# Patient Record
Sex: Female | Born: 1955 | Race: White | Hispanic: No | Smoking: Former smoker
Health system: Southern US, Community
[De-identification: ages and names within clinical notes are randomized; demographics above are authoritative.]

## PROBLEM LIST (undated history)

## (undated) DIAGNOSIS — I639 Cerebral infarction, unspecified: Secondary | ICD-10-CM

## (undated) HISTORY — PX: CHOLECYSTECTOMY: SHX55

---

## 2019-08-09 ENCOUNTER — Inpatient Hospital Stay (HOSPITAL_COMMUNITY): Payer: Self-pay

## 2019-08-09 ENCOUNTER — Inpatient Hospital Stay (HOSPITAL_COMMUNITY)
Admission: AD | Admit: 2019-08-09 | Discharge: 2019-08-12 | DRG: 065 | Disposition: A | Discharge status: Rehabilitation Facility | Payer: Self-pay | Source: Other Acute Inpatient Hospital | Attending: Neurology | Admitting: Neurology

## 2019-08-09 DIAGNOSIS — I959 Hypotension, unspecified: Secondary | ICD-10-CM | POA: Diagnosis present

## 2019-08-09 DIAGNOSIS — I6389 Other cerebral infarction: Principal | ICD-10-CM | POA: Diagnosis present

## 2019-08-09 DIAGNOSIS — W19XXXA Unspecified fall, initial encounter: Secondary | ICD-10-CM | POA: Diagnosis present

## 2019-08-09 DIAGNOSIS — K920 Hematemesis: Secondary | ICD-10-CM | POA: Diagnosis present

## 2019-08-09 DIAGNOSIS — IMO0002 Reserved for concepts with insufficient information to code with codable children: Secondary | ICD-10-CM | POA: Diagnosis present

## 2019-08-09 DIAGNOSIS — S7001XA Contusion of right hip, initial encounter: Secondary | ICD-10-CM | POA: Diagnosis present

## 2019-08-09 DIAGNOSIS — S7011XA Contusion of right thigh, initial encounter: Secondary | ICD-10-CM | POA: Diagnosis present

## 2019-08-09 DIAGNOSIS — G8191 Hemiplegia, unspecified affecting right dominant side: Secondary | ICD-10-CM | POA: Diagnosis present

## 2019-08-09 DIAGNOSIS — I10 Essential (primary) hypertension: Secondary | ICD-10-CM | POA: Diagnosis present

## 2019-08-09 DIAGNOSIS — Z9282 Status post administration of tPA (rtPA) in a different facility within the last 24 hours prior to admission to current facility: Secondary | ICD-10-CM

## 2019-08-09 DIAGNOSIS — Z8673 Personal history of transient ischemic attack (TIA), and cerebral infarction without residual deficits: Secondary | ICD-10-CM

## 2019-08-09 DIAGNOSIS — Z20828 Contact with and (suspected) exposure to other viral communicable diseases: Secondary | ICD-10-CM | POA: Diagnosis present

## 2019-08-09 DIAGNOSIS — E669 Obesity, unspecified: Secondary | ICD-10-CM | POA: Diagnosis present

## 2019-08-09 DIAGNOSIS — Z880 Allergy status to penicillin: Secondary | ICD-10-CM

## 2019-08-09 DIAGNOSIS — Y92009 Unspecified place in unspecified non-institutional (private) residence as the place of occurrence of the external cause: Secondary | ICD-10-CM

## 2019-08-09 DIAGNOSIS — Z23 Encounter for immunization: Secondary | ICD-10-CM

## 2019-08-09 DIAGNOSIS — M1990 Unspecified osteoarthritis, unspecified site: Secondary | ICD-10-CM | POA: Diagnosis present

## 2019-08-09 DIAGNOSIS — Z79899 Other long term (current) drug therapy: Secondary | ICD-10-CM

## 2019-08-09 DIAGNOSIS — Z7982 Long term (current) use of aspirin: Secondary | ICD-10-CM

## 2019-08-09 DIAGNOSIS — R29704 NIHSS score 4: Secondary | ICD-10-CM | POA: Diagnosis present

## 2019-08-09 DIAGNOSIS — I6623 Occlusion and stenosis of bilateral posterior cerebral arteries: Secondary | ICD-10-CM | POA: Diagnosis present

## 2019-08-09 DIAGNOSIS — Z9119 Patient's noncompliance with other medical treatment and regimen: Secondary | ICD-10-CM

## 2019-08-09 DIAGNOSIS — R509 Fever, unspecified: Secondary | ICD-10-CM

## 2019-08-09 DIAGNOSIS — Z6829 Body mass index (BMI) 29.0-29.9, adult: Secondary | ICD-10-CM

## 2019-08-09 DIAGNOSIS — E785 Hyperlipidemia, unspecified: Secondary | ICD-10-CM | POA: Diagnosis present

## 2019-08-09 DIAGNOSIS — E1165 Type 2 diabetes mellitus with hyperglycemia: Secondary | ICD-10-CM | POA: Diagnosis present

## 2019-08-09 DIAGNOSIS — I639 Cerebral infarction, unspecified: Secondary | ICD-10-CM | POA: Diagnosis present

## 2019-08-09 DIAGNOSIS — Z9114 Patient's other noncompliance with medication regimen: Secondary | ICD-10-CM

## 2019-08-09 DIAGNOSIS — R471 Dysarthria and anarthria: Secondary | ICD-10-CM | POA: Diagnosis present

## 2019-08-09 DIAGNOSIS — E0865 Diabetes mellitus due to underlying condition with hyperglycemia: Secondary | ICD-10-CM

## 2019-08-09 DIAGNOSIS — R29706 NIHSS score 6: Secondary | ICD-10-CM | POA: Diagnosis present

## 2019-08-09 LAB — CBC
HCT: 34 % — ABNORMAL LOW (ref 36.0–46.0)
Hemoglobin: 11.9 g/dL — ABNORMAL LOW (ref 12.0–15.0)
MCH: 29.5 pg (ref 26.0–34.0)
MCHC: 35 g/dL (ref 30.0–36.0)
MCV: 84.4 fL (ref 80.0–100.0)
Platelets: 185 10*3/uL (ref 150–400)
RBC: 4.03 MIL/uL (ref 3.87–5.11)
RDW: 12.2 % (ref 11.5–15.5)
WBC: 10.9 10*3/uL — ABNORMAL HIGH (ref 4.0–10.5)
nRBC: 0 % (ref 0.0–0.2)

## 2019-08-09 LAB — COMPREHENSIVE METABOLIC PANEL
ALT: 18 U/L (ref 0–44)
AST: 16 U/L (ref 15–41)
Albumin: 3 g/dL — ABNORMAL LOW (ref 3.5–5.0)
Alkaline Phosphatase: 65 U/L (ref 38–126)
Anion gap: 10 (ref 5–15)
BUN: 19 mg/dL (ref 8–23)
CO2: 24 mmol/L (ref 22–32)
Calcium: 8.3 mg/dL — ABNORMAL LOW (ref 8.9–10.3)
Chloride: 101 mmol/L (ref 98–111)
Creatinine, Ser: 0.66 mg/dL (ref 0.44–1.00)
GFR calc Af Amer: >60 mL/min (ref >60)
GFR calc non Af Amer: >60 mL/min (ref >60)
Glucose, Bld: 250 mg/dL — ABNORMAL HIGH (ref 70–99)
Potassium: 3.7 mmol/L (ref 3.5–5.1)
Sodium: 135 mmol/L (ref 135–145)
Total Bilirubin: 0.9 mg/dL (ref 0.3–1.2)
Total Protein: 5.7 g/dL — ABNORMAL LOW (ref 6.5–8.1)

## 2019-08-09 LAB — URINALYSIS, ROUTINE W REFLEX MICROSCOPIC
Bilirubin Urine: NEGATIVE
Glucose, UA: >=500 mg/dL — AB
Hgb urine dipstick: NEGATIVE
Ketones, ur: NEGATIVE mg/dL
Leukocytes,Ua: NEGATIVE
Nitrite: NEGATIVE
Protein, ur: NEGATIVE mg/dL
Specific Gravity, Urine: 1.023 (ref 1.005–1.030)
pH: 5 (ref 5.0–8.0)

## 2019-08-09 LAB — ECHOCARDIOGRAM COMPLETE
Height: 63 in
Weight: 2701.96 oz

## 2019-08-09 LAB — MRSA PCR SCREENING: MRSA by PCR: NEGATIVE

## 2019-08-09 LAB — GLUCOSE, CAPILLARY
Glucose-Capillary: 268 mg/dL — ABNORMAL HIGH (ref 70–99)
Glucose-Capillary: 257 mg/dL — ABNORMAL HIGH (ref 70–99)
Glucose-Capillary: 287 mg/dL — ABNORMAL HIGH (ref 70–99)
Glucose-Capillary: 231 mg/dL — ABNORMAL HIGH (ref 70–99)
Glucose-Capillary: 320 mg/dL — ABNORMAL HIGH (ref 70–99)

## 2019-08-09 LAB — HEMOGLOBIN AND HEMATOCRIT, BLOOD
HCT: 38.5 % (ref 36.0–46.0)
HCT: 36.5 % (ref 36.0–46.0)
Hemoglobin: 13.5 g/dL (ref 12.0–15.0)
Hemoglobin: 12.4 g/dL (ref 12.0–15.0)

## 2019-08-09 LAB — HEMOGLOBIN A1C
Hgb A1c MFr Bld: 11.8 % — ABNORMAL HIGH (ref 4.8–5.6)
Mean Plasma Glucose: 291.96 mg/dL

## 2019-08-09 LAB — LIPID PANEL
Cholesterol: 147 mg/dL (ref 0–200)
HDL: 28 mg/dL — ABNORMAL LOW (ref >40)
LDL Cholesterol: 103 mg/dL — ABNORMAL HIGH (ref 0–99)
Total CHOL/HDL Ratio: 5.3 RATIO
Triglycerides: 82 mg/dL (ref <150)
VLDL: 16 mg/dL (ref 0–40)

## 2019-08-09 LAB — HIV ANTIBODY (ROUTINE TESTING W REFLEX): HIV Screen 4th Generation wRfx: NONREACTIVE

## 2019-08-09 LAB — SARS CORONAVIRUS 2 (TAT 6-24 HRS): SARS Coronavirus 2: NEGATIVE

## 2019-08-09 IMAGING — MR MR HEAD W/O CM
12 of 14 series · 37 of 48 positions shown · non-contrast
Comparison: None.

CLINICAL DATA: Focal neuro deficit with stroke suspected

EXAM:
MRI HEAD WITHOUT CONTRAST
TECHNIQUE: Multiplanar, multiecho pulse sequences of the brain and surrounding
structures were obtained without intravenous contrast.

[Series 5: DWI · axial · 3.0mm · 0.88mm/px · z∈[-96,+60]mm · 7 of 108 slices shown (1 of 4)]
[im 1/108]
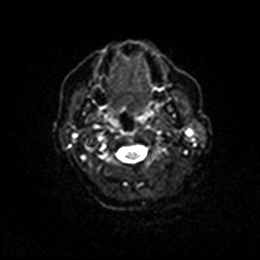
[im 18/108]
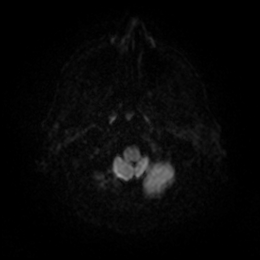
[im 36/108]
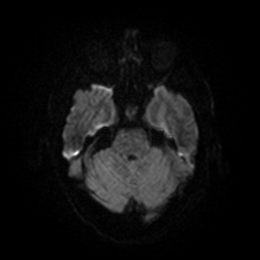
[im 54/108]
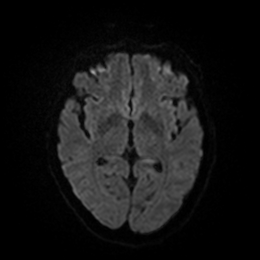
[im 72/108]
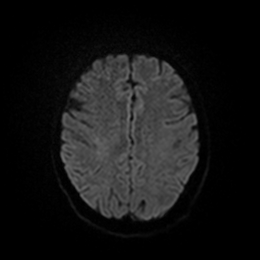
[im 90/108]
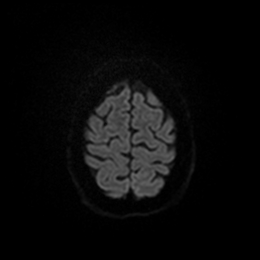
[im 108/108]
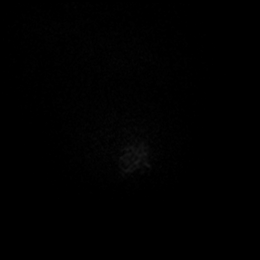

[Series 6: DWI · axial · 3.0mm · 0.88mm/px · z∈[-96,+60]mm · 3 of 54 slices shown (2 of 4)]
[im 1/54]
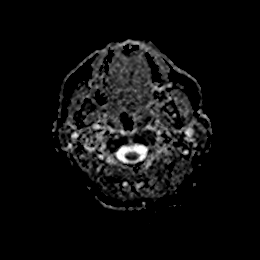
[im 27/54]
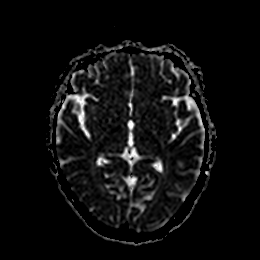
[im 54/54]
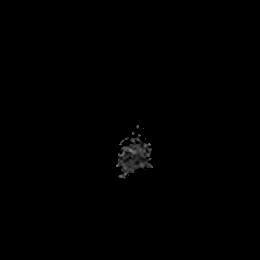

[Series 7: DWI · coronal · 4.0mm · 0.88mm/px · 4 of 72 slices shown (3 of 4)]
[im 1/72]
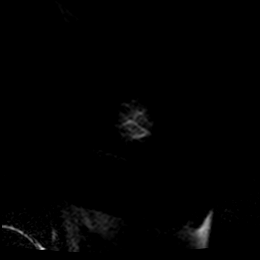
[im 24/72]
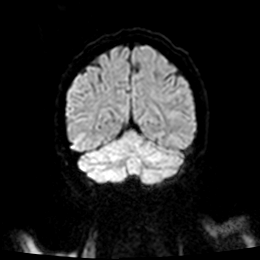
[im 48/72]
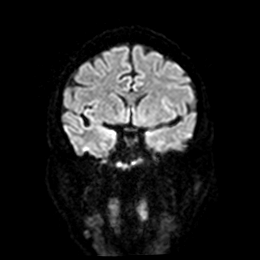
[im 72/72]
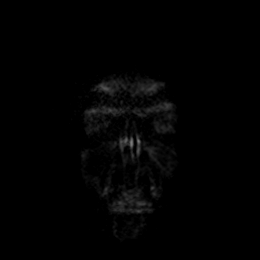

[Series 8: DWI · coronal · 4.0mm · 0.88mm/px · 2 of 36 slices shown (4 of 4)]
[im 1/36]
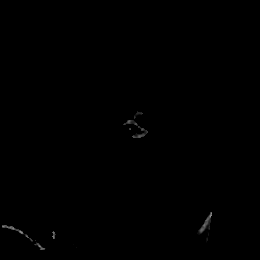
[im 36/36]
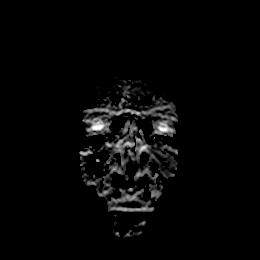

[Series 9: T1 · sagittal · 5.0mm · 0.75mm/px · 1 of 23 slices shown]
[im 1/23]
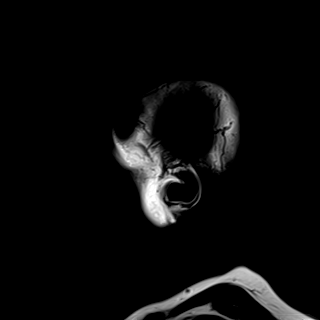

[Series 10: T2 · axial · 5.0mm · 0.72mm/px · z∈[-94,+47]mm · 2 of 25 slices shown (1 of 2)]
[im 1/25]
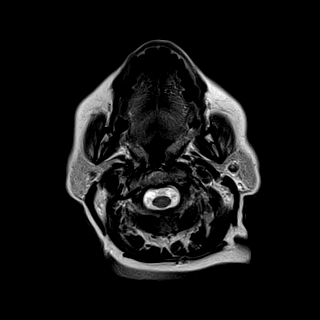
[im 25/25]
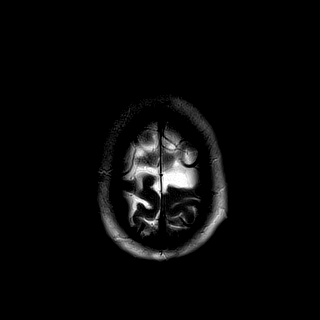

[Series 11: FLAIR · axial · 5.0mm · 0.45mm/px · z∈[-96,+46]mm · 2 of 25 slices shown]
[im 1/25]
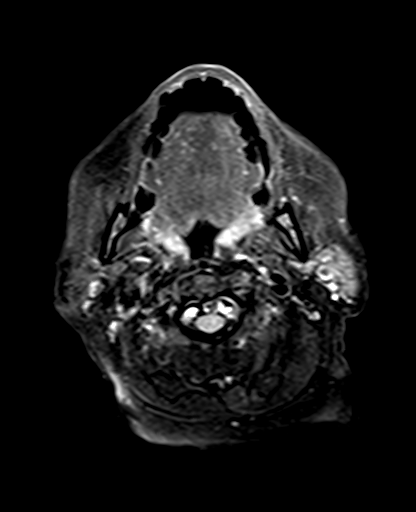
[im 25/25]
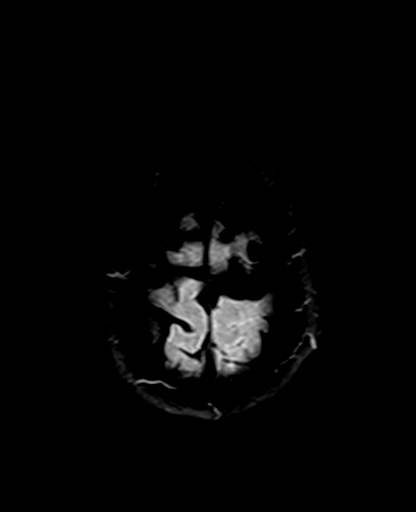

[Series 12: mag_images · axial · 3.0mm · 0.90mm/px · z∈[-100,+74]mm · 4 of 60 slices shown]
[im 1/60]
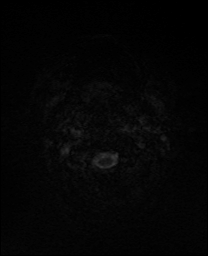
[im 20/60]
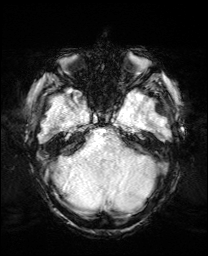
[im 40/60]
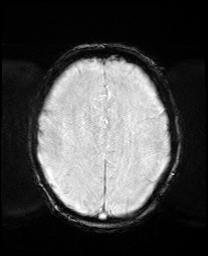
[im 60/60]
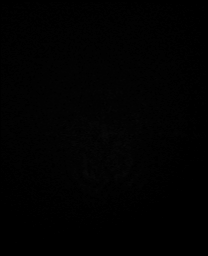

[Series 13: pha_images · axial · 3.0mm · 0.90mm/px · z∈[-100,+65]mm · 3 of 56 slices shown]
[im 1/56]
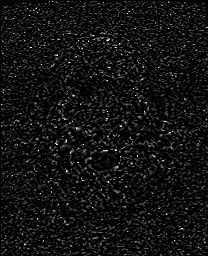
[im 28/56]
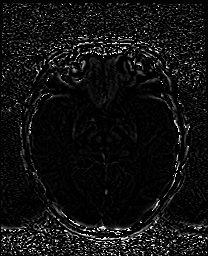
[im 56/56]
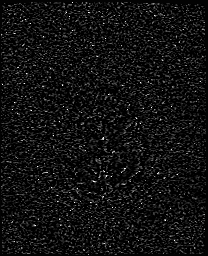

[Series 14: swi_images · axial · 3.0mm · 0.90mm/px · z∈[-100,+74]mm · 4 of 60 slices shown]
[im 1/60]
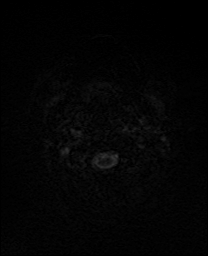
[im 20/60]
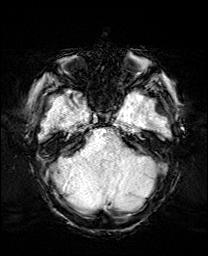
[im 40/60]
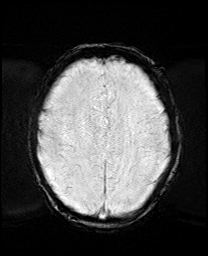
[im 60/60]
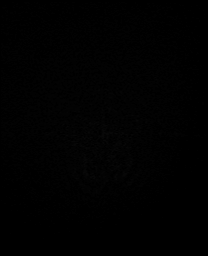

[Series 15: mip_images(sw) · axial · 24.0mm · 0.90mm/px · z∈[-89,+64]mm · 3 of 53 slices shown]
[im 1/53]
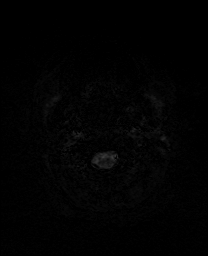
[im 27/53]
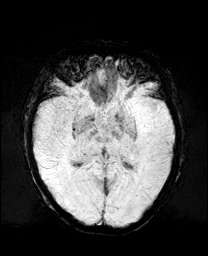
[im 53/53]
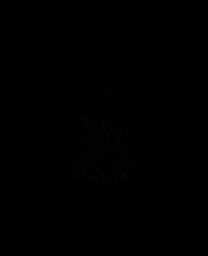

[Series 17: T2 · coronal · 5.0mm · 0.34mm/px · 2 of 29 slices shown (2 of 2)]
[im 1/29]
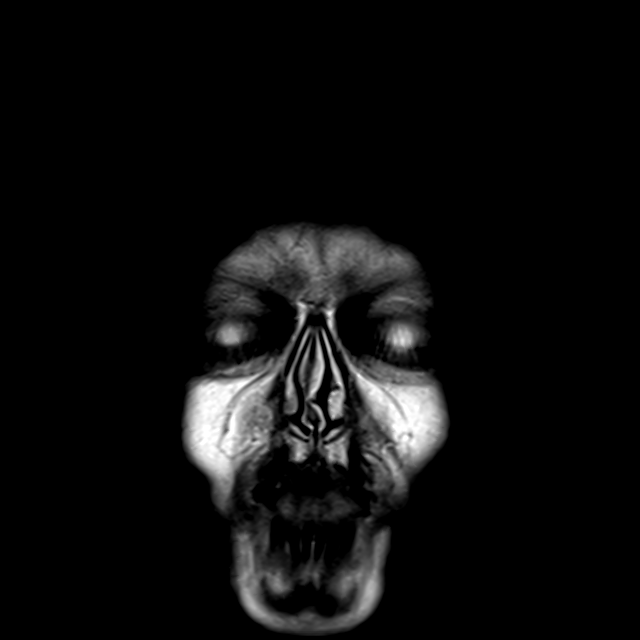
[im 29/29]
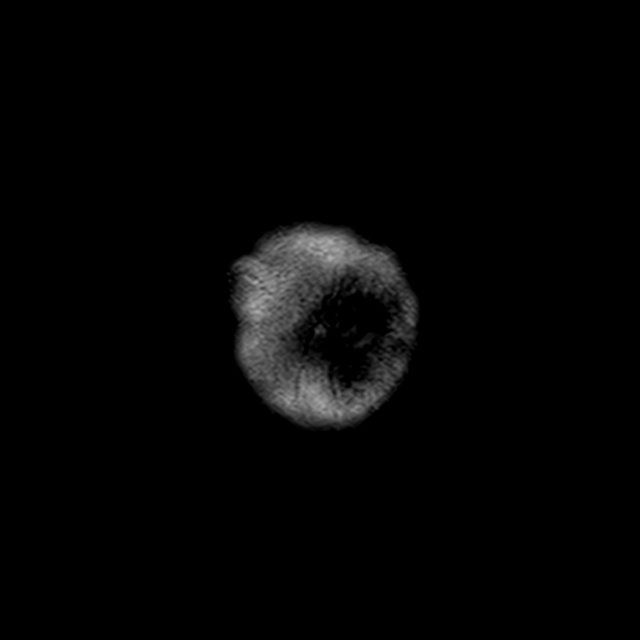

[37 of 48 positions shown; findings below may reference images not displayed]

FINDINGS: Brain: Wedge of restricted diffusion in the left pons consistent
with acute infarct. Remote lacunar infarct in the right frontal
parietal white matter with peripheral facilitated diffusion. Brain
volume is normal. No hemorrhage, hydrocephalus, or masslike finding.

Vascular: Normal flow voids

Skull and upper cervical spine: Normal marrow signal. Cervical facet
spurring with C4-5 facet ankylosis.

Sinuses/Orbits: Normal
IMPRESSION: 1. Left pontine acute perforator infarction.
2. Remote lacunar infarct in the right cerebral white matter.

## 2019-08-09 IMAGING — DX DG CHEST 1V PORT
1 series · 1 of 1 positions shown · non-contrast
Comparison: None.

CLINICAL DATA: History of stroke.  Low-grade fever

EXAM:
PORTABLE CHEST 1 VIEW

[chest]
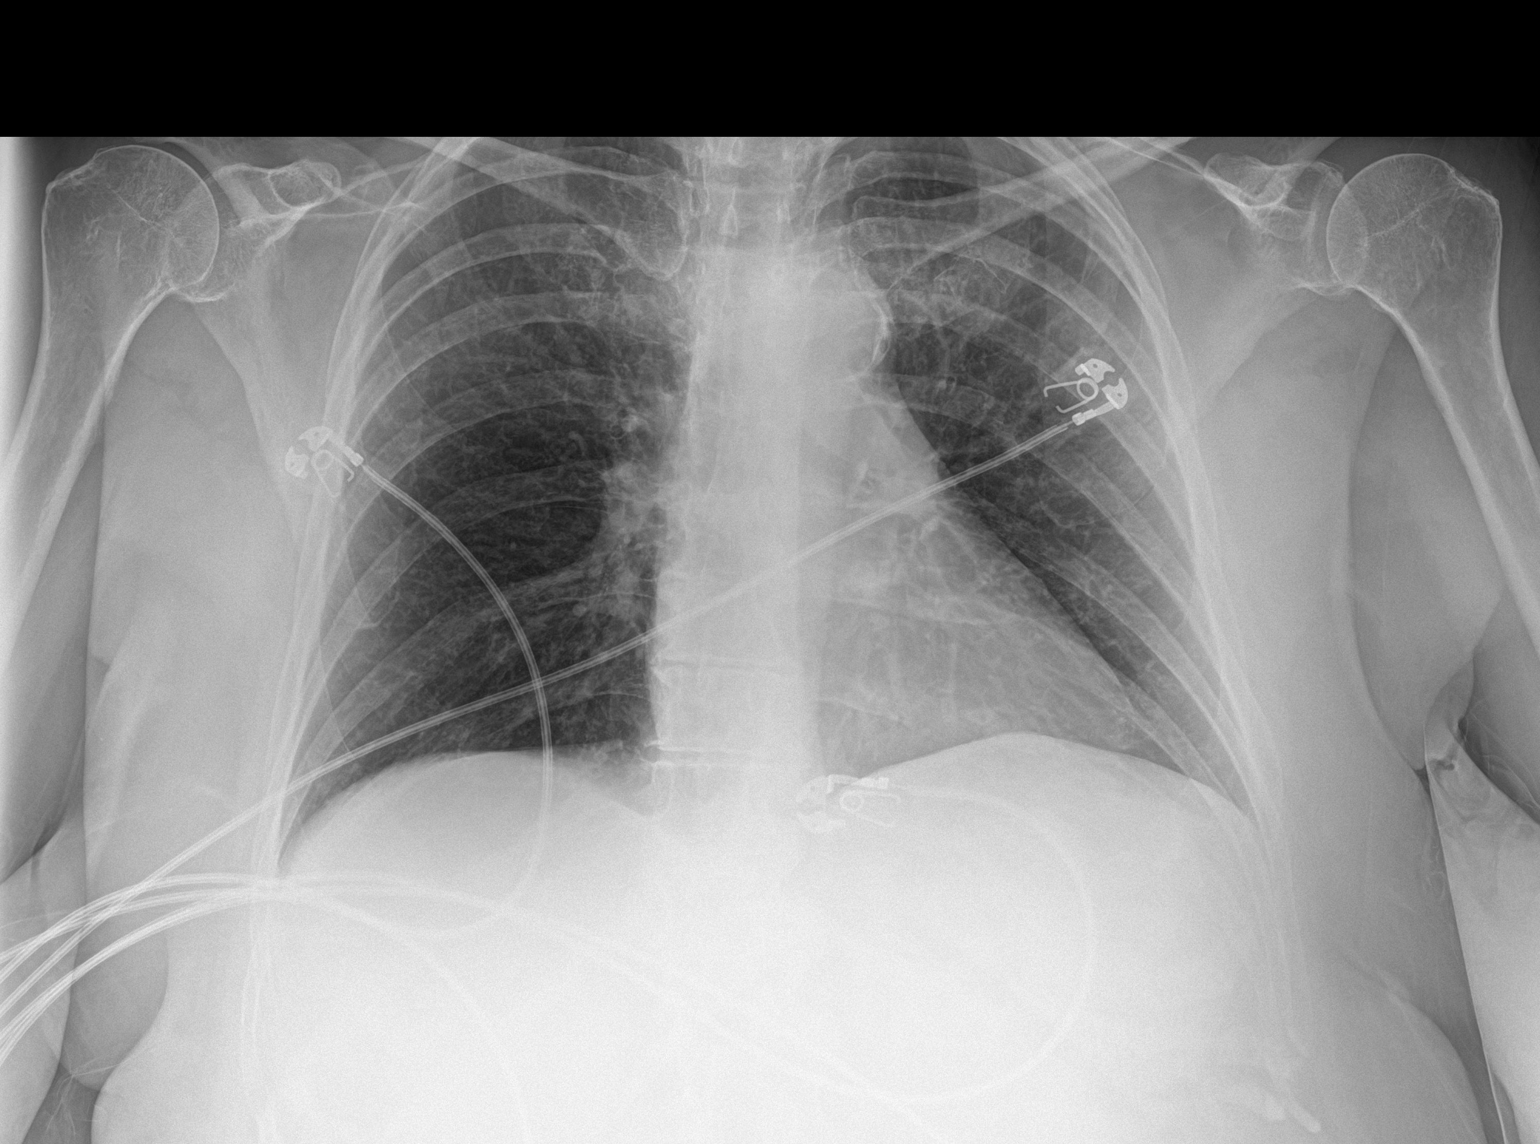

[1 of 1 positions shown; findings below may reference images not displayed]

FINDINGS: Normal heart size. Negative mediastinal contours other than
atherosclerotic calcification. There is no edema, consolidation,
effusion, or pneumothorax. Artifact from EKG leads.
IMPRESSION: No evidence of active disease.

## 2019-08-09 MED ORDER — CLOPIDOGREL BISULFATE 75 MG PO TABS
75.0000 mg | ORAL_TABLET | Freq: Every day | ORAL | Status: DC
  Administered 2019-08-09 – 2019-08-12 (×4): 75 mg via ORAL
  Filled 2019-08-09 (×4): qty 1

## 2019-08-09 MED ORDER — CHLORHEXIDINE GLUCONATE CLOTH 2 % EX PADS
6.0000 | MEDICATED_PAD | Freq: Every day | CUTANEOUS | Status: DC
  Administered 2019-08-09 – 2019-08-12 (×5): 6 via TOPICAL

## 2019-08-09 MED ORDER — PNEUMOCOCCAL VAC POLYVALENT 25 MCG/0.5ML IJ INJ
0.5000 mL | INJECTION | INTRAMUSCULAR | Status: AC
  Administered 2019-08-10: 0.5 mL via INTRAMUSCULAR
  Filled 2019-08-09: qty 0.5

## 2019-08-09 MED ORDER — ACETAMINOPHEN 650 MG RE SUPP: 650.0000 mg | RECTAL | Status: DC | PRN

## 2019-08-09 MED ORDER — ORAL CARE MOUTH RINSE: 15.0000 mL | Freq: Two times a day (BID) | OROMUCOSAL | Status: DC

## 2019-08-09 MED ORDER — INSULIN ASPART 100 UNIT/ML ~~LOC~~ SOLN
5.0000 [IU] | SUBCUTANEOUS | Status: AC
  Administered 2019-08-09: 22:00:00 5 [IU] via SUBCUTANEOUS

## 2019-08-09 MED ORDER — ACETAMINOPHEN 325 MG PO TABS
650.0000 mg | ORAL_TABLET | ORAL | Status: DC | PRN
  Administered 2019-08-09 – 2019-08-11 (×2): 650 mg via ORAL
  Filled 2019-08-09 (×2): qty 2

## 2019-08-09 MED ORDER — INSULIN ASPART 100 UNIT/ML ~~LOC~~ SOLN
0.0000 [IU] | Freq: Three times a day (TID) | SUBCUTANEOUS | Status: DC
  Administered 2019-08-09: 18:00:00 3 [IU] via SUBCUTANEOUS
  Administered 2019-08-09 (×2): 5 [IU] via SUBCUTANEOUS
  Administered 2019-08-10: 3 [IU] via SUBCUTANEOUS

## 2019-08-09 MED ORDER — SODIUM CHLORIDE 0.9 % IV SOLN
INTRAVENOUS | Status: DC
  Administered 2019-08-09 – 2019-08-10 (×2): via INTRAVENOUS

## 2019-08-09 MED ORDER — ASPIRIN 81 MG PO CHEW
81.0000 mg | CHEWABLE_TABLET | Freq: Every day | ORAL | Status: DC
  Administered 2019-08-09 – 2019-08-12 (×4): 81 mg via ORAL
  Filled 2019-08-09 (×4): qty 1

## 2019-08-09 MED ORDER — STROKE: EARLY STAGES OF RECOVERY BOOK
Freq: Once | Status: AC
  Administered 2019-08-09: 02:00:00
  Filled 2019-08-09: qty 1

## 2019-08-09 MED ORDER — ACETAMINOPHEN 160 MG/5ML PO SOLN: 650.0000 mg | ORAL | Status: DC | PRN

## 2019-08-09 MED ORDER — CHLORHEXIDINE GLUCONATE 0.12 % MT SOLN
15.0000 mL | Freq: Two times a day (BID) | OROMUCOSAL | Status: DC
  Administered 2019-08-09: 02:00:00 15 mL via OROMUCOSAL

## 2019-08-09 MED ORDER — SODIUM CHLORIDE 0.9 % IV BOLUS
250.0000 mL | Freq: Once | INTRAVENOUS | Status: AC
  Administered 2019-08-09: 08:00:00 250 mL via INTRAVENOUS

## 2019-08-09 MED ORDER — PANTOPRAZOLE SODIUM 40 MG IV SOLR
40.0000 mg | Freq: Every day | INTRAVENOUS | Status: DC
  Administered 2019-08-09: 22:00:00 40 mg via INTRAVENOUS
  Filled 2019-08-09: qty 40

## 2019-08-09 NOTE — Progress Notes (Signed)
Patient arrived via [facility] to room [phone]. Patient's nurse from [facility] was with the patient and TPA bag was paused but still attached to the patient. TPA bolus initially started at 2218, and then the infusion was started at 2223. The dosing was supposed to be 40mL/hr. Upon investigation at 0023, the paused pump for TPA rate was set for 6.8 mL/hr. Oval Linsey RN mentioned pausing the TPA because patient started vomiting blood and a hematoma formed on Right hip. Hematoma formed around 2300 and was marked to be about 1 inch X 1 inch, TPA was paused the first time at 2301. Oval Linsey MD was made aware and verbally told Lynnell Catalan to continue giving TPA. Patient then became nauseous and was given zofran at 2340. TPA was paused again at 0003 because patient began vomiting large amounts of blood. The hematoma was assessed and marked again at this time by the [facility] and was noted to have drastically grown larger.   [facility], Dr. Lorraine Lax, was paged when patient arrived onto the floor. Spoke with him about the events that occurred before arriving to the unit, verbal order to not continue what was left of the TPA (which was about 20 mL total in the IV chamber and line) because it should have been finished an hour from starting anyway.   0030- hematoma marked again by [facility] RN and [employer] together.

## 2019-08-09 NOTE — Progress Notes (Signed)
Paged Neuro MD about patient's bedtime blood glucose of 320 to see if bedtime coverage would be needed. New order to give 5 units of novalog insulin one time now. Will continue to monitor.

## 2019-08-09 NOTE — Progress Notes (Signed)
  Echocardiogram 2D Echocardiogram has been performed.  Jacqueline Leach 08/09/2019, 11:47 AM

## 2019-08-09 NOTE — Evaluation (Signed)
Clinical/Bedside Swallow Evaluation Patient Details  Name: Jacqueline Leach MRN: [id number] Date of Birth: 12/05/1957  Today's Date: 08/09/2019 Time: SLP Start Time (ACUTE ONLY): 0815 SLP Stop Time (ACUTE ONLY): 0825 SLP Time Calculation (min) (ACUTE ONLY): 10 min  Past Medical History: No past medical history on file. Past Surgical History:  HPI:  Jacqueline Leach is a 63 y.o. female with past medical history significant for arthritis, diabetes mellitus noncompliant with her medications, possibly hypertension presents to the emergency department at [facility] with sudden onset weakness and slurred speech resulting in her falling at 6:30 PM on 08/08/19.  PEr chart, tPA was not able to be fully administered, MRI pending.    Assessment / Plan / Recommendation Clinical Impression  Pt presents with appropriate oropharyngeal abilities. She consumed puree, solids and thin liquids without overt s/s of aspiration or dysphagia. Although pt does demonstrate very mild facial weakness on right, pt with good sensation and is appropriate for return to regular diet with thin liquids, medicine whole. ST to sign off for dysphagia.  SLP Visit Diagnosis: Dysphagia, unspecified (R13.10)    Aspiration Risk  No limitations    Diet Recommendation Regular;Thin liquid   Liquid Administration via: Cup;Straw Medication Administration: Whole meds with liquid Supervision: Patient able to self feed Compensations: Slow rate;Small sips/bites Postural Changes: Seated upright at 90 degrees    Other  Recommendations Oral Care Recommendations: Oral care BID   Follow up Recommendations None      Frequency and Duration   N/A         Prognosis   N/A     Swallow Study   General Date of Onset: 08/09/19 HPI: Jacqueline Leach is a 63 y.o. female with past medical history significant for arthritis, diabetes mellitus noncompliant with her medications, possibly hypertension presents to the emergency department at  [facility] with sudden onset weakness and slurred speech resulting in her falling at 6:30 PM on 08/08/19.  PEr chart, tPA was not able to be fully administered, MRI pending.  Type of Study: Bedside Swallow Evaluation Previous Swallow Assessment: none in chart Diet Prior to this Study: NPO Temperature Spikes Noted: No Respiratory Status: Room air History of Recent Intubation: No Behavior/Cognition: Alert;Cooperative;Pleasant mood Oral Cavity Assessment: Within Functional Limits Oral Care Completed by SLP: Recent completion by staff Oral Cavity - Dentition: Adequate natural dentition Vision: Functional for self-feeding Self-Feeding Abilities: Able to feed self Patient Positioning: Upright in bed Baseline Vocal Quality: Normal Volitional Cough: Strong Volitional Swallow: Able to elicit    Oral/Motor/Sensory Function Overall Oral Motor/Sensory Function: Within functional limits   Ice Chips Ice chips: Not tested   Thin Liquid Thin Liquid: Within functional limits Presentation: Cup;Self Fed;Straw    Nectar Thick Nectar Thick Liquid: Not tested   Honey Thick Honey Thick Liquid: Not tested   Puree Puree: Within functional limits Presentation: Self Fed;Spoon   Solid     Solid: Within functional limits Presentation: Odell 08/09/2019,12:14 PM

## 2019-08-09 NOTE — Evaluation (Signed)
Occupational Therapy Evaluation Patient Details Name: Jacqueline Leach MRN: [id number] DOB: 12/05/1957 Today's Date: 08/09/2019    History of Present Illness 63 y.o. female with past medical history significant for arthritis, diabetes mellitus noncompliant with her medications, possibly hypertension presented to [facility] ED with sudden onset weakness and slurred speech resulting in her falling at 6:30 PM on 08/08/19. tPA given however, instead of infusion being given 62 mL/h TPA rate was set for 6.8 ml/hr, and so patient still had not completed TPA on arrival to [facility].  In addition patient had some hematemesis as well as hematoma on the right leg and therefore TPA was held. CT not ordered, MRI pending Left hemispheric subcortical infarct   Clinical Impression   Pt PTA: pt living at home independently with 2 daughters and grandchildren. Pt still working. Pt currently limited by decreased strength, decreased mobility and decreased ability to care for self. Pt very limited on R side; but L side remains strong to perform transfers. 1/5 MM grade for shoulder elevation minimal and minimal shoulder retraction. emotional lability, very upset over change in function. Pt minA to [place] +2 for sit to stand and stand or squat pivot transfers. Pt modA to maxA for ADL as RUE nearly flaccid and her dominant side. Pt would greatly benefit from continued OT skilled services for ADL, mobility and safety In CIR setting. Pt highly motivated to return to PLOF. OT following acutely.    Follow Up Recommendations  CIR    Equipment Recommendations  3 in 1 bedside commode;Tub/shower seat    Recommendations for Other Services Rehab consult     Precautions / Restrictions Precautions Precautions: Fall Precaution Comments: right hemiparesis Restrictions Weight Bearing Restrictions: No      Mobility Bed Mobility Overal bed mobility: Needs Assistance Bed Mobility: Supine to Sit     Supine to sit: Mod  assist     General bed mobility comments: mod assist to move RLE toward EOB and fully elevate trunk with use of rail and pivot to left  Transfers Overall transfer level: Needs assistance   Transfers: Sit to/from Stand;Stand Pivot Transfers;Squat Pivot Transfers Sit to Stand: Min assist;+2 safety/equipment Stand pivot transfers: Mod assist;+2 safety/equipment;+2 physical assistance Squat pivot transfers: Min assist;Mod assist     General transfer comment: initial stand from bed with RLE blocked pt able to rise with min assist with +2 for safety x 2 trials. Mod +2 assist to stand pivot toward left from bed to chair with physical assist to block RLE and to advance RLE. squat pivot from recliner to [facility] toward left with min assist with cues for sequence. squat pivot to right BSC to recliner with mod assist +2 for safety with physical assist to move RLE to complete pivot    Balance Overall balance assessment: Needs assistance   Sitting balance-Leahy Scale: Good Sitting balance - Comments: good sitting balance EOB     Standing balance-Leahy Scale: Poor Standing balance comment: BUE assist for standing task                           ADL either performed or assessed with clinical judgement   ADL Overall ADL's : Needs assistance/impaired Eating/Feeding: Moderate assistance;Bed level;Cueing for safety Eating/Feeding Details (indicate cue type and reason): using LUE Grooming: Moderate assistance;Sitting;Cueing for safety   Upper Body Bathing: Moderate assistance;Sitting;Cueing for safety   Lower Body Bathing: Maximal assistance;Cueing for safety;Cueing for sequencing;Sitting/lateral leans;Sit to/from stand   Upper Body  Dressing : Moderate assistance;Sitting   Lower Body Dressing: Maximal assistance;Sitting/lateral leans;Sit to/from stand   Toilet Transfer: Minimal assistance;Moderate assistance;+2 for physical assistance;+2 for safety/equipment;Stand-pivot;Squat-pivot;Cueing  for sequencing;Cueing for safety;BSC   Toileting- Clothing Manipulation and Hygiene: Maximal assistance;+2 for physical assistance;+2 for safety/equipment;Sitting/lateral lean;Sit to/from stand;Cueing for safety;Cueing for sequencing       Functional mobility during ADLs: Minimal assistance;Moderate assistance;+2 for physical assistance;+2 for safety/equipment;Cueing for safety;Cueing for sequencing General ADL Comments: Pt limited by decreased strength, decreased mobility and decreased ability to care for self. Pt very limited on R side; but L side remains strong to perform transfers.     Vision Baseline Vision/History: No visual deficits Patient Visual Report: No change from baseline Vision Assessment?: Yes Eye Alignment: Within Functional Limits Ocular Range of Motion: Within Functional Limits Alignment/Gaze Preference: Within Defined Limits     Perception     Praxis      Pertinent Vitals/Pain Pain Assessment: 0-10 Pain Score: 4  Pain Location: hemtoma spot Pain Descriptors / Indicators: Discomfort Pain Intervention(s): Monitored during session;Repositioned     Hand Dominance Right   Extremity/Trunk Assessment Upper Extremity Assessment Upper Extremity Assessment: Generalized weakness;RUE deficits/detail RUE Deficits / Details: 1/5 MM grade for shoulder elevation minimal and minimal shoulder retraction RUE Coordination: decreased fine motor;decreased gross motor   Lower Extremity Assessment Lower Extremity Assessment: Defer to PT evaluation;Generalized weakness RLE Deficits / Details: hip flexion 1/5, knee extension 2/5, knee flexion 2/5, dorsiflexion 1/5, no toe movement   Cervical / Trunk Assessment Cervical / Trunk Assessment: Normal   Communication Communication Communication: Expressive difficulties(increased time for speech)   Cognition Arousal/Alertness: Awake/alert Behavior During Therapy: WFL for tasks assessed/performed Overall Cognitive Status: Within  Functional Limits for tasks assessed                                 General Comments: emotional lability, very upset over change in function   General Comments       Exercises Exercises: Other exercises Other Exercises Other Exercises: RUE P/AAROM (LUE guiding RUE) shoulder through digits; shoulder flex, elbow flex/ext, digit flex/ext.   Shoulder Instructions      Home Living Family/patient expects to be discharged to:: Private residence Living Arrangements: Children Available Help at Discharge: Family;Available 24 hours/day Type of Home: House Home Access: Stairs to enter [employer] of Steps: 2-3 Entrance Stairs-Rails: None Home Layout: One level     Bathroom Shower/Tub: Occupational psychologist: Standard     Home Equipment: Shower seat - built in;Cane - single point      Lives With: Family;Daughter    Prior Functioning/Environment Level of Independence: Independent        Comments: Driving, IADLs, ADLs, cleaning; retired as Merchandiser, retail; pt enjoys reading and enjoys Pharmacist, community Problem List: Decreased strength;Decreased range of motion;Decreased activity tolerance;Impaired balance (sitting and/or standing);Decreased cognition;Decreased coordination;Decreased safety awareness;Impaired UE functional use;Pain      OT Treatment/Interventions: Self-care/ADL training;Therapeutic exercise;Neuromuscular education;Energy conservation;Therapeutic activities;Patient/family education;Balance training    OT Goals(Current goals can be found in the care plan section) Acute Rehab OT Goals Patient Stated Goal: return home, read, work in the flowers OT Goal Formulation: With patient Time For Goal Achievement: 08/23/19 Potential to Achieve Goals: Good ADL Goals Pt Will Perform Eating: with set-up;sitting Pt Will Perform Grooming: with set-up;sitting Pt Will Perform Lower Body Dressing: with min assist;sit to/from stand Pt Will  Transfer to Toilet: with min guard assist;stand pivot transfer;bedside commode Pt Will Perform Toileting - Clothing Manipulation and hygiene: with min assist;sitting/lateral leans;sit to/from stand Pt/caregiver will Perform Home Exercise Program: Increased strength;Right Upper extremity;Increased ROM;With minimal assist  OT Frequency: Min 3X/week   Barriers to D/C:            Co-evaluation PT/OT/SLP Co-Evaluation/Treatment: Yes Reason for Co-Treatment: Complexity of the patient's impairments (multi-system involvement);To address functional/ADL transfers   OT goals addressed during session: ADL's and self-care      AM-PAC OT "6 Clicks" Daily Activity     Outcome Measure Help from another person eating meals?: A Lot Help from another person taking care of personal grooming?: A Lot Help from another person toileting, which includes using toliet, bedpan, or urinal?: Total Help from another person bathing (including washing, rinsing, drying)?: A Lot Help from another person to put on and taking off regular upper body clothing?: A Lot Help from another person to put on and taking off regular lower body clothing?: A Lot 6 Click Score: 11   End of Session Equipment Utilized During Treatment: Gait belt Nurse Communication: Mobility status  Activity Tolerance: Patient tolerated treatment well Patient left: in chair;with call bell/phone within reach;with chair alarm set  OT Visit Diagnosis: Unsteadiness on feet (R26.81);Muscle weakness (generalized) (M62.81);Hemiplegia and hemiparesis;Other symptoms and signs involving cognitive function Hemiplegia - Right/Left: Right Hemiplegia - dominant/non-dominant: Dominant Hemiplegia - caused by: Unspecified                Time: [id number] OT Time Calculation (min): 28 min Charges:  OT General Charges $OT Visit: 1 Visit OT Evaluation $OT Eval High Complexity: 1 High  Darryl Nestle) Marsa Aris OTR/L Acute Rehabilitation Services Pager:  [phone] Office: [phone]   Audie Pinto 08/09/2019, 3:57 PM

## 2019-08-09 NOTE — Progress Notes (Signed)
STROKE TEAM PROGRESS NOTE   HISTORY OF PRESENT ILLNESS (per record) Jacqueline Leach is a 63 y.o. female with past medical history significant for arthritis, diabetes mellitus noncompliant with her medications, possibly hypertension presents to the emergency department at [facility] with sudden onset weakness and slurred speech resulting in her falling at 6:30 PM on 08/08/19.  She was evaluated by EDP and tele-neurology and decision was made to give TPA.  Her NIH stroke scale was 4 for mild right upper extremity weakness and right lower weakness and dysarthria.  However, instead of infusion being given 62 mL/h TPA rate was set for 6.8 ml/hr, and so patient still had not completed TPA on arrival to [facility].  In addition patient had some hematemesis as well as hematoma on the right leg and therefore TPA was held.  CT angiogram was performed as well which showed bilateral PCA stenosis but no large vessel occlusion.  On arrival to [facility], patient's right upper extremity weakness was worse.  Also hematoma in the right leg was larger in size. Patient appeared Alert and oriented and did not appear in distress.  Remaining TPA just approximately 20 mL was thrown out as she is well outside the window to receive TPA at this time.  Date last known well: 11.26.20 Time last known well: 6:30 PM tPA Given: Yes NIHSS: 4 ( 6 on arrival)  Baseline MRS 0   INTERVAL HISTORY I have personally reviewed history of presenting illness with the patient, electronic medical records and imaging films in PACS.  Patient was started on IV TPA at [facility] but developed hematemesis and hence it was discontinued and she got about two thirds the dose.  She also developed right hip hematoma at the site where she had fallen and the onset of her stroke.  Hematoma appears to be stable and shrinking in size overnight.  Her blood pressure has been adequately controlled.  She continues to have significant right  hemiplegia though her dysarthria and slurred speech have improved.    OBJECTIVE Vitals:   08/09/19 0630 08/09/19 0700 08/09/19 0728 08/09/19 0730  BP: (!) 97/54 (!) 77/58 (!) 85/70 99/81  Pulse: 77 88 81 89  Resp: 16 (!) 21 20 17   Temp:      TempSrc:      SpO2: 93% 95% 94% 98%  Weight:      Height:        CBC:  Recent Labs  Lab 08/09/19 0115 08/09/19 0503  HGB 13.5 12.4  HCT 38.5 [id number]    Basic Metabolic Panel: No results for input(s): NA, K, CL, CO2, GLUCOSE, BUN, CREATININE, CALCIUM, MG, PHOS in the last 168 hours.  Lipid Panel:     Component Value Date/Time   CHOL 147 08/09/2019 0503   TRIG 82 08/09/2019 0503   HDL 28 (L) 08/09/2019 0503   CHOLHDL 5.3 08/09/2019 0503   VLDL 16 08/09/2019 0503   LDLCALC 103 (H) 08/09/2019 0503   HgbA1c:  Lab Results  Component Value Date   HGBA1C 11.8 (H) 08/09/2019   Urine Drug Screen: No results found for: LABOPIA, COCAINSCRNUR, LABBENZ, AMPHETMU, THCU, LABBARB  Alcohol Level No results found for: Paint  MRI Head WO Contrast - pending  No results found.   Transthoracic Echocardiogram  00/00/2020 Pending   ECG - pending   PHYSICAL EXAM Blood pressure 99/81, pulse 89, temperature 98 F (36.7 C), temperature source Oral, resp. rate 17, height 5\' 3"  (1.6 m), weight 76.6 kg, SpO2  98 %. Pleasant frail middle-aged Caucasian lady not in distress. . Afebrile. Head is nontraumatic. Neck is supple without bruit.    Cardiac exam no murmur or gallop. Lungs are clear to auscultation. Distal pulses are well felt.  Neurological Exam :  She is awake alert oriented to time place and person.  There is mild dysarthria but no aphasia or apraxia.  Extraocular movements are full range without nystagmus.  Visual fields are full to bedside confrontational testing.  There is mild right lower facial weakness.  Tongue midline.  There is dense right hemiplegia with only trace movement noted in the right leg.  Sensation appears intact  bilaterally.  Tone is increased on the right compared to the left.  Right plantars upgoing left is downgoing.  Gait not tested.  NIH stroke scale 9  ASSESSMENT/PLAN [name] is a 63 y.o. female with history of arthritis, diabetes mellitus noncompliant with her medications, possibly hypertension presents to the emergency department at [facility] with sudden onset right sided weakness and slurred speech resulting in her falling at 6:30 PM on 08/08/19. TPA at [facility] per tele-neurology - D/C'd [facility] due to some hematemesis as well as hematoma on the right leg.   Stroke: Left hemispheric subcortical infarct treated with IV TPA at outside hospital received incomplete dosing due to inaccurate infusion schedule and hematemesis MRI pending  Resultant right hemiplegia  Code Stroke CT Head - not ordered  CT head - not ordered  MRI head - pending  MRA head - not ordered  CTA Head - OSH - bilateral PCA stenosis but no large vessel occlusion.  CT Perfusion - not ordered  Carotid Doppler - pending  2D Echo - pending  Sars Corona Virus 2 - negative  LDL - 103  HgbA1c - 11.8  UDS - not ordered  VTE prophylaxis - SCDs Diet  Diet Order            Diet NPO time specified  Diet effective now              No antithrombotic prior to admission, now on No antithrombotic s/p tPA - hematemesis  Patient will be counseled to be compliant with her antithrombotic medications  Ongoing aggressive stroke risk factor management  Therapy recommendations:  pending  Disposition:  Pending  Hematemesis after tPA  Hemoglobin - 12.4 at 5 AM - stat repeat - pending  Protonix 40 mg IV QD  Hematoma RLE after tPA  Hemoglobin - 12.4 at 5 AM - stat repeat - pending  Hypotension  250 cc NS bolus - for BP 99/81  Hypertension  Home BP meds: none documented  Current BP meds: none   BP running low -> 250 cc NS bolus . Permissive hypertension (OK if < 220/120)  but gradually normalize in 5-7 days   . Long-term BP goal normotensive  Hyperlipidemia  Home Lipid lowering medication: none documented  LDL 103, goal < 70  Current lipid lowering medication: none - NPO - start statin when appropriate  Continue statin at discharge  Diabetes  Home diabetic meds: none documented  Current diabetic meds: SSI   HgbA1c 11.8, goal < 7.0 Recent Labs    08/09/19 0635  GLUCAP 268*    Other Stroke Risk Factors  Advanced age  Cigarette smoker - not on file  ETOH use - not on file  Obesity, Body mass index is 29.91 kg/m., recommend weight loss, diet and exercise as appropriate   Family hx stroke -  not on file  Medical non compliance  Substance Abuse - not on file  Other Active Problems  Medical non compliance    Hospital day # 0  I have personally obtained history,examined this patient, reviewed notes, independently viewed imaging studies, participated in medical decision making and plan of care.ROS completed by me personally and pertinent positives fully documented  I have made any additions or clarifications directly to the above note.  She presented with right hemiplegia and dysarthria secondary to left brain subcortical infarct in was started on IV TPA at [facility] but due to inaccurate calculation of administration schedule did not receive the full dose also developed hematemesis hence TPA was held.  She has remained stable overnight with improvement in slurred speech to right hemiplegia may have worsened.  Blood pressure adequately controlled.  Check MRI scan of the brain later today.  Continue close neurological monitoring and strict blood pressure control as per post TPA protocol.  Continue ongoing stroke work-up.  Physical occupational therapy and rehab consults. This patient is critically ill and at significant risk of neurological worsening, death and care requires constant monitoring of vital signs, hemodynamics,respiratory  and cardiac monitoring, extensive review of multiple databases, frequent neurological assessment, discussion with family, other specialists and medical decision making of high complexity.I have made any additions or clarifications directly to the above note.This critical care time does not reflect procedure time, or teaching time or supervisory time of PA/NP/Med Resident etc but could involve care discussion time.  I spent 30 minutes of neurocritical care time  in the care of  this patient.      Antony Contras, MD Medical Director [facility] Pager: [phone] 08/09/2019 11:36 AM   To contact Stroke Continuity provider, please refer to [email or web address] After hours, contact General Neurology

## 2019-08-09 NOTE — H&P (Signed)
Chief Complaint: right side weakness  History obtained from: Patient and Chart     HPI:                                                                                                                                       Jacqueline Leach is a 63 y.o. female with past medical history significant for arthritis, diabetes mellitus noncompliant with her medications, possibly hypertension presents to the emergency department at [facility] with sudden onset weakness and slurred speech resulting in her falling at 6:30 PM on 08/08/19.  She was evaluated by EDP and tele-neurology and decision was made to give TPA.  Her NIH stroke scale was 4 for mild right upper extremity weakness and right lower weakness and dysarthria.  However, instead of infusion being given 62 mL/h TPA rate was set for 6.8 ml/hr, and so patient still had not completed TPA on arrival to [facility].  In addition patient had some hematemesis as well as hematoma on the right leg and therefore TPA was held.  CT angiogram was performed as well which showed bilateral PCA stenosis but no large vessel occlusion.  On arrival to [facility], patient's right upper extremity weakness was worse.  Also hematoma in the right leg was larger in size. Patient appeared Alert and oriented and did not appear in distress.  Remaining TPA just approximately 20 mL was thrown out as she is well outside the window to receive TPA at this time.  Date last known well: 11.26.20 Time last known well: 6:30 PM tPA Given: Yes NIHSS: 4 ( 6 on arrival)  Baseline MRS 0   Past medical history Arthritis Diabetes mellitus History of gallbladder surgery  No family history on file.  No family history of strokes at young age Social History:  has no history on file for tobacco, alcohol, and drug.  Allergies: Not on File  Medications:                                                                                                                        I  reviewed home medications. None, occasional ASA for arthritis   ROS:  14 systems reviewed and negative except above    Examination:                                                                                                      General: Appears well-developed . Psych: Affect appropriate to situation Eyes: No scleral injection HENT: No OP obstrucion Head: Normocephalic.  Cardiovascular: Normal rate and regular rhythm. Respiratory: Effort normal and breath sounds normal to anterior ascultation GI: Soft.  No distension. There is no tenderness.  Skin: WDI    Neurological Examination Mental Status: Alert, oriented, thought content appropriate.  Speech fluent without evidence of aphasia. Able to follow 3 step commands without difficulty. Cranial Nerves: II: Visual fields grossly normal,  III,IV, VI: ptosis not present, extra-ocular motions intact bilaterally, pupils equal, round, reactive to light and accommodation V,VII: smile symmetric, facial light touch sensation normal bilaterally VIII: hearing normal bilaterally IX,X: uvula rises symmetrically XI: bilateral shoulder shrug XII: midline tongue extension Motor: Right : Upper extremity   2/5    Left:     Upper extremity   5/5  Lower extremity   3/5     Lower extremity   5/5 Tone and bulk:normal tone throughout; no atrophy noted Sensory: Pinprick and light touch intact throughout, bilaterally Deep Tendon Reflexes: 2+ and symmetric throughout Plantars: Right: downgoing   Left: downgoing Cerebellar: normal finger-to-nose on the left side  Gait: Not assessed     Lab Results: Basic Metabolic Panel: No results for input(s): NA, K, CL, CO2, GLUCOSE, BUN, CREATININE, CALCIUM, MG, PHOS in the last 168 hours.  CBC: Recent Labs  Lab 08/09/19 0115 08/09/19 0503  HGB 13.5 12.4  HCT 38.5 36.5     Coagulation Studies: No results for input(s): LABPROT, INR in the last 72 hours.  Imaging: No results found.   ASSESSMENT AND PLAN    Acute Ischemic Stroke   # MRI of the brain without contrast #Transthoracic Echo  #Hold antiplatelets until 24 hours after TPA if no hemorrhage seen on CT/MRI #Start or continue Atorvastatin 40 mg/other high intensity statin after swallow eval # BP goal: permissive HTN upto 180/105  mmHg # HBAIC and Lipid profile # Telemetry monitoring # Frequent neuro checks # stroke swallow screen  Diabetes mellitus HbAIC ordered SS insulin ordered  Right leg hematoma - appears stable, will trend H and H. Will reverse only if increasing in size, Hb drops  This patient is neurologically critically ill due to   He is at risk for significant risk of neurological worsening from cerebral edema,  death from brain herniation, heart failure, hemorrhagic conversion, infection, respiratory failure and seizure. This patient's care requires constant monitoring of vital signs, hemodynamics, respiratory and cardiac monitoring, review of multiple databases, neurological assessment, discussion with family, other specialists and medical decision making of high complexity.  I spent 50 minutes of neurocritical time in the care of this patient.      Please page stroke NP  Or  PA  Or MD from 8am -4 pm  as this patient from this time will be  followed by  the stroke.   You can look them up on [email or web address]  Password [facility]    Wilfredo Canterbury [facility] Pager Number [phone]

## 2019-08-09 NOTE — Evaluation (Signed)
Speech Language Pathology Evaluation Patient Details Name: Jacqueline Leach MRN: [id number] DOB: 12/05/1957 Today's Date: 08/09/2019 Time: [id number] SLP Time Calculation (min) (ACUTE ONLY): 15 min  Problem List:  Patient Active Problem List   Diagnosis Date Noted  . Ischemic stroke ([place]) 08/09/2019   Past Medical History: No past medical history on file. Past Surgical History:  HPI:  Jacqueline Leach is a 63 y.o. female with past medical history significant for arthritis, diabetes mellitus noncompliant with her medications, possibly hypertension presents to the emergency department at [facility] with sudden onset weakness and slurred speech resulting in her falling at 6:30 PM on 08/08/19.  PEr chart, tPA was not able to be fully administered, MRI pending.    Assessment / Plan / Recommendation Clinical Impression  Pt reports functional cognitive abilities at baseline (no family present to confirm). Pt presents with cognitive abilities that are within normal range as evidenced by being oriented x 4, demonstrating good selevite attnetion, awareness of current situation, good recall of current informaiton as well as demosntrating good ability to recall delayed information and complex problem solving. Pt does present with mild right sided facial weakness that results in imprecise articulation at the conversation level. However, pt is already compensating and is fully intelligible at the conversation level (>95%). Anticipate that pt will made full recovery and would benefit from Outpatient or HHST at discharge should her dysarthria continue. ST to sign off.     SLP Assessment  SLP Recommendation/Assessment: All further Speech Lanaguage Pathology  needs can be addressed in the next venue of care SLP Visit Diagnosis: Dysarthria and anarthria (R47.1)    Follow Up Recommendations  Outpatient SLP;Home health SLP  To target very mild dysarthria   Frequency and Duration   N/A        SLP  Evaluation Cognition  Overall Cognitive Status: Within Functional Limits for tasks assessed Arousal/Alertness: Awake/alert Orientation Level: Oriented X4       Comprehension  Auditory Comprehension Overall Auditory Comprehension: Appears within functional limits for tasks assessed Visual Recognition/Discrimination Discrimination: Within Function Limits Reading Comprehension Reading Status: Within funtional limits    Expression Expression Primary Mode of Expression: Verbal Verbal Expression Overall Verbal Expression: Appears within functional limits for tasks assessed Written Expression Dominant Hand: Right Written Expression: Unable to assess (comment)   Oral / Motor  Oral Motor/Sensory Function Overall Oral Motor/Sensory Function: Mild impairment Facial ROM: Reduced right Facial Symmetry: Abnormal symmetry right Facial Strength: Reduced right Facial Sensation: Within Functional Limits Lingual ROM: Reduced right Lingual Symmetry: Within Functional Limits Lingual Strength: Within Functional Limits Lingual Sensation: Within Functional Limits Velum: Within Functional Limits Mandible: Within Functional Limits Motor Speech Overall Motor Speech: Impaired Respiration: Within functional limits Phonation: Normal Resonance: Within functional limits Articulation: Impaired Level of Impairment: Conversation Intelligibility: Intelligible Motor Planning: Witnin functional limits Motor Speech Errors: Not applicable   GO                    Dameon Soltis 08/09/2019, 12:22 PM

## 2019-08-09 NOTE — Evaluation (Addendum)
Physical Therapy Evaluation Patient Details Name: Jacqueline Leach MRN: [id number] DOB: 12/05/1957 Today's Date: 08/09/2019   History of Present Illness  63 y.o. female with past medical history significant for arthritis, diabetes mellitus noncompliant with her medications, possibly hypertension presented to [facility] ED with sudden onset weakness and slurred speech resulting in her falling at 6:30 PM on 08/08/19. tPA given however, instead of infusion being given 62 mL/h TPA rate was set for 6.8 ml/hr, and so patient still had not completed TPA on arrival to [facility].  In addition patient had some hematemesis as well as hematoma on the right leg and therefore TPA was held. CT not ordered, MRI pending Left hemispheric subcortical infarct  Clinical Impression  Pt pleasant but labile with processing of her mobility and functional deficits. Pt with right hemiparesis with very limited activation of right quad and hamstring with decreased transfers, balance, and functional mobility who will benefit from acute therapy to maximize mobility, safety and function to decrease burden of care. Pt educated for stand and squat pivots this session with pt performing squat pivots to left with min assist and encouraged to begin transfers during the day for toileting with nursing staff with RN aware. Pt lives with 2 daughters and 2 grandkids with one daughter available 24hrs/day to assist.     Follow Up Recommendations CIR;Supervision/Assistance - 24 hour    Equipment Recommendations  Other (comment)(TBD)    Recommendations for Other Services Rehab consult     Precautions / Restrictions Precautions Precautions: Fall Precaution Comments: right hemiparesis      Mobility  Bed Mobility Overal bed mobility: Needs Assistance Bed Mobility: Supine to Sit     Supine to sit: Mod assist     General bed mobility comments: mod assist to move RLE toward EOB and fully elevate trunk with use of rail and  pivot to left  Transfers Overall transfer level: Needs assistance   Transfers: Sit to/from Stand;Stand Pivot Transfers;Squat Pivot Transfers Sit to Stand: Min assist;+2 safety/equipment Stand pivot transfers: Mod assist;+2 safety/equipment;+2 physical assistance Squat pivot transfers: Min assist;Mod assist     General transfer comment: initial stand from bed with RLE blocked pt able to rise with min assist with +2 for safety x 2 trials. Mod +2 assist to stand pivot toward left from bed to chair with physical assist to block RLE and to advance RLE. squat pivot from recliner to [facility] toward left with min assist with cues for sequence. squat pivot to right BSC to recliner with mod assist +2 for safety with physical assist to move RLE to complete pivot  Ambulation/Gait             General Gait Details: not yet ready  Stairs            Wheelchair Mobility    Modified Rankin (Stroke Patients Only) Modified Rankin (Stroke Patients Only) Pre-Morbid Rankin Score: No symptoms Modified Rankin: Severe disability     Balance Overall balance assessment: Needs assistance   Sitting balance-Leahy Scale: Good Sitting balance - Comments: good sitting balance EOB     Standing balance-Leahy Scale: Poor Standing balance comment: physical assist to maintain standing                             Pertinent Vitals/Pain Pain Assessment: No/denies pain    Home Living Family/patient expects to be discharged to:: Private residence Living Arrangements: Children Available Help at Discharge: Family;Available 24 hours/day(2 Daughters  and 2 grandkids) Type of Home: House Home Access: Stairs to enter Entrance Stairs-Rails: None Entrance Stairs-Number of Steps: 2-3 Home Layout: One level Home Equipment: Shower seat - built in;Cane - single point      Prior Function Level of Independence: Independent         Comments: Driving, IADLs, ADLs, cleaning; retired as Merchandiser, retail; pt  enjoys reading and enjoys Adult nurse   Dominant Hand: Right    Extremity/Trunk Assessment   Upper Extremity Assessment Upper Extremity Assessment: Defer to OT evaluation    Lower Extremity Assessment Lower Extremity Assessment: RLE deficits/detail RLE Deficits / Details: hip flexion 1/5, knee extension 2/5, knee flexion 2/5, dorsiflexion 1/5, no toe movement    Cervical / Trunk Assessment Cervical / Trunk Assessment: Normal  Communication   Communication: Expressive difficulties  Cognition Arousal/Alertness: Awake/alert Behavior During Therapy: WFL for tasks assessed/performed Overall Cognitive Status: Within Functional Limits for tasks assessed                                 General Comments: emotional lability, very upset over change in function      General Comments      Exercises     Assessment/Plan    PT Assessment Patient needs continued PT services  PT Problem List Decreased strength;Decreased mobility;Decreased safety awareness;Decreased activity tolerance;Decreased balance;Decreased knowledge of use of DME;Decreased coordination       PT Treatment Interventions Gait training;Balance training;Functional mobility training;Cognitive remediation;Therapeutic activities;Patient/family education;Stair training;Neuromuscular re-education;DME instruction;Therapeutic exercise    PT Goals (Current goals can be found in the Care Plan section)  Acute Rehab PT Goals Patient Stated Goal: return home, read, work in the flowers PT Goal Formulation: With patient Time For Goal Achievement: 08/23/19 Potential to Achieve Goals: Fair    Frequency Min 4X/week   Barriers to discharge        Co-evaluation               AM-PAC PT "6 Clicks" Mobility  Outcome Measure Help needed turning from your back to your side while in a flat bed without using bedrails?: A Lot Help needed moving from lying on your back to sitting on the side  of a flat bed without using bedrails?: A Lot Help needed moving to and from a bed to a chair (including a wheelchair)?: A Lot Help needed standing up from a chair using your arms (e.g., wheelchair or bedside chair)?: A Little Help needed to walk in hospital room?: Total Help needed climbing 3-5 steps with a railing? : Total 6 Click Score: 11    End of Session Equipment Utilized During Treatment: Gait belt Activity Tolerance: Patient tolerated treatment well Patient left: in chair;with call bell/phone within reach;with chair alarm set;with nursing/sitter in room Nurse Communication: Mobility status;Precautions PT Visit Diagnosis: Other abnormalities of gait and mobility (R26.89);Hemiplegia and hemiparesis Hemiplegia - Right/Left: Right Hemiplegia - dominant/non-dominant: Dominant Hemiplegia - caused by: Cerebral infarction    Time: 1032-1058 PT Time Calculation (min) (ACUTE ONLY): 26 min   Charges:   PT Evaluation $PT Eval Moderate Complexity: 1 Mod          Nahomy Limburg P, PT Acute Rehabilitation Services Pager: [phone] Office: [phone]   Garyn Waguespack B Ane Conerly 08/09/2019, 1:03 PM

## 2019-08-09 NOTE — Progress Notes (Signed)
Rehab Admissions Coordinator Note:  Per PT/OT recommendation, patient was screened by Michel Santee for appropriateness for an Inpatient Acute Rehab Consult.  At this time, we are recommending Inpatient Rehab consult.  I will place a consult order so that we are better able to assess pt's candidacy for CIR.   Michel Santee 08/09/2019, 4:19 PM  I can be reached at [phone].

## 2019-08-09 NOTE — Progress Notes (Signed)
PT Cancellation Note  Patient Details Name: Jacqueline Leach MRN: [id number] DOB: 12/05/1957   Cancelled Treatment:    Reason Eval/Treat Not Completed: Active bedrest order   Sandy Salaam Rochel Privett 08/09/2019, 7:37 AM  Bayard Males, PT Acute Rehabilitation Services Pager: [phone] Office: [phone]

## 2019-08-09 NOTE — Progress Notes (Signed)
Carotid duplex  has been completed. Refer to [facility] under chart review to view preliminary results.   08/09/2019  12:57 PM Shalee Paolo, Bonnye Fava

## 2019-08-10 DIAGNOSIS — I6302 Cerebral infarction due to thrombosis of basilar artery: Secondary | ICD-10-CM

## 2019-08-10 DIAGNOSIS — E785 Hyperlipidemia, unspecified: Secondary | ICD-10-CM

## 2019-08-10 DIAGNOSIS — E1165 Type 2 diabetes mellitus with hyperglycemia: Secondary | ICD-10-CM

## 2019-08-10 DIAGNOSIS — I10 Essential (primary) hypertension: Secondary | ICD-10-CM

## 2019-08-10 LAB — RAPID URINE DRUG SCREEN, HOSP PERFORMED
Amphetamines: NOT DETECTED
Barbiturates: NOT DETECTED
Benzodiazepines: NOT DETECTED
Cocaine: NOT DETECTED
Opiates: NOT DETECTED
Tetrahydrocannabinol: NOT DETECTED

## 2019-08-10 LAB — GLUCOSE, CAPILLARY
Glucose-Capillary: 233 mg/dL — ABNORMAL HIGH (ref 70–99)
Glucose-Capillary: 182 mg/dL — ABNORMAL HIGH (ref 70–99)
Glucose-Capillary: 243 mg/dL — ABNORMAL HIGH (ref 70–99)
Glucose-Capillary: 189 mg/dL — ABNORMAL HIGH (ref 70–99)

## 2019-08-10 MED ORDER — INSULIN ASPART 100 UNIT/ML ~~LOC~~ SOLN
0.0000 [IU] | Freq: Three times a day (TID) | SUBCUTANEOUS | Status: DC
  Administered 2019-08-10: 17:00:00 5 [IU] via SUBCUTANEOUS
  Administered 2019-08-10: 12:00:00 3 [IU] via SUBCUTANEOUS
  Administered 2019-08-11: 16:00:00 11 [IU] via SUBCUTANEOUS
  Administered 2019-08-11: 8 [IU] via SUBCUTANEOUS
  Administered 2019-08-11 – 2019-08-12 (×2): 5 [IU] via SUBCUTANEOUS
  Administered 2019-08-12: 09:00:00 8 [IU] via SUBCUTANEOUS

## 2019-08-10 MED ORDER — ATORVASTATIN CALCIUM 80 MG PO TABS
80.0000 mg | ORAL_TABLET | Freq: Every day | ORAL | Status: DC
  Administered 2019-08-10 – 2019-08-11 (×2): 80 mg via ORAL
  Filled 2019-08-10 (×2): qty 1

## 2019-08-10 MED ORDER — PANTOPRAZOLE SODIUM 40 MG PO TBEC
40.0000 mg | DELAYED_RELEASE_TABLET | Freq: Every day | ORAL | Status: DC
  Administered 2019-08-10 – 2019-08-12 (×3): 40 mg via ORAL
  Filled 2019-08-10 (×3): qty 1

## 2019-08-10 MED ORDER — ENOXAPARIN SODIUM 40 MG/0.4ML ~~LOC~~ SOLN
40.0000 mg | SUBCUTANEOUS | Status: DC
  Administered 2019-08-10 – 2019-08-12 (×3): 40 mg via SUBCUTANEOUS
  Filled 2019-08-10 (×3): qty 0.4

## 2019-08-10 MED ORDER — METFORMIN HCL 500 MG PO TABS
500.0000 mg | ORAL_TABLET | Freq: Two times a day (BID) | ORAL | Status: DC
  Administered 2019-08-10 – 2019-08-12 (×4): 500 mg via ORAL
  Filled 2019-08-10 (×4): qty 1

## 2019-08-10 MED ORDER — INSULIN ASPART 100 UNIT/ML ~~LOC~~ SOLN
0.0000 [IU] | Freq: Every day | SUBCUTANEOUS | Status: DC
  Administered 2019-08-11: 2 [IU] via SUBCUTANEOUS

## 2019-08-10 NOTE — Progress Notes (Signed)
Inpatient Diabetes Program Recommendations  AACE/ADA: New Consensus Statement on Inpatient Glycemic Control (2015)  Target Ranges:  Prepandial:   less than 140 mg/dL      Peak postprandial:   less than 180 mg/dL (1-2 hours)      Critically ill patients:  140 - 180 mg/dL   Lab Results  Component Value Date   GLUCAP 233 (H) 08/10/2019   HGBA1C 11.8 (H) 08/09/2019    Review of Glycemic Control  Diabetes history: type 2 Outpatient Diabetes medications: none listed Current orders for Inpatient glycemic control: Novolog MODERATE correction scale TID & HS, Metformin 500 mg BID  Inpatient Diabetes Program Recommendations:   Received diabetes coordinator consult. Noted that patient does not have any diabetes medications listed in med rec.   Note that HgbA1C is 11.8%. CBGs have been in the 200's and 300's. Agree with Novolog MODERATE correction scale as ordered. If blood sugars continue to be greater than 180 mg/dl., recommend adding Lantus 10 units daily. Will continue to monitor blood sugars while in the hospital. Will talk with patient when appropriate.   Harvel Ricks RN BSN CDE Diabetes Coordinator Pager: [phone]  8am-5pm

## 2019-08-10 NOTE — Progress Notes (Signed)
STROKE TEAM PROGRESS NOTE   INTERVAL HISTORY RN at bedside. Pt is sitting in bed, eating breakfast. Still has right hemiplegia. MRI showed left pontine infarct. Pt admitted that she did not take DM meds for her DM, she does not know why. She still has hyperglycemia since admission.   OBJECTIVE Vitals:   08/10/19 0400 08/10/19 0500 08/10/19 0600 08/10/19 0700  BP: (!) 115/54 (!) 131/57 (!) 144/55 (!) 127/55  Pulse: 72 76 75 74  Resp: 17 16 17 18   Temp: 98.7 F (37.1 C)     TempSrc: Oral     SpO2: 94% 92% 92% 94%  Weight:      Height:        CBC:  Recent Labs  Lab 08/09/19 0503 08/09/19 0815  WBC  --  10.9*  HGB 12.4 11.9*  HCT 36.5 34.0*  MCV  --  84.4  PLT  --  [id number]    Basic Metabolic Panel:  Recent Labs  Lab 08/09/19 0815  NA 135  K 3.7  CL 101  CO2 24  GLUCOSE 250*  BUN 19  CREATININE 0.66  CALCIUM 8.3*    Lipid Panel:     Component Value Date/Time   CHOL 147 08/09/2019 0503   TRIG 82 08/09/2019 0503   HDL 28 (L) 08/09/2019 0503   CHOLHDL 5.3 08/09/2019 0503   VLDL 16 08/09/2019 0503   LDLCALC 103 (H) 08/09/2019 0503   HgbA1c:  Lab Results  Component Value Date   HGBA1C 11.8 (H) 08/09/2019   Urine Drug Screen: No results found for: LABOPIA, COCAINSCRNUR, LABBENZ, AMPHETMU, THCU, LABBARB  Alcohol Level No results found for: [facility]  IMAGING  CTA head and neck 1. No emergent large vessel occlusion. 2. 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 Right PCA fetal origin. 3. Severe stenosis of the left PCA distal P1 segment. 4. Mild aortic Atherosclerosis (ICD10-I70.0).  CT head - normal   Mr Brain Wo Contrast  Result Date: 08/09/2019 CLINICAL DATA:  Focal neuro deficit with stroke suspected EXAM: MRI HEAD WITHOUT CONTRAST TECHNIQUE: Multiplanar, multiecho pulse sequences of the brain and surrounding structures were obtained without intravenous contrast. COMPARISON:  None. FINDINGS: Brain: Wedge of restricted diffusion in  the left pons consistent with acute infarct. Remote lacunar infarct in the right frontal parietal white matter with peripheral facilitated diffusion. Brain volume is normal. No hemorrhage, hydrocephalus, or masslike finding. Vascular: Normal flow voids Skull and upper cervical spine: Normal marrow signal. Cervical facet spurring with C4-5 facet ankylosis. Sinuses/Orbits: Normal IMPRESSION: 1. Left pontine acute perforator infarction. 2. Remote lacunar infarct in the right cerebral white matter. Electronically Signed   By: Monte Fantasia M.D.   On: 08/09/2019 17:13   Dg Chest Port 1 View  Result Date: 08/09/2019 CLINICAL DATA:  History of stroke.  Low-grade fever EXAM: PORTABLE CHEST 1 VIEW COMPARISON:  None. FINDINGS: Normal heart size. Negative mediastinal contours other than atherosclerotic calcification. There is no edema, consolidation, effusion, or pneumothorax. Artifact from EKG leads. IMPRESSION: No evidence of active disease. Electronically Signed   By: Monte Fantasia M.D.   On: 08/09/2019 18:57   Vas US Carotid  Result Date: 08/09/2019 Carotid Arterial Duplex Study Indications:       CVA and Speech disturbance. Risk Factors:      Hypertension, Diabetes. Comparison Study:  No priors Performing Technologist: Velva Harman Sturdivant RDMS, RVT  Examination Guidelines: A complete evaluation includes B-mode imaging, spectral Doppler, color Doppler, and power Doppler as needed of all  accessible portions of each vessel. Bilateral testing is considered an integral part of a complete examination. Limited examinations for reoccurring indications may be performed as noted.  Right Carotid Findings: +----------+--------+--------+--------+------------------+------------------+           PSV cm/sEDV cm/sStenosisPlaque DescriptionComments           +----------+--------+--------+--------+------------------+------------------+ CCA Prox  109     28                                                    +----------+--------+--------+--------+------------------+------------------+ CCA Distal103     28                                                   +----------+--------+--------+--------+------------------+------------------+ ICA Prox  113     41      1-39%   heterogenous      Upper end of range +----------+--------+--------+--------+------------------+------------------+ ICA Mid   104     37      1-39%                                        +----------+--------+--------+--------+------------------+------------------+ ICA Distal131     48                                tortuous           +----------+--------+--------+--------+------------------+------------------+ ECA       119     18                                                   +----------+--------+--------+--------+------------------+------------------+ +----------+--------+-------+----------------+-------------------+           PSV cm/sEDV cmsDescribe        Arm Pressure (mmHG) +----------+--------+-------+----------------+-------------------+ [id number]            Multiphasic, WNL                    +----------+--------+-------+----------------+-------------------+ +---------+--------+--+--------+--+---------+ VertebralPSV cm/s65EDV cm/s22Antegrade +---------+--------+--+--------+--+---------+  Left Carotid Findings: +----------+--------+--------+--------+------------------+--------+           PSV cm/sEDV cm/sStenosisPlaque DescriptionComments +----------+--------+--------+--------+------------------+--------+ CCA Prox  139     30                                tortuous +----------+--------+--------+--------+------------------+--------+ CCA Distal100     31                                         +----------+--------+--------+--------+------------------+--------+ ICA Prox  103     39      1-39%   heterogenous                +----------+--------+--------+--------+------------------+--------+ ICA Distal112     24                                         +----------+--------+--------+--------+------------------+--------+  ECA       121     17                                         +----------+--------+--------+--------+------------------+--------+ +----------+--------+--------+----------------+-------------------+           PSV cm/sEDV cm/sDescribe        Arm Pressure (mmHG) +----------+--------+--------+----------------+-------------------+ [id number]             Multiphasic, WNL                    +----------+--------+--------+----------------+-------------------+ +---------+--------+--+--------+--+---------+ VertebralPSV cm/s53EDV cm/s20Antegrade +---------+--------+--+--------+--+---------+  Summary: Right Carotid: Velocities in the right ICA are consistent with a 1-39% stenosis. Left Carotid: Velocities in the left ICA are consistent with a 1-39% stenosis. Vertebrals: Bilateral vertebral arteries demonstrate antegrade flow. *See table(s) above for measurements and observations.     Preliminary      Transthoracic Echocardiogram  08/09/19 IMPRESSIONS  1. Left ventricular ejection fraction, by visual estimation, is 60 to 65%. The left ventricle has normal function. There is no left ventricular hypertrophy.  2. Left ventricular diastolic parameters are consistent with Grade I diastolic dysfunction (impaired relaxation).  3. Global right ventricle has normal systolic function.The right ventricular size is normal.  4. Left atrial size was normal.  5. Right atrial size was normal.  6. Mild mitral annular calcification.  7. The mitral valve is normal in structure. Trace mitral valve regurgitation. No evidence of mitral stenosis.  8. The tricuspid valve is normal in structure. Tricuspid valve regurgitation is mild.  9. The aortic valve is tricuspid. Aortic valve regurgitation is not  visualized. Mild aortic valve sclerosis without stenosis. 10. The pulmonic valve was normal in structure. Pulmonic valve regurgitation is not visualized. 11. The inferior vena cava is normal in size with greater than 50% respiratory variability, suggesting right atrial pressure of 3 mmHg. 12. Normal LV systolic function; grade 1 diastolic dysfunction.   ECG - SR rate 79  BPM. (See cardiology reading for complete details)  PHYSICAL EXAM  Temp:  [98.5 F (36.9 C)-99.2 F (37.3 C)] 98.5 F (36.9 C) (11/28 0758) Pulse Rate:  [65-93] 75 (11/28 0800) Resp:  [16-26] 20 (11/28 0800) BP: (102-144)/(53-99) 109/92 (11/28 0800) SpO2:  [92 %-99 %] 95 % (11/28 0800)  General - Well nourished, well developed, in no apparent distress.  Ophthalmologic - fundi not visualized due to noncooperation.  Cardiovascular - Regular rhythm and rate.  Mental Status -  Level of arousal and orientation to time, place, and person were intact. Language including expression, naming, repetition, comprehension was assessed and found intact. Fund of Knowledge was assessed and was intact.  Cranial Nerves II - XII - II - Visual field intact OU. III, IV, VI - Extraocular movements intact. V - Facial sensation intact bilaterally. VII - mild right facial droop. VIII - Hearing & vestibular intact bilaterally. X - Palate elevates symmetrically. XI - Chin turning & shoulder shrug intact bilaterally. XII - Tongue protrusion intact.  Motor Strength - The patient's strength was normal in LUE and LLE, however, RUE 2/5 distal 2/5 proximal, RLE proximal 2/5 and distal 0/5.  Bulk was normal and fasciculations were absent.   Motor Tone - Muscle tone was assessed at the neck and appendages and was normal.  Reflexes - The patient's reflexes were decreased on the RUE and RLE and she had no pathological reflexes.  Sensory -  Light touch, temperature/pinprick were assessed and were symmetrical.    Coordination - The patient had  normal movements in the left hand with no ataxia or dysmetria.  Tremor was absent.  Gait and Station - deferred.   ASSESSMENT/PLAN Ms. [place] Couser is a 63 y.o. female with history of arthritis, diabetes mellitus noncompliant with her medications, possibly hypertension presents to the emergency department at [facility] with sudden onset right sided weakness and slurred speech resulting in her falling at 6:30 PM on 08/08/19. TPA at [facility] per tele-neurology - D/C'd at [facility] due to some hematemesis as well as hematoma on the right leg.  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 Remote lacunar infarct in the right cerebral white matter.  CTA Head - OSH - bilateral PCA high-grade stenosis but no large vessel occlusion.  Carotid Doppler - unremarkable  2D Echo - EF 60 - 65%  Sars Corona Virus 2 - negative  LDL - 103  HgbA1c - 11.8  VTE prophylaxis -Lovenox  No antithrombotic prior to admission, now on ASA 81 mg daily and Plavix 75 mg daily.  Continue DAPT for 3 weeks and then aspirin alone.  Patient was counseled to be compliant with her antithrombotic medications  Ongoing aggressive stroke risk factor management  Therapy recommendations:  CIR  Disposition:  Pending  Hematemesis and hematoma R thigh after tPA  Hemoglobin - 12.4 at 5 AM - stat repeat -> 11.9  Protonix 40 mg IV QD  Hematemesis resolved  Right thigh hematoma stable  Hypertension  Home BP meds: none  Current BP meds: none   BP running low -> 250 cc NS bolus . Permissive hypertension (OK if <180/105) but gradually normalize in 5-7 days   . Long-term BP goal normotensive  Hyperlipidemia  Home Lipid lowering medication: none  LDL 103, goal <  70  Current lipid lowering medication: On Lipitor 80 mg daily  Continue statin at discharge  Diabetes  Home diabetic meds: none  SSI  CBG monitoring  HgbA1c 11.8, goal < 7.0  Continue to be hyperglycemic  Add Metformin  DM coordinator consult placed  Other Stroke Risk Factors  Advanced age  Obesity, Body mass index is 29.91 kg/m., recommend weight loss, diet and exercise as appropriate   Medical non compliance  Other Active Problems     Hospital day # 1  This patient is critically ill due to stroke status post TPA, hyperglycemia, hematemesis post TPA and at significant risk of neurological worsening, death form recurrent stroke, hemorrhagic conversion, GI bleeding, seizure, DKA. This patient's care requires constant monitoring of vital signs, hemodynamics, respiratory and cardiac monitoring, review of multiple databases, neurological assessment, discussion with family, other specialists and medical decision making of high complexity. I spent 35 minutes of neurocritical care time in the care of this patient.  Rosalin Hawking, MD PhD Stroke Neurology 08/10/2019 4:28 PM    To contact Stroke Continuity provider, please refer to [email or web address] After hours, contact General Neurology

## 2019-08-10 NOTE — Progress Notes (Signed)
Physical Therapy Treatment Patient Details Name: Jacqueline Leach MRN: [id number] DOB: 12/05/1957 Today's Date: 08/10/2019    History of Present Illness 63 y.o. female with past medical history significant for arthritis, diabetes mellitus noncompliant with her medications, possibly hypertension presented to [facility] ED with sudden onset weakness and slurred speech resulting in her falling at 6:30 PM on 08/08/19. tPA given however, instead of infusion being given 62 mL/h TPA rate was set for 6.8 ml/hr, and so patient still had not completed TPA on arrival to [facility].  In addition patient had some hematemesis as well as hematoma on the right leg and therefore TPA was held. CT not ordered, MRI pending Left hemispheric subcortical infarct    PT Comments    Notable improvements.  Synergistic movement in large muscle group in both upper and lower extremities.  Emphasis on transitions to EOB, sit to stand and pre-gait in the RW plus gait training to walk to the recliner.    Follow Up Recommendations  CIR;Supervision/Assistance - 24 hour     Equipment Recommendations  Other (comment)(TBA)    Recommendations for Other Services Rehab consult     Precautions / Restrictions Precautions Precautions: Fall Precaution Comments: right hemiparesis Restrictions Weight Bearing Restrictions: No    Mobility  Bed Mobility Overal bed mobility: Needs Assistance Bed Mobility: Rolling;Sidelying to Sit Rolling: Mod assist Sidelying to sit: Mod assist       General bed mobility comments: cues for rolling technique and transition up via L elbow.  L side/truncal assist for the roll  and coming up.  Transfers Overall transfer level: Needs assistance   Transfers: Sit to/from Stand Sit to Stand: Min assist;Mod assist Stand pivot transfers: Mod assist       General transfer comment: cues for standing technique, R knee stability and assist maneuvering the  RW.  Ambulation/Gait Ambulation/Gait assistance: Mod assist;Max assist Gait Distance (Feet): 6 Feet Assistive device: Rolling walker (2 wheeled) Gait Pattern/deviations: Step-to pattern;Decreased step length - right;Decreased stance time - right;Decreased stride length     General Gait Details: paretic gait with need for assist to advance R LE and guarding R knee for R w/shifting.   Stairs             Wheelchair Mobility    Modified Rankin (Stroke Patients Only) Modified Rankin (Stroke Patients Only) Pre-Morbid Rankin Score: No symptoms Modified Rankin: Moderately severe disability     Balance Overall balance assessment: Needs assistance Sitting-balance support: Single extremity supported;No upper extremity supported Sitting balance-Leahy Scale: Good Sitting balance - Comments: good sitting balance EOB   Standing balance support: Single extremity supported;Bilateral upper extremity supported Standing balance-Leahy Scale: Poor Standing balance comment: Worked on balance, pregait activity incl w/shifting, stepping forward and back for about 4-5 min.                            Cognition Arousal/Alertness: Awake/alert Behavior During Therapy: WFL for tasks assessed/performed Overall Cognitive Status: Within Functional Limits for tasks assessed                                 General Comments: emotional lability, very upset over change in function      Exercises Other Exercises Other Exercises: AA/resisted hip/knee flex/ext ROM excercise prior to mobility    General Comments General comments (skin integrity, edema, etc.): Sitting BP 158/72, vitals stable overall  Pertinent Vitals/Pain Pain Assessment: 0-10 Pain Score: 4  Pain Location: hemtoma spot, right hip/leg Pain Descriptors / Indicators: Discomfort Pain Intervention(s): Monitored during session    Home Living                      Prior Function            PT  Goals (current goals can now be found in the care plan section) Acute Rehab PT Goals Patient Stated Goal: return home, read, work in the flowers PT Goal Formulation: With patient Time For Goal Achievement: 08/23/19 Potential to Achieve Goals: Fair Progress towards PT goals: Progressing toward goals    Frequency    Min 4X/week      PT Plan Current plan remains appropriate    Co-evaluation              AM-PAC PT "6 Clicks" Mobility   Outcome Measure  Help needed turning from your back to your side while in a flat bed without using bedrails?: A Lot Help needed moving from lying on your back to sitting on the side of a flat bed without using bedrails?: A Lot Help needed moving to and from a bed to a chair (including a wheelchair)?: A Lot Help needed standing up from a chair using your arms (e.g., wheelchair or bedside chair)?: A Little Help needed to walk in hospital room?: Total Help needed climbing 3-5 steps with a railing? : Total 6 Click Score: 11    End of Session   Activity Tolerance: Patient tolerated treatment well Patient left: in chair;with call bell/phone within reach;with chair alarm set;with nursing/sitter in room Nurse Communication: Mobility status;Precautions PT Visit Diagnosis: Other abnormalities of gait and mobility (R26.89);Hemiplegia and hemiparesis Hemiplegia - Right/Left: Right Hemiplegia - dominant/non-dominant: Dominant Hemiplegia - caused by: Cerebral infarction     Time: 1020-1046 PT Time Calculation (min) (ACUTE ONLY): 26 min  Charges:  $Gait Training: 8-22 mins $Therapeutic Activity: 8-22 mins                     08/10/2019  Donnella Sham, PT Acute Rehabilitation Services [phone]  (pager) [phone]  (office)   Jacqueline Leach 08/10/2019, 10:57 AM

## 2019-08-11 ENCOUNTER — Encounter (HOSPITAL_COMMUNITY): Payer: Self-pay

## 2019-08-11 ENCOUNTER — Other Ambulatory Visit: Payer: Self-pay

## 2019-08-11 DIAGNOSIS — E0865 Diabetes mellitus due to underlying condition with hyperglycemia: Secondary | ICD-10-CM

## 2019-08-11 DIAGNOSIS — R509 Fever, unspecified: Secondary | ICD-10-CM

## 2019-08-11 LAB — CBC
HCT: 34.4 % — ABNORMAL LOW (ref 36.0–46.0)
Hemoglobin: 11.5 g/dL — ABNORMAL LOW (ref 12.0–15.0)
MCH: 29.6 pg (ref 26.0–34.0)
MCHC: 33.4 g/dL (ref 30.0–36.0)
MCV: 88.4 fL (ref 80.0–100.0)
Platelets: 185 10*3/uL (ref 150–400)
RBC: 3.89 MIL/uL (ref 3.87–5.11)
RDW: 12.5 % (ref 11.5–15.5)
WBC: 10.3 10*3/uL (ref 4.0–10.5)
nRBC: 0 % (ref 0.0–0.2)

## 2019-08-11 LAB — BASIC METABOLIC PANEL
Anion gap: 9 (ref 5–15)
BUN: 15 mg/dL (ref 8–23)
CO2: 24 mmol/L (ref 22–32)
Calcium: 8.4 mg/dL — ABNORMAL LOW (ref 8.9–10.3)
Chloride: 105 mmol/L (ref 98–111)
Creatinine, Ser: 0.56 mg/dL (ref 0.44–1.00)
GFR calc Af Amer: >60 mL/min (ref >60)
GFR calc non Af Amer: >60 mL/min (ref >60)
Glucose, Bld: 289 mg/dL — ABNORMAL HIGH (ref 70–99)
Potassium: 3.6 mmol/L (ref 3.5–5.1)
Sodium: 138 mmol/L (ref 135–145)

## 2019-08-11 LAB — GLUCOSE, CAPILLARY
Glucose-Capillary: 251 mg/dL — ABNORMAL HIGH (ref 70–99)
Glucose-Capillary: 241 mg/dL — ABNORMAL HIGH (ref 70–99)
Glucose-Capillary: 318 mg/dL — ABNORMAL HIGH (ref 70–99)
Glucose-Capillary: 219 mg/dL — ABNORMAL HIGH (ref 70–99)

## 2019-08-11 MED ORDER — INSULIN GLARGINE 100 UNIT/ML ~~LOC~~ SOLN
12.0000 [IU] | Freq: Every day | SUBCUTANEOUS | Status: DC
  Administered 2019-08-12: 12 [IU] via SUBCUTANEOUS
  Filled 2019-08-11: qty 0.12

## 2019-08-11 NOTE — Progress Notes (Signed)
STROKE TEAM PROGRESS NOTE   INTERVAL HISTORY Pt is sitting in chair, no acute event overnight and no complains. Right LE strength improving. Pending CIR   OBJECTIVE Vitals:   08/11/19 0300 08/11/19 0400 08/11/19 0500 08/11/19 0600  BP: (!) 126/47 (!) 130/49 134/61 126/67  Pulse: 73 76 73 69  Resp: 19 (!) 21 20 18   Temp:  98.9 F (37.2 C)    TempSrc:  Oral    SpO2: 92% 93% 94% 96%  Weight:      Height:        CBC:  Recent Labs  Lab 08/09/19 0815 08/11/19 0410  WBC 10.9* 10.3  HGB 11.9* 11.5*  HCT 34.0* 34.4*  MCV 84.4 88.4  PLT 185 [id number]    Basic Metabolic Panel:  Recent Labs  Lab 08/09/19 0815 08/11/19 0410  NA 135 138  K 3.7 3.6  CL 101 105  CO2 24 24  GLUCOSE 250* 289*  BUN 19 15  CREATININE 0.66 0.56  CALCIUM 8.3* 8.4*    Lipid Panel:     Component Value Date/Time   CHOL 147 08/09/2019 0503   TRIG 82 08/09/2019 0503   HDL 28 (L) 08/09/2019 0503   CHOLHDL 5.3 08/09/2019 0503   VLDL 16 08/09/2019 0503   LDLCALC 103 (H) 08/09/2019 0503   HgbA1c:  Lab Results  Component Value Date   HGBA1C 11.8 (H) 08/09/2019   Urine Drug Screen:     Component Value Date/Time   LABOPIA NONE DETECTED 08/10/2019 0840   COCAINSCRNUR NONE DETECTED 08/10/2019 0840   LABBENZ NONE DETECTED 08/10/2019 0840   AMPHETMU NONE DETECTED 08/10/2019 0840   THCU NONE DETECTED 08/10/2019 0840   LABBARB NONE DETECTED 08/10/2019 0840    Alcohol Level No results found for: [facility]  IMAGING  CTA head and neck 1. No emergent large vessel occlusion. 2. 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 Right PCA fetal origin. 3. Severe stenosis of the left PCA distal P1 segment. 4. Mild aortic Atherosclerosis (ICD10-I70.0).  CT head - normal   Mr Brain Wo Contrast 08/09/2019 IMPRESSION:  1. Left pontine acute perforator infarction.  2. Remote lacunar infarct in the right cerebral white matter.   Dg Chest Port 1 View 08/09/2019 IMPRESSION:  No evidence of  active disease.   Vas US Carotid 08/09/2019 Summary:  Right Carotid: Velocities in the right ICA are consistent with a 1-39% stenosis.  Left Carotid: Velocities in the left ICA are consistent with a 1-39% stenosis.  Vertebrals: Bilateral vertebral arteries demonstrate antegrade flow. Preliminary    Transthoracic Echocardiogram  08/09/19 IMPRESSIONS  1. Left ventricular ejection fraction, by visual estimation, is 60 to 65%. The left ventricle has normal function. There is no left ventricular hypertrophy.  2. Left ventricular diastolic parameters are consistent with Grade I diastolic dysfunction (impaired relaxation).  3. Global right ventricle has normal systolic function.The right ventricular size is normal.  4. Left atrial size was normal.  5. Right atrial size was normal.  6. Mild mitral annular calcification.  7. The mitral valve is normal in structure. Trace mitral valve regurgitation. No evidence of mitral stenosis.  8. The tricuspid valve is normal in structure. Tricuspid valve regurgitation is mild.  9. The aortic valve is tricuspid. Aortic valve regurgitation is not visualized. Mild aortic valve sclerosis without stenosis. 10. The pulmonic valve was normal in structure. Pulmonic valve regurgitation is not visualized. 11. The inferior vena cava is normal in size with greater than 50% respiratory variability, suggesting  right atrial pressure of 3 mmHg. 12. Normal LV systolic function; grade 1 diastolic dysfunction.   ECG - SR rate 79  BPM. (See cardiology reading for complete details)  PHYSICAL EXAM  Temp:  [98.4 F (36.9 C)-99.1 F (37.3 C)] 98.9 F (37.2 C) (11/29 0400) Pulse Rate:  [69-91] 69 (11/29 0600) Resp:  [16-26] 18 (11/29 0600) BP: (97-161)/(47-149) 126/67 (11/29 0600) SpO2:  [92 %-99 %] 96 % (11/29 0600)  General - Well nourished, well developed, in no apparent distress.  Ophthalmologic - fundi not visualized due to noncooperation.  Cardiovascular -  Regular rhythm and rate.  Mental Status -  Level of arousal and orientation to time, place, and person were intact. Language including expression, naming, repetition, comprehension was assessed and found intact. Fund of Knowledge was assessed and was intact.  Cranial Nerves II - XII - II - Visual field intact OU. III, IV, VI - Extraocular movements intact. V - Facial sensation intact bilaterally. VII - mild right facial droop. VIII - Hearing & vestibular intact bilaterally. X - Palate elevates symmetrically. XI - Chin turning & shoulder shrug intact bilaterally. XII - Tongue protrusion intact.  Motor Strength - The patient's strength was normal in LUE and LLE, however, RUE 2/5 distal 2/5 proximal, RLE proximal 2/5 and distal 0/5.  Bulk was normal and fasciculations were absent.   Motor Tone - Muscle tone was assessed at the neck and appendages and was normal.  Reflexes - The patient's reflexes were decreased on the RUE and RLE and she had no pathological reflexes.  Sensory - Light touch, temperature/pinprick were assessed and were symmetrical.    Coordination - The patient had normal movements in the left hand with no ataxia or dysmetria.  Tremor was absent.  Gait and Station - deferred.   ASSESSMENT/PLAN [name] is a 63 y.o. female with history of arthritis, diabetes mellitus noncompliant with her medications, possibly hypertension presents to the emergency department at [facility] with sudden onset right sided weakness and slurred speech resulting in her falling at 6:30 PM on 08/08/19. TPA at [facility] per tele-neurology - D/C'd at [facility] due to some hematemesis as well as hematoma on the right leg.  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 Remote lacunar infarct in the right cerebral white matter.  CTA Head - OSH - bilateral PCA high-grade stenosis but no large vessel occlusion.  Carotid Doppler - unremarkable  2D Echo - EF 60 - 65%  Sars Corona Virus 2 - negative  LDL - 103  HgbA1c - 11.8  VTE prophylaxis -Lovenox  No antithrombotic prior to admission, now on ASA 81 mg daily and Plavix 75 mg daily.  Continue DAPT for 3 weeks and then aspirin alone.  Patient was counseled to be compliant with her antithrombotic medications  Ongoing aggressive stroke risk factor management  Therapy recommendations:  CIR  Disposition:  Pending  Hematemesis and hematoma R thigh after tPA  Hemoglobin - 12.4 at 5 AM - stat repeat ->11.9->11.5  Protonix 40 mg IV QD  Hematemesis resolved  Right thigh hematoma stable  Hypertension  Home BP meds: none  Current BP meds: none   BP running low -> 250 cc NS bolus . Permissive hypertension (OK if <180/105) but gradually normalize in 5-7 days   .  Long-term BP goal normotensive  Hyperlipidemia  Home Lipid lowering medication: none  LDL 103, goal < 70  Current lipid lowering medication: On Lipitor 80 mg daily  Continue statin at discharge  Diabetes  Home diabetic meds: none  SSI  CBG monitoring  HgbA1c 11.8, goal < 7.0  Continue to be hyperglycemic  Add Metformin 500 mg Bid (first dose 11/28 at 1700)  Now on lantus 12U daily  DM coordinator will meet with pt when medically stable.  Other Stroke Risk Factors  Advanced age  Obesity, Body mass index is 29.91 kg/m., recommend weight loss, diet and exercise as appropriate   Medical non compliance  Other Active Problems  Mild anemia   Hospital day # 2  This patient is critically ill due to stroke status post TPA, hyperglycemia, hematemesis post TPA and at significant risk of neurological worsening, death form recurrent stroke, hemorrhagic conversion, GI bleeding, seizure, DKA.  This patient's care requires constant monitoring of vital signs, hemodynamics, respiratory and cardiac monitoring, review of multiple databases, neurological assessment, discussion with family, other specialists and medical decision making of high complexity. I spent 35 minutes of neurocritical care time in the care of this patient.  Rosalin Hawking, MD PhD Stroke Neurology 08/11/2019 9:56 PM   To contact Stroke Continuity provider, please refer to [email or web address] After hours, contact General Neurology

## 2019-08-11 NOTE — Progress Notes (Signed)
Inpatient Diabetes Program Recommendations  AACE/ADA: New Consensus Statement on Inpatient Glycemic Control (2015)  Target Ranges:  Prepandial:   less than 140 mg/dL      Peak postprandial:   less than 180 mg/dL (1-2 hours)      Critically ill patients:  140 - 180 mg/dL   Lab Results  Component Value Date   GLUCAP 241 (H) 08/11/2019   HGBA1C 11.8 (H) 08/09/2019    Review of Glycemic Control Results for Jacqueline, Leach (MRN [id number]) as of 08/11/2019 13:27  Ref. Range 08/10/2019 16:35 08/10/2019 22:04 08/11/2019 07:37 08/11/2019 11:13  Glucose-Capillary Latest Ref Range: 70 - 99 mg/dL 243 (H) 189 (H) 251 (H) 241 (H)  Diabetes history: type 2 Outpatient Diabetes medications: none listed Current orders for Inpatient glycemic control: Novolog MODERATE correction scale TID & HS, Metformin 500 mg BID  Inpatient Diabetes Program Recommendations:    Please consider adding Lantus 12 units daily.  Will see patient when appropriate.   Thanks,  Adah Perl, RN, BC-ADM Inpatient Diabetes Coordinator Pager [phone] (8a-5p)

## 2019-08-11 NOTE — Progress Notes (Signed)
Physical Therapy Treatment Patient Details Name: Jacqueline Leach MRN: [id number] DOB: 12/05/1957 Today's Date: 08/11/2019    History of Present Illness 63 y.o. female with past medical history significant for arthritis, DM noncompliant with her medications, possibly hypertension presented to [facility] ED with sudden onset weakness and slurred speech resulting in her falling at 6:30 PM on 08/08/19. tPA given however, instead of infusion being given 62 mL/h TPA rate was set for 6.8 ml/hr, and so patient still had not completed TPA on arrival to [facility]  In addition patient had some hematemesis as well as hematoma on the right leg and therefore TPA was held. MRI : Left pontine infarct    PT Comments    Pt very pleasant sitting in chair on arrival stating comfort in chair and not wanting to return to bed end of session. PT with improved movement and advancing ability of RLE with transfers and gait. Pt able to initiate gait with mod assist and support of EVA walker this session. Pt educated for squat and stand pivot transfers and required assist for pericare in standing after toileting. Pt excited to be taking steps today and concerned of what her grandkids will think of her change in function. CIR remains appropriate.    Follow Up Recommendations  CIR;Supervision/Assistance - 24 hour     Equipment Recommendations       Recommendations for Other Services       Precautions / Restrictions Precautions Precautions: Fall Precaution Comments: right hemiparesis    Mobility  Bed Mobility               General bed mobility comments: in recliner on arrival  Transfers Overall transfer level: Needs assistance   Transfers: Sit to/from Stand;Stand Pivot Transfers Sit to Stand: Min assist Stand pivot transfers: Min assist       General transfer comment: pt able to stand from recliner with cues for sequence and guarding for right knee stability. Sit to stand x 4 trials. Pt  performed stand pivot toward Rt and LEFt for [facility] with cues for positioning and stepping with RLE with pt able to advance RLE with increased time  Ambulation/Gait Ambulation/Gait assistance: Mod assist Gait Distance (Feet): 15 Feet Assistive device: Bilateral platform walker(EVA walker) Gait Pattern/deviations: Step-to pattern;Decreased step length - right;Decreased stance time - right;Decreased stride length   Gait velocity interpretation: <1.8 ft/sec, indicate of risk for recurrent falls General Gait Details: pt able to step forward and back 2' with bil UE support on therapist with face to face technique with good stability in standing for RLE. Pt then performed gait 15' with EVA.  cues to step into EVA walker. Pt with reliance on UE support on EVA with pt able to advance RLE without assist 50% of the time with mod cues for positioning and safety.   Stairs             Wheelchair Mobility    Modified Rankin (Stroke Patients Only) Modified Rankin (Stroke Patients Only) Pre-Morbid Rankin Score: No symptoms Modified Rankin: Moderately severe disability     Balance Overall balance assessment: Needs assistance   Sitting balance-Leahy Scale: Good Sitting balance - Comments: good sitting balance   Standing balance support: Bilateral upper extremity supported;Single extremity supported Standing balance-Leahy Scale: Poor Standing balance comment: reliance on LUE assist for stability with weak RLE  Cognition Arousal/Alertness: Awake/alert Behavior During Therapy: WFL for tasks assessed/performed Overall Cognitive Status: Within Functional Limits for tasks assessed                                 General Comments: emotional lability end of session with reality of taking steps again      Exercises      General Comments        Pertinent Vitals/Pain Pain Assessment: No/denies pain    Home Living                       Prior Function            PT Goals (current goals can now be found in the care plan section) Progress towards PT goals: Progressing toward goals    Frequency           PT Plan Current plan remains appropriate    Co-evaluation              AM-PAC PT "6 Clicks" Mobility   Outcome Measure  Help needed turning from your back to your side while in a flat bed without using bedrails?: A Lot Help needed moving from lying on your back to sitting on the side of a flat bed without using bedrails?: A Lot Help needed moving to and from a bed to a chair (including a wheelchair)?: A Little Help needed standing up from a chair using your arms (e.g., wheelchair or bedside chair)?: A Little Help needed to walk in hospital room?: A Lot Help needed climbing 3-5 steps with a railing? : Total 6 Click Score: 13    End of Session Equipment Utilized During Treatment: Gait belt Activity Tolerance: Patient tolerated treatment well Patient left: in chair;with call bell/phone within reach;with chair alarm set Nurse Communication: Mobility status;Precautions PT Visit Diagnosis: Other abnormalities of gait and mobility (R26.89);Hemiplegia and hemiparesis Hemiplegia - Right/Left: Right Hemiplegia - dominant/non-dominant: Dominant Hemiplegia - caused by: Cerebral infarction     Time: 1217-1246 PT Time Calculation (min) (ACUTE ONLY): 29 min  Charges:  $Gait Training: 8-22 mins $Therapeutic Activity: 8-22 mins                     Scherrie Seneca P, PT Acute Rehabilitation Services Pager: [phone] Office: [place] 08/11/2019, 1:59 PM

## 2019-08-12 ENCOUNTER — Encounter (HOSPITAL_COMMUNITY): Payer: Self-pay

## 2019-08-12 ENCOUNTER — Other Ambulatory Visit: Payer: Self-pay

## 2019-08-12 ENCOUNTER — Inpatient Hospital Stay (HOSPITAL_COMMUNITY)
Admission: RE | Admit: 2019-08-12 | Discharge: 2019-08-29 | DRG: 057 | Disposition: A | Discharge status: Home Health | Payer: Self-pay | Source: Intra-hospital | Attending: Physical Medicine & Rehabilitation | Admitting: Physical Medicine & Rehabilitation

## 2019-08-12 DIAGNOSIS — S7011XA Contusion of right thigh, initial encounter: Secondary | ICD-10-CM

## 2019-08-12 DIAGNOSIS — E785 Hyperlipidemia, unspecified: Secondary | ICD-10-CM | POA: Diagnosis present

## 2019-08-12 DIAGNOSIS — G4700 Insomnia, unspecified: Secondary | ICD-10-CM | POA: Diagnosis present

## 2019-08-12 DIAGNOSIS — Y92009 Unspecified place in unspecified non-institutional (private) residence as the place of occurrence of the external cause: Secondary | ICD-10-CM

## 2019-08-12 DIAGNOSIS — Z7409 Other reduced mobility: Secondary | ICD-10-CM

## 2019-08-12 DIAGNOSIS — I10 Essential (primary) hypertension: Secondary | ICD-10-CM | POA: Diagnosis present

## 2019-08-12 DIAGNOSIS — W19XXXD Unspecified fall, subsequent encounter: Secondary | ICD-10-CM | POA: Diagnosis present

## 2019-08-12 DIAGNOSIS — E1142 Type 2 diabetes mellitus with diabetic polyneuropathy: Secondary | ICD-10-CM | POA: Diagnosis present

## 2019-08-12 DIAGNOSIS — I69351 Hemiplegia and hemiparesis following cerebral infarction affecting right dominant side: Principal | ICD-10-CM

## 2019-08-12 DIAGNOSIS — I69328 Other speech and language deficits following cerebral infarction: Secondary | ICD-10-CM

## 2019-08-12 DIAGNOSIS — Z882 Allergy status to sulfonamides status: Secondary | ICD-10-CM

## 2019-08-12 DIAGNOSIS — S7011XD Contusion of right thigh, subsequent encounter: Secondary | ICD-10-CM

## 2019-08-12 DIAGNOSIS — K5900 Constipation, unspecified: Secondary | ICD-10-CM | POA: Diagnosis present

## 2019-08-12 DIAGNOSIS — IMO0002 Reserved for concepts with insufficient information to code with codable children: Secondary | ICD-10-CM | POA: Diagnosis present

## 2019-08-12 DIAGNOSIS — E669 Obesity, unspecified: Secondary | ICD-10-CM | POA: Diagnosis present

## 2019-08-12 DIAGNOSIS — I639 Cerebral infarction, unspecified: Secondary | ICD-10-CM | POA: Diagnosis present

## 2019-08-12 DIAGNOSIS — E1165 Type 2 diabetes mellitus with hyperglycemia: Secondary | ICD-10-CM | POA: Diagnosis present

## 2019-08-12 DIAGNOSIS — Z9114 Patient's other noncompliance with medication regimen: Secondary | ICD-10-CM

## 2019-08-12 DIAGNOSIS — W19XXXA Unspecified fall, initial encounter: Secondary | ICD-10-CM

## 2019-08-12 DIAGNOSIS — I6350 Cerebral infarction due to unspecified occlusion or stenosis of unspecified cerebral artery: Secondary | ICD-10-CM

## 2019-08-12 DIAGNOSIS — Z789 Other specified health status: Secondary | ICD-10-CM

## 2019-08-12 DIAGNOSIS — K920 Hematemesis: Secondary | ICD-10-CM | POA: Diagnosis not present

## 2019-08-12 DIAGNOSIS — E0865 Diabetes mellitus due to underlying condition with hyperglycemia: Secondary | ICD-10-CM

## 2019-08-12 DIAGNOSIS — R112 Nausea with vomiting, unspecified: Secondary | ICD-10-CM

## 2019-08-12 HISTORY — DX: Reserved for concepts with insufficient information to code with codable children: IMO0002

## 2019-08-12 HISTORY — DX: Hyperlipidemia, unspecified: E78.5

## 2019-08-12 HISTORY — DX: Essential (primary) hypertension: I10

## 2019-08-12 HISTORY — DX: Type 2 diabetes mellitus with hyperglycemia: E11.65

## 2019-08-12 LAB — BASIC METABOLIC PANEL
Anion gap: 11 (ref 5–15)
BUN: 18 mg/dL (ref 8–23)
CO2: 24 mmol/L (ref 22–32)
Calcium: 8.4 mg/dL — ABNORMAL LOW (ref 8.9–10.3)
Chloride: 103 mmol/L (ref 98–111)
Creatinine, Ser: 0.5 mg/dL (ref 0.44–1.00)
GFR calc Af Amer: >60 mL/min (ref >60)
GFR calc non Af Amer: >60 mL/min (ref >60)
Glucose, Bld: 218 mg/dL — ABNORMAL HIGH (ref 70–99)
Potassium: 3.4 mmol/L — ABNORMAL LOW (ref 3.5–5.1)
Sodium: 138 mmol/L (ref 135–145)

## 2019-08-12 LAB — GLUCOSE, CAPILLARY
Glucose-Capillary: 276 mg/dL — ABNORMAL HIGH (ref 70–99)
Glucose-Capillary: 201 mg/dL — ABNORMAL HIGH (ref 70–99)
Glucose-Capillary: 241 mg/dL — ABNORMAL HIGH (ref 70–99)
Glucose-Capillary: 183 mg/dL — ABNORMAL HIGH (ref 70–99)

## 2019-08-12 LAB — CBC
HCT: 32.9 % — ABNORMAL LOW (ref 36.0–46.0)
Hemoglobin: 11.4 g/dL — ABNORMAL LOW (ref 12.0–15.0)
MCH: 30.1 pg (ref 26.0–34.0)
MCHC: 34.7 g/dL (ref 30.0–36.0)
MCV: 86.8 fL (ref 80.0–100.0)
Platelets: 188 10*3/uL (ref 150–400)
RBC: 3.79 MIL/uL — ABNORMAL LOW (ref 3.87–5.11)
RDW: 12.3 % (ref 11.5–15.5)
WBC: 11.6 10*3/uL — ABNORMAL HIGH (ref 4.0–10.5)
nRBC: 0 % (ref 0.0–0.2)

## 2019-08-12 MED ORDER — ASPIRIN 81 MG PO CHEW: 81.0000 mg | CHEWABLE_TABLET | Freq: Every day | ORAL | Status: DC

## 2019-08-12 MED ORDER — ENOXAPARIN SODIUM 40 MG/0.4ML ~~LOC~~ SOLN
40.0000 mg | SUBCUTANEOUS | Status: DC
  Administered 2019-08-13 – 2019-08-29 (×17): 40 mg via SUBCUTANEOUS
  Filled 2019-08-12 (×17): qty 0.4

## 2019-08-12 MED ORDER — ACETAMINOPHEN 325 MG PO TABS
650.0000 mg | ORAL_TABLET | ORAL | Status: DC | PRN
  Administered 2019-08-17 – 2019-08-28 (×12): 650 mg via ORAL
  Filled 2019-08-12 (×13): qty 2

## 2019-08-12 MED ORDER — INSULIN GLARGINE 100 UNIT/ML ~~LOC~~ SOLN
12.0000 [IU] | Freq: Every day | SUBCUTANEOUS | Status: DC
  Filled 2019-08-12: qty 0.12

## 2019-08-12 MED ORDER — PANTOPRAZOLE SODIUM 40 MG PO TBEC: 40.0000 mg | DELAYED_RELEASE_TABLET | Freq: Every day | ORAL | Status: DC

## 2019-08-12 MED ORDER — METFORMIN HCL 500 MG PO TABS
500.0000 mg | ORAL_TABLET | Freq: Two times a day (BID) | ORAL | Status: DC
  Administered 2019-08-12 – 2019-08-13 (×3): 500 mg via ORAL
  Filled 2019-08-12 (×3): qty 1

## 2019-08-12 MED ORDER — BISACODYL 5 MG PO TBEC
5.0000 mg | DELAYED_RELEASE_TABLET | Freq: Every day | ORAL | Status: DC | PRN
  Administered 2019-08-18: 5 mg via ORAL
  Filled 2019-08-12: qty 1

## 2019-08-12 MED ORDER — SORBITOL 70 % SOLN
30.0000 mL | Freq: Every day | Status: DC | PRN
  Administered 2019-08-18: 13:00:00 30 mL via ORAL
  Filled 2019-08-12: qty 30

## 2019-08-12 MED ORDER — ENOXAPARIN SODIUM 40 MG/0.4ML ~~LOC~~ SOLN: 40.0000 mg | SUBCUTANEOUS | Status: DC

## 2019-08-12 MED ORDER — ASPIRIN 81 MG PO CHEW
81.0000 mg | CHEWABLE_TABLET | Freq: Every day | ORAL | Status: DC
  Administered 2019-08-13 – 2019-08-29 (×17): 81 mg via ORAL
  Filled 2019-08-12 (×17): qty 1

## 2019-08-12 MED ORDER — ACETAMINOPHEN 160 MG/5ML PO SOLN: 650.0000 mg | ORAL | Status: DC | PRN

## 2019-08-12 MED ORDER — BISACODYL 5 MG PO TBEC
5.0000 mg | DELAYED_RELEASE_TABLET | Freq: Every day | ORAL | Status: DC | PRN
  Administered 2019-08-12: 14:00:00 5 mg via ORAL
  Filled 2019-08-12: qty 1

## 2019-08-12 MED ORDER — ATORVASTATIN CALCIUM 80 MG PO TABS
80.0000 mg | ORAL_TABLET | Freq: Every day | ORAL | Status: DC
  Administered 2019-08-12 – 2019-08-28 (×17): 80 mg via ORAL
  Filled 2019-08-12 (×18): qty 1

## 2019-08-12 MED ORDER — ATORVASTATIN CALCIUM 80 MG PO TABS: 80.0000 mg | ORAL_TABLET | Freq: Every day | ORAL | Status: DC

## 2019-08-12 MED ORDER — PANTOPRAZOLE SODIUM 40 MG PO TBEC
40.0000 mg | DELAYED_RELEASE_TABLET | Freq: Every day | ORAL | Status: DC
  Administered 2019-08-13 – 2019-08-29 (×17): 40 mg via ORAL
  Filled 2019-08-12 (×17): qty 1

## 2019-08-12 MED ORDER — INSULIN ASPART 100 UNIT/ML ~~LOC~~ SOLN: 0.0000 [IU] | Freq: Three times a day (TID) | SUBCUTANEOUS | 11 refills | Status: DC

## 2019-08-12 MED ORDER — CLOPIDOGREL BISULFATE 75 MG PO TABS: 75.0000 mg | ORAL_TABLET | Freq: Every day | ORAL | Status: DC

## 2019-08-12 MED ORDER — INSULIN GLARGINE 100 UNIT/ML ~~LOC~~ SOLN: 12.0000 [IU] | Freq: Every day | SUBCUTANEOUS | 11 refills | Status: DC

## 2019-08-12 MED ORDER — INSULIN ASPART 100 UNIT/ML ~~LOC~~ SOLN
0.0000 [IU] | Freq: Three times a day (TID) | SUBCUTANEOUS | Status: DC
  Administered 2019-08-12: 19:00:00 5 [IU] via SUBCUTANEOUS
  Administered 2019-08-13: 18:00:00 3 [IU] via SUBCUTANEOUS
  Administered 2019-08-13: 10:00:00 5 [IU] via SUBCUTANEOUS
  Administered 2019-08-13: 13:00:00 3 [IU] via SUBCUTANEOUS
  Administered 2019-08-14: 13:00:00 2 [IU] via SUBCUTANEOUS
  Administered 2019-08-14: 09:00:00 5 [IU] via SUBCUTANEOUS
  Administered 2019-08-14: 18:00:00 3 [IU] via SUBCUTANEOUS
  Administered 2019-08-15: 18:00:00 5 [IU] via SUBCUTANEOUS
  Administered 2019-08-15: 08:00:00 2 [IU] via SUBCUTANEOUS
  Administered 2019-08-15: 12:00:00 8 [IU] via SUBCUTANEOUS
  Administered 2019-08-16 (×2): 5 [IU] via SUBCUTANEOUS
  Administered 2019-08-16: 08:00:00 3 [IU] via SUBCUTANEOUS
  Administered 2019-08-17: 18:00:00 11 [IU] via SUBCUTANEOUS
  Administered 2019-08-17 (×2): 5 [IU] via SUBCUTANEOUS
  Administered 2019-08-18 – 2019-08-19 (×4): 3 [IU] via SUBCUTANEOUS
  Administered 2019-08-19 (×2): 2 [IU] via SUBCUTANEOUS
  Administered 2019-08-20 (×2): 3 [IU] via SUBCUTANEOUS
  Administered 2019-08-20: 17:00:00 2 [IU] via SUBCUTANEOUS
  Administered 2019-08-21: 19:00:00 5 [IU] via SUBCUTANEOUS
  Administered 2019-08-21 (×2): 3 [IU] via SUBCUTANEOUS
  Administered 2019-08-22: 13:00:00 8 [IU] via SUBCUTANEOUS
  Administered 2019-08-22: 18:00:00 2 [IU] via SUBCUTANEOUS
  Administered 2019-08-22 – 2019-08-23 (×2): 3 [IU] via SUBCUTANEOUS
  Administered 2019-08-23: 5 [IU] via SUBCUTANEOUS
  Administered 2019-08-23 – 2019-08-24 (×3): 3 [IU] via SUBCUTANEOUS
  Administered 2019-08-24: 13:00:00 5 [IU] via SUBCUTANEOUS
  Administered 2019-08-25 (×2): 3 [IU] via SUBCUTANEOUS
  Administered 2019-08-25 – 2019-08-26 (×2): 2 [IU] via SUBCUTANEOUS
  Administered 2019-08-26: 09:00:00 5 [IU] via SUBCUTANEOUS
  Administered 2019-08-26 – 2019-08-27 (×2): 3 [IU] via SUBCUTANEOUS
  Administered 2019-08-27: 19:00:00 2 [IU] via SUBCUTANEOUS
  Administered 2019-08-27: 14:00:00 3 [IU] via SUBCUTANEOUS
  Administered 2019-08-28: 12:00:00 5 [IU] via SUBCUTANEOUS
  Administered 2019-08-28 – 2019-08-29 (×3): 2 [IU] via SUBCUTANEOUS

## 2019-08-12 MED ORDER — METFORMIN HCL 500 MG PO TABS: 500.0000 mg | ORAL_TABLET | Freq: Two times a day (BID) | ORAL | Status: DC

## 2019-08-12 MED ORDER — CLOPIDOGREL BISULFATE 75 MG PO TABS
75.0000 mg | ORAL_TABLET | Freq: Every day | ORAL | Status: DC
  Administered 2019-08-13 – 2019-08-29 (×17): 75 mg via ORAL
  Filled 2019-08-12 (×17): qty 1

## 2019-08-12 MED ORDER — INSULIN ASPART 100 UNIT/ML ~~LOC~~ SOLN: 0.0000 [IU] | Freq: Every day | SUBCUTANEOUS | 11 refills | Status: DC

## 2019-08-12 MED ORDER — ACETAMINOPHEN 650 MG RE SUPP: 650.0000 mg | RECTAL | Status: DC | PRN

## 2019-08-12 MED ORDER — BISACODYL 10 MG RE SUPP: 10.0000 mg | Freq: Once | RECTAL | Status: DC

## 2019-08-12 NOTE — Discharge Summary (Addendum)
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 No pertinent past medical history. History reviewed. No pertinent surgical history.  Medications to be continued on Rehab Allergies as of 08/12/2019      Reactions   Penicillins Rash   Did it involve swelling of the face/tongue/throat, SOB, or low BP? Yes Did it involve sudden or severe rash/hives, skin peeling, or any reaction on the inside of your mouth or nose? Yes Did you need to seek medical attention at a hospital or doctor's office? Yes When did it last happen?approx 63yo If all above answers are "NO", may proceed with cephalosporin use.   Sulfa Antibiotics Rash      Medication List    STOP taking these medications   aspirin 325 MG EC tablet Replaced by: aspirin 81 MG chewable tablet     TAKE these medications   aspirin 81 MG chewable tablet Chew 1 tablet (81 mg total) by mouth daily. Start taking on: August 13, 2019 Replaces: aspirin 325 MG EC tablet   atorvastatin 80 MG tablet Commonly known as: LIPITOR Take 1 tablet (80 mg total) by mouth daily at 6 PM.   clopidogrel 75 MG tablet Commonly known as: PLAVIX Take 1 tablet (75 mg total) by mouth daily. Start taking on: August 13, 2019   enoxaparin 40 MG/0.4ML injection Commonly known as: LOVENOX Inject 0.4 mLs (40 mg total) into the skin daily. Start taking on: August 13, 2019   insulin aspart 100 UNIT/ML injection Commonly known as: novoLOG Inject 0-15 Units into the skin 3 (three) times daily with meals.   insulin aspart 100 UNIT/ML injection Commonly known as: novoLOG Inject 0-5 Units into the skin at bedtime.   insulin glargine 100 UNIT/ML injection Commonly known as: LANTUS Inject 0.12 mLs (12 Units total) into the skin daily. Start taking on: August 13, 2019   metFORMIN 500 MG tablet Commonly known as: GLUCOPHAGE Take 1 tablet (500 mg total) by mouth 2 (two) times daily with a meal.   pantoprazole 40 MG tablet Commonly known as: PROTONIX Take 1 tablet (40 mg total) by mouth daily. Start taking on: August 13, 2019       LABORATORY STUDIES CBC    Component Value Date/Time   WBC 11.6 (H) 08/12/2019 0335   RBC 3.79 (L) 08/12/2019 0335   HGB 11.4 (L) 08/12/2019 0335   HCT 32.9 (L) 08/12/2019 0335   PLT 188 08/12/2019 0335   MCV 86.8 08/12/2019 0335   MCH 30.1 08/12/2019 0335   MCHC 34.7 08/12/2019 0335   RDW 12.3 08/12/2019 0335   CMP    Component Value Date/Time   NA 138 08/12/2019 0335   K 3.4 (L) 08/12/2019 0335   CL 103 08/12/2019 0335   CO2 24 08/12/2019 0335   GLUCOSE 218 (H) 08/12/2019 0335   BUN 18 08/12/2019 0335   CREATININE 0.50  08/12/2019 0335   CALCIUM 8.4 (L) 08/12/2019 0335   PROT 5.7 (L) 08/09/2019 0815   ALBUMIN 3.0 (L) 08/09/2019 0815   AST 16 08/09/2019 0815   ALT 18 08/09/2019 0815   ALKPHOS 65 08/09/2019 0815   BILITOT 0.9 08/09/2019 0815   GFRNONAA >60 08/12/2019 0335   GFRAA >60 08/12/2019 0335   COAGSNo results found for: INR, PROTIME Lipid Panel    Component Value Date/Time   CHOL 147 08/09/2019 0503   TRIG 82 08/09/2019 0503   HDL 28 (L) 08/09/2019 0503   CHOLHDL 5.3 08/09/2019 0503   VLDL 16 08/09/2019 0503   LDLCALC 103 (H) 08/09/2019 0503   HgbA1C  Lab Results  Component Value Date   HGBA1C 11.8 (H) 08/09/2019   Urinalysis    Component Value Date/Time   COLORURINE YELLOW  08/09/2019 2256   APPEARANCEUR HAZY (A) 08/09/2019 2256   LABSPEC 1.023 08/09/2019 2256   PHURINE 5.0 08/09/2019 2256   GLUCOSEU >=500 (A) 08/09/2019 2256   HGBUR NEGATIVE 08/09/2019 2256   BILIRUBINUR NEGATIVE 08/09/2019 2256   KETONESUR NEGATIVE 08/09/2019 2256   PROTEINUR NEGATIVE 08/09/2019 2256   NITRITE NEGATIVE 08/09/2019 2256   LEUKOCYTESUR NEGATIVE 08/09/2019 2256   Urine Drug Screen     Component Value Date/Time   LABOPIA NONE DETECTED 08/10/2019 0840   COCAINSCRNUR NONE DETECTED 08/10/2019 0840   LABBENZ NONE DETECTED 08/10/2019 0840   AMPHETMU NONE DETECTED 08/10/2019 0840   THCU NONE DETECTED 08/10/2019 0840   LABBARB NONE DETECTED 08/10/2019 0840    Alcohol Level No results found for: ETH   SIGNIFICANT DIAGNOSTIC STUDIES  CT head - normal  CTA head and neck 1. No emergent large vessel occlusion. 2. 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 Right PCA fetal origin. 3. Severe stenosis of the left PCA distal P1 segment. 4. Mild aortic Atherosclerosis (ICD10-I70.0).  Mr Brain Wo Contrast 08/09/2019 1. Left pontine acute perforator infarction.  2. Remote lacunar infarct in the right cerebral white matter.   Dg Chest Port 1 View 08/09/2019 No evidence of active disease.   Vas US Carotid 08/09/2019 Right Carotid: Velocities in the right ICA are consistent with a 1-39% stenosis.  Left Carotid: Velocities in the left ICA are consistent with a 1-39% stenosis.  Vertebrals: Bilateral vertebral arteries demonstrate antegrade flow.  Transthoracic Echocardiogram  08/09/19 1. Left ventricular ejection fraction, by visual estimation, is 60 to 65%. The left ventricle has normal function. There is no left ventricular hypertrophy. 2. Left ventricular diastolic parameters are consistent with Grade I diastolic dysfunction (impaired relaxation). 3. Global right ventricle has normal systolic function.The right ventricular size is  normal. 4. Left atrial size was normal. 5. Right atrial size was normal. 6. Mild mitral annular calcification. 7. The mitral valve is normal in structure. Trace mitral valve regurgitation. No evidence of mitral stenosis. 8. The tricuspid valve is normal in structure. Tricuspid valve regurgitation is mild. 9. The aortic valve is tricuspid. Aortic valve regurgitation is not visualized. Mild aortic valve sclerosis without stenosis. 10. The pulmonic valve was normal in structure. Pulmonic valve regurgitation is not visualized. 11. The inferior vena cava is normal in size with greater than 50% respiratory variability, suggesting right atrial pressure of 3 mmHg. 12. Normal LV systolic function; grade 1 diastolic dysfunction.  ECG - SR rate 79  BPM. (See cardiology reading for complete details)     HISTORY OF PRESENT ILLNESS Zona Michalik is a 63 y.o. female with past medical history significant for  arthritis, diabetes mellitus who is noncompliant with her medications, possible hypertension who presents to the emergency department at [facility] with sudden onset weakness and slurred speech resulting in her falling at 6:30 PM on 08/08/19 (last known well).  She was evaluated by EDP and tele-neurology and decision was made to give TPA.  Her NIH stroke scale was 4 for mild right upper extremity weakness and right lower weakness and dysarthria. Baseline MRS 0. CT angiogram  showed bilateral PCA stenosis but no large vessel occlusion. However, instead of tPA infusion being given at 62 mL/h tPA, rate was set for 6.8 ml/hr, therefore patient had not completed tPA on arrival to [facility].  In addition, patient had some hematemesis as well as R leg hematoma, therefore TPA was stopped.   On arrival to [facility], patient's right upper extremity weakness was worse.  Also hematoma in the right leg was larger in size. Patient was alert and oriented and did not appear in distress. She was admitted  to the neuro ICU.    HOSPITAL COURSE Ms. Jahnay Sturdivant is a 63 y.o. female with history of arthritis, diabetes mellitus noncompliant with her medications, possibly hypertension presents to the emergency department at [facility] with sudden onset right sided weakness and slurred speech resulting in her falling at 6:30 [facility] [place] 08/08/19. TPA at [facility] per tele-neurology - tPA stopped at [facility] due to hematemesis as well as R leg hematoma.  Stroke: Left pontine infarct s/p IV TPA at outside hospital received incomplete dosing due to inaccurate infusion schedule and hematemesis, enlarging leg hematoma. Infarct felt likely due to small vessel disease  CT head -no acute abnormality  CTA Head - [facility]) - bilateral PCA high-grade stenosis but no large vessel occlusion.  MRI head - Left pontine acute perforator infarction. Remote lacunar infarct in the right cerebral white matter.  Carotid Doppler - unremarkable  2D Echo - EF 60 - 65%  Sars Corona Virus 2 - negative  LDL - 103  HgbA1c - 11.8  VTE prophylaxis -Lovenox  No antithrombotic prior to admission, now on ASA 81 mg daily and Plavix 75 mg daily.  Continue DAPT for 3 weeks and then aspirin alone.  Patient was counseled to be compliant with her antithrombotic medications  Therapy recommendations:  CIR  Disposition:  CIR  Hematemesis and hematoma R thigh after tPA  Hemoglobin - 12.4 at 5 AM - stat repeat ->11.9->11.5->11.4   Protonix 40 mg IV QD  Hematemesis resolved  Right thigh hematoma stable  Hypertension  Home BP meds: none  Current BP meds: none   BP running low -> 250 cc NS bolus  BP goal normotensive  Hyperlipidemia  Home Lipid lowering medication: none  LDL 103, goal < 70  Current lipid lowering medication: On Lipitor 80 mg daily  Continue statin at discharge  Diabetes type II, uncontrolled  Home diabetic meds: none  SSI  CBG monitoring  HgbA1c 11.8, goal <  7.0  Continue to be hyperglycemic  On Metformin 500 mg Bid and lantus 12U daily  DM coordinator will meet with pt when medically stable.  Other Stroke Risk Factors  Advanced age  Obesity, Body mass index is 29.91 kg/m., recommend weight loss, diet and exercise as appropriate   Medical non compliance  Other Active Problems  Mild anemia  DISCHARGE EXAM Blood pressure (!) 136/125, pulse 75, temperature 98.2 F (36.8 C), temperature source Oral, resp. rate (!) 21, height 5\' 3"  (1.6 m), weight 76.6 kg, SpO2  95 %. General - Well nourished, well developed, in no apparent distress.  Ophthalmologic - fundi not visualized due to noncooperation.  Cardiovascular - Regular rhythm and rate.  Mental Status -  Level of arousal and orientation to time, place, and person were intact. Language including expression, naming, repetition, comprehension was assessed and found intact. Fund of Knowledge was assessed and was intact.  Cranial Nerves II - XII - II - Visual field intact OU. III, IV, VI - Extraocular movements intact. V - Facial sensation intact bilaterally. VII - mild right facial droop. VIII - Hearing & vestibular intact bilaterally. X - Palate elevates symmetrically. XI - Chin turning & shoulder shrug intact bilaterally. XII - Tongue protrusion intact.  Motor Strength - The patient's strength was normal in LUE and LLE, however, RUE 2/5 distal 2/5 proximal, RLE proximal 2/5 and distal 0/5.  Bulk was normal and fasciculations were absent.   Motor Tone - Muscle tone was assessed at the neck and appendages and was normal.  Reflexes - The patient's reflexes were decreased on the RUE and RLE and she had no pathological reflexes.  Sensory - Light touch, temperature/pinprick were assessed and were symmetrical.    Coordination - The patient had normal movements in the left hand with no ataxia or dysmetria.  Tremor was absent.  Gait and Station - deferred.  Discharge  Diet      Diet   Diet heart healthy/carb modified Room service appropriate? Yes with Assist; Fluid consistency: Thin   liquids  DISCHARGE PLAN  Disposition:  Transfer to Section for ongoing PT, OT and ST  aspirin 81 mg daily and clopidogrel 75 mg daily for secondary stroke prevention for 3 weeks then aspirin alone.  Recommend ongoing stroke risk factor control by Primary Care Physician at time of discharge from inpatient rehabilitation.  Follow-up Default, Provider, MD in 2 weeks following discharge from rehab.  Follow-up in [facility] in 4 weeks following discharge from rehab, office to schedule an appointment.   35 minutes were spent preparing discharge.  Rosalin Hawking, MD PhD Stroke Neurology 08/12/2019 7:18 PM

## 2019-08-12 NOTE — Progress Notes (Signed)
Occupational Therapy Treatment Patient Details Name: Jacqueline Leach MRN: [id number] DOB: 11-12-1955 Today's Date: 08/12/2019    History of present illness 63 y.o. female with past medical history significant for arthritis, DM noncompliant with her medications, possibly hypertension presented to [facility] ED with sudden onset weakness and slurred speech resulting in her falling at 6:30 PM on 08/08/19. tPA given however, instead of infusion being given 62 mL/h TPA rate was set for 6.8 ml/hr, and so patient still had not completed TPA on arrival to [facility].  In addition patient had some hematemesis as well as hematoma on the right leg and therefore TPA was held. MRI : Left pontine infarct   OT comments  Pt making excellent progress towards OT goals. Pt tolerated room level mobility using RW with +2 assist during this session (overall modA +2). Pt completing standing grooming ADL with overall modA for task completion and pt return demonstrating good use of RUE as gross stabilizer. HR up to 120s with activity with all other VSS. Pt remains motivated to return to her PLOF. She remains an excellent candidate for CIR level services. Will continue per POC.   Follow Up Recommendations  CIR    Equipment Recommendations  3 in 1 bedside commode;Tub/shower seat          Precautions / Restrictions Precautions Precautions: Fall Precaution Comments: right hemiparesis Restrictions Weight Bearing Restrictions: No       Mobility Bed Mobility Overal bed mobility: Needs Assistance Bed Mobility: Rolling;Sidelying to Sit Rolling: Mod assist Sidelying to sit: Mod assist       General bed mobility comments: assist for RUE towards L side, assist for trunk elevation   Transfers Overall transfer level: Needs assistance Equipment used: Rolling walker (2 wheeled);1 person hand held assist Transfers: Sit to/from Stand;Stand Pivot Transfers Sit to Stand: Min assist;+2 safety/equipment Stand  pivot transfers: Min assist;+2 safety/equipment       General transfer comment: boosting and steadying assist to stand, VCs for UE placement and sequencing steps for stand pivot to [facility]; requires increased assist for mobility d/t difficulty advancing RLE    Balance Overall balance assessment: Needs assistance Sitting-balance support: Single extremity supported;No upper extremity supported Sitting balance-Leahy Scale: Good     Standing balance support: Bilateral upper extremity supported;During functional activity Standing balance-Leahy Scale: Poor Standing balance comment: UE support/external assist                           ADL either performed or assessed with clinical judgement   ADL Overall ADL's : Needs assistance/impaired     Grooming: Moderate assistance;Standing Grooming Details (indicate cue type and reason): +2 for safety with standing grooming ADL; pt able to use RUE as gross stabilizer for opening and applying toothpaste to toothbrush. minA standing balance throughout                 Toilet Transfer: Minimal assistance;Stand-pivot;+2 for physical assistance;+2 for safety/equipment;BSC Toilet Transfer Details (indicate cue type and reason): verbal cues for technique with stand pivot, steadying assist throughout Toileting- Clothing Manipulation and Hygiene: Maximal assistance;+2 for physical assistance;+2 for safety/equipment;Sit to/from stand Toileting - Clothing Manipulation Details (indicate cue type and reason): assist for standing balance with additional assist for pericare      Functional mobility during ADLs: Moderate assistance;+2 for physical assistance;+2 for safety/equipment;Rolling walker       Vision       Perception     Praxis  Cognition Arousal/Alertness: Awake/alert Behavior During Therapy: WFL for tasks assessed/performed Overall Cognitive Status: Within Functional Limits for tasks assessed                                           Exercises Exercises: Other exercises Other Exercises Other Exercises: A/AA/PROM to all extremities within available limits   Shoulder Instructions       General Comments      Pertinent Vitals/ Pain       Pain Assessment: No/denies pain  Home Living   Living Arrangements: (two daughter and two grand children 14 and 58 years old live w)                       Bathroom Accessibility: Yes How Accessible: Accessible via walker            Prior Functioning/Environment              Frequency  Min 3X/week        Progress Toward Goals  OT Goals(current goals can now be found in the care plan section)  Progress towards OT goals: Progressing toward goals  Acute Rehab OT Goals Patient Stated Goal: return home, read, work in the flowers OT Goal Formulation: With patient Time For Goal Achievement: 08/23/19 Potential to Achieve Goals: Good  Plan Discharge plan remains appropriate    Co-evaluation    PT/OT/SLP Co-Evaluation/Treatment: Yes Reason for Co-Treatment: To address functional/ADL transfers;For patient/therapist safety   OT goals addressed during session: ADL's and self-care      AM-PAC OT "6 Clicks" Daily Activity     Outcome Measure   Help from another person eating meals?: A Little Help from another person taking care of personal grooming?: A Lot Help from another person toileting, which includes using toliet, bedpan, or urinal?: A Lot Help from another person bathing (including washing, rinsing, drying)?: A Lot Help from another person to put on and taking off regular upper body clothing?: A Lot Help from another person to put on and taking off regular lower body clothing?: A Lot 6 Click Score: 13    End of Session    OT Visit Diagnosis: Unsteadiness on feet (R26.81);Muscle weakness (generalized) (M62.81);Hemiplegia and hemiparesis;Other symptoms and signs involving cognitive function Hemiplegia - Right/Left:  Right Hemiplegia - dominant/non-dominant: Dominant Hemiplegia - caused by: Unspecified   Activity Tolerance Patient tolerated treatment well   Patient Left in chair;with call bell/phone within reach;with chair alarm set;with family/visitor present   Nurse Communication Mobility status        Time: 1227-1300 OT Time Calculation (min): 33 min  Charges: OT General Charges $OT Visit: 1 Visit OT Treatments $Self Care/Home Management : 8-22 mins  Lou Cal, OT Supplemental Rehabilitation Services Pager [phone] Office [phone]    Raymondo Band 08/12/2019, 1:10 PM

## 2019-08-12 NOTE — Progress Notes (Signed)
Jacqueline Ribas, MD  Physician  Physical Medicine and Rehabilitation  PMR Pre-admission  Signed  Date of Service:  08/12/2019 11:39 AM      Related encounter: Admission (Discharged) from 08/09/2019 in [facility]      Signed         Show:Clear all '[x]' Manual'[x]' Template'[x]' Copied  Added by: '[x]' Cristina Gong, RN'[x]' Raulkar, Clide Deutscher, MD  '[]' Hover for details PMR Admission Coordinator Pre-Admission Assessment  Patient: Jacqueline Leach is an 63 y.o., female MRN: [id number] DOB: 14-Jul-1956 Height: '5\' 3"'  (160 cm) Weight: 76.6 kg  Insurance Information  PRIMARY: uninsured      11/30 I contacted financial counselor, Bluford Main at [phone] for disability and medicaid applications. Patient will have a spend down for Medicaid  Medicaid Application Date:       Case Manager:  Disability Application Date:       Case Worker:   The Data Collection Information Summary for patients in Inpatient Rehabilitation Facilities with attached Privacy Act [place] Records was provided and verbally reviewed with: N/A  Emergency Contact Information         Contact Information    Name Relation Home Work Mobile   Lewis,[name] Daughter   [phone]   Janellie, [place] Daughter   [phone]      Current Medical History  Patient Admitting Diagnosis: CVA  History of Present Illness:63 year old right-handed female with history of diabetes mellitus noncompliant with medications, hypertension on no antihypertensive medications. Present[facility]/09/2019 from [facility] with sudden onset of right side weakness resulting in a fall and slurred speech as well as hematoma right leg. 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 Remote lacunar infarct in the right cerebral white matter. No hemorrhage or hydrocephalus noted. TPA was initiated but stopped due to some hematemesis and hematoma right thigh.  Echocardiogram with ejection fraction 65% without emboli. Carotid Dopplers unremarkable. CT of head outside hospital showed no large vessel occlusion. Admission chemistries with SARS coronavirus negative, hemoglobin A1c 11.8, WBC 10,900. Neurology follow-up maintained on aspirin and Plavix for CVA prophylaxis x3 weeks then aspirin alone. Subcutaneous Lovenox for DVT prophylaxis. Tolerating a regular diet.   Complete NIHSS TOTAL: 7  Patient's medical record from [facility]  has been reviewed by the rehabilitation admission coordinator and physician.  Past Medical History  History reviewed. No pertinent past medical history.  Family History   family history is not on file.  Prior Rehab/Hospitalizations Has the patient had prior rehab or hospitalizations prior to admission? Yes  Has the patient had major surgery during 100 days prior to admission? No              Current Medications  Current Facility-Administered Medications:    acetaminophen (TYLENOL) tablet 650 mg, 650 mg, Oral, Q4H PRN, 650 mg at 08/11/19 0825 **OR** acetaminophen (TYLENOL) 160 MG/5ML solution 650 mg, 650 mg, Per Tube, Q4H PRN **OR** acetaminophen (TYLENOL) suppository 650 mg, 650 mg, Rectal, Q4H PRN, Aroor, Lanice Schwab, MD   aspirin chewable tablet 81 mg, 81 mg, Oral, Daily, Sethi, Pramod S, MD, 81 mg at 08/12/19 1011   atorvastatin (LIPITOR) tablet 80 mg, 80 mg, Oral, q1800, Rinehuls, David L, PA-C, 80 mg at 08/11/19 1724   bisacodyl (DULCOLAX) EC tablet 5 mg, 5 mg, Oral, Daily PRN, Rosalin Hawking, MD   bisacodyl (DULCOLAX) suppository 10 mg, 10 mg, Rectal, Once, Rosalin Hawking, MD   Chlorhexidine Gluconate Cloth 2 % PADS 6 each, 6 each, Topical, Daily, Aroor, Lanice Schwab, MD, 6 each  at 08/11/19 1730   clopidogrel (PLAVIX) tablet 75 mg, 75 mg, Oral, Daily, Garvin Fila, MD, 75 mg at 08/12/19 1011   enoxaparin (LOVENOX) injection 40 mg, 40 mg, Subcutaneous, Q24H, Rosalin Hawking, MD, 40 mg at  08/12/19 1012   insulin aspart (novoLOG) injection 0-15 Units, 0-15 Units, Subcutaneous, TID WC, Rosalin Hawking, MD, 8 Units at 08/12/19 0842   insulin aspart (novoLOG) injection 0-5 Units, 0-5 Units, Subcutaneous, QHS, Rosalin Hawking, MD, 2 Units at 08/11/19 2152   insulin glargine (LANTUS) injection 12 Units, 12 Units, Subcutaneous, Daily, Rinehuls, David L, PA-C, 12 Units at 08/12/19 1012   metFORMIN (GLUCOPHAGE) tablet 500 mg, 500 mg, Oral, BID WC, Rosalin Hawking, MD, 500 mg at 08/12/19 0841   pantoprazole (PROTONIX) EC tablet 40 mg, 40 mg, Oral, Daily, Rosalin Hawking, MD, 40 mg at 08/12/19 1011  Patients Current Diet:     Diet Order                  Diet heart healthy/carb modified Room service appropriate? Yes with Assist; Fluid consistency: Thin  Diet effective now               Precautions / Restrictions Precautions Precautions: Fall Precaution Comments: right hemiparesis Restrictions Weight Bearing Restrictions: No   Has the patient had 2 or more falls or a fall with injury in the past year? Yes  Prior Activity Level Community (5-7x/wk): independent and driving; retired  Prior Functional Level Self Care: Did the patient need help bathing, dressing, using the toilet or eating? Independent  Indoor Mobility: Did the patient need assistance with walking from room to room (with or without device)? Independent  Stairs: Did the patient need assistance with internal or external stairs (with or without device)? Independent  Functional Cognition: Did the patient need help planning regular tasks such as shopping or remembering to take medications? Independent  Home Equities trader / Equipment Home Assistive Devices/Equipment: None Home Equipment: Shower seat - built in, [place] - single point  Prior Device Use: Indicate devices/aids used by the patient prior to current illness, exacerbation or injury? None of the above  Current Functional Level Cognition   Arousal/Alertness: Awake/alert Overall Cognitive Status: Within Functional Limits for tasks assessed Orientation Level: Oriented X4 General Comments: emotional lability end of session with reality of taking steps again    Extremity Assessment (includes Sensation/Coordination)  Upper Extremity Assessment: Generalized weakness, RUE deficits/detail RUE Deficits / Details: 1/5 MM grade for shoulder elevation minimal and minimal shoulder retraction RUE Coordination: decreased fine motor, decreased gross motor  Lower Extremity Assessment: Defer to PT evaluation, Generalized weakness RLE Deficits / Details: hip flexion 1/5, knee extension 2/5, knee flexion 2/5, dorsiflexion 1/5, no toe movement    ADLs  Overall ADL's : Needs assistance/impaired Eating/Feeding: Moderate assistance, Bed level, Cueing for safety Eating/Feeding Details (indicate cue type and reason): using LUE Grooming: Moderate assistance, Sitting, Cueing for safety Upper Body Bathing: Moderate assistance, Sitting, Cueing for safety Lower Body Bathing: Maximal assistance, Cueing for safety, Cueing for sequencing, Sitting/lateral leans, Sit to/from stand Upper Body Dressing : Moderate assistance, Sitting Lower Body Dressing: Maximal assistance, Sitting/lateral leans, Sit to/from stand Toilet Transfer: Minimal assistance, Moderate assistance, +2 for physical assistance, +2 for safety/equipment, Stand-pivot, Squat-pivot, Cueing for sequencing, Cueing for safety, BSC Toileting- Clothing Manipulation and Hygiene: Maximal assistance, +2 for physical assistance, +2 for safety/equipment, Sitting/lateral lean, Sit to/from stand, Cueing for safety, Cueing for sequencing Functional mobility during ADLs: Minimal assistance, Moderate assistance, +2 for physical  assistance, +2 for safety/equipment, Cueing for safety, Cueing for sequencing General ADL Comments: Pt limited by decreased strength, decreased mobility and decreased ability to care for  self. Pt very limited on R side; but L side remains strong to perform transfers.    Mobility  Overal bed mobility: Needs Assistance Bed Mobility: Rolling, Sidelying to Sit Rolling: Mod assist Sidelying to sit: Mod assist Supine to sit: Mod assist General bed mobility comments: in recliner on arrival    Transfers  Overall transfer level: Needs assistance Transfers: Sit to/from Stand, Stand Pivot Transfers Sit to Stand: Min assist Stand pivot transfers: Min assist Squat pivot transfers: Min assist, Mod assist General transfer comment: pt able to stand from recliner with cues for sequence and guarding for right knee stability. Sit to stand x 4 trials. Pt performed stand pivot toward Rt and LEFt for [facility] with cues for positioning and stepping with RLE with pt able to advance RLE with increased time    Ambulation / Gait / Stairs / Wheelchair Mobility  Ambulation/Gait Ambulation/Gait assistance: Mod assist Gait Distance (Feet): 15 Feet Assistive device: Bilateral platform walker(EVA walker) Gait Pattern/deviations: Step-to pattern, Decreased step length - right, Decreased stance time - right, Decreased stride length General Gait Details: pt able to step forward and back 2' with bil UE support on therapist with face to face technique with good stability in standing for RLE. Pt then performed gait 15' with EVA.  cues to step into EVA walker. Pt with reliance on UE support on EVA with pt able to advance RLE without assist 50% of the time with mod cues for positioning and safety. Gait velocity interpretation: <1.8 ft/sec, indicate of risk for recurrent falls    Posture / Balance Dynamic Sitting Balance Sitting balance - Comments: good sitting balance Balance Overall balance assessment: Needs assistance Sitting-balance support: Single extremity supported, No upper extremity supported Sitting balance-Leahy Scale: Good Sitting balance - Comments: good sitting balance Standing balance  support: Bilateral upper extremity supported, Single extremity supported Standing balance-Leahy Scale: Poor Standing balance comment: reliance on LUE assist for stability with weak RLE    Special needs/care consideration BiPAP/CPAP  CPM  Continuous Drip IV  Dialysis         Days  Life Vest  Oxygen  Special Bed  Trach Size  Wound Vac (area)       Location  Skin Right hip hematoma, ecchymosis                    Bowel mgmt:  LBM 11/26 continent Bladder mgmt: continent Diabetic mgmt: Hb A1c 11.8 Behavioral consideration emotionally labile Chemo/radiation  Designated visitor is daughter, [name]   Previous [place]: (two daughter and two grand children 37 and 53 years old live w)  Lives With: Family, Daughter Available Help at Discharge: Family, Available 24 hours/day Type of Home: House Home Layout: One level Home Access: Stairs to enter Entrance Stairs-Rails: None Technical brewer of Steps: 2-3 Bathroom Shower/Tub: Multimedia programmer: Standard Bathroom Accessibility: Yes How Accessible: Accessible via walker Home Care Services: No  Discharge Living Setting Plans for Discharge Living Setting: Patient's home(2 daughters and 2 grand children live with her) Type of Home at Discharge: House Discharge Home Layout: One level Discharge Home Access: Stairs to enter Entrance Stairs-Rails: None Entrance Stairs-Number of Steps: 2 to 3 Discharge Bathroom Shower/Tub: Walk-in shower Discharge Bathroom Toilet: Standard Discharge Bathroom Accessibility: Yes How Accessible: Accessible via walker Does the patient have any problems  obtaining your medications?: Yes (Describe)(uninsured)  Social/Family/Support Systems Patient Roles: Parent Contact Information: [name], is main contact; [name] is visitor  Anticipated Caregiver: [name]/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 so not visiting at this ime Anticipated Caregiver's Contact Information:  [phone] Ability/Limitations of Caregiver: [name] there 24/7; [name] works first shift Careers adviser: 24/7 Discharge Plan Discussed with Primary Caregiver: Yes Is Caregiver In Agreement with Plan?: Yes Does Caregiver/Family have Issues with Lodging/Transportation while Pt is in Rehab?: No  Goals/Additional Needs Patient/Family Goal for Rehab: supervision PT, supervision to [place] OT Expected length of stay: ELOS 10 to 14 days Pt/Family Agrees to Admission and willing to participate: Yes Program Orientation Provided & Reviewed with Pt/Caregiver Including Roles  & Responsibilities: Yes  Decrease burden of Care through IP rehab admission: n/a  Possible need for SNF placement upon discharge: n/a  Patient Condition: I have reviewed medical records from [facility] , spoken with patient and daughter. I met with patient at the bedside for inpatient rehabilitation assessment.  Patient will benefit from ongoing PT and OT, can actively participate in 3 hours of therapy a day 5 days of the week, and can make measurable gains during the admission.  Patient will also benefit from the coordinated team approach during an Inpatient Acute Rehabilitation admission.  The patient will receive intensive therapy as well as Rehabilitation physician, nursing, social worker, and care management interventions.  Due to bladder management, bowel management, safety, skin/wound care, disease management, medication administration, pain management and patient education the patient requires 24 hour a day rehabilitation nursing.  The patient is currently mod assist with mobility and basic ADLs.  Discharge setting and therapy post discharge at home with home health is anticipated.  Patient has agreed to participate in the Acute Inpatient Rehabilitation Program and will admit today.  Preadmission Screen Completed By:  Cleatrice Burke, 08/12/2019 11:40  AM ______________________________________________________________________   Discussed status with Dr. Ranell Patrick on  08/12/2019 at  1145 and received approval for admission today.  Admission Coordinator:  Cleatrice Burke, RN, time  9166 Date  08/12/2019   Assessment/Plan: Diagnosis: Right sided weakness and slurred speech secondary to left pontine acute perforator infarction 1. Does the need for close, 24 hr/day Medical supervision in concert with the patient's rehab needs make it unreasonable for this patient to be served in a less intensive setting? Yes 2. Co-Morbidities requiring supervision/potential complications: Type 2 DM, HTN 3. Due to safety, disease management, medication administration and patient education, does the patient require 24 hr/day rehab nursing? Yes 4. Does the patient require coordinated care of a physician, rehab nurse, PT and OT to address physical and functional deficits in the context of the above medical diagnosis(es)? Yes Addressing deficits in the following areas: balance, endurance, locomotion, strength, transferring, bathing, dressing, feeding, grooming, toileting and psychosocial support 5. Can the patient actively participate in an intensive therapy program of at least 3 hrs of therapy 5 days a week? Yes 6. The potential for patient to make measurable gains while on inpatient rehab is excellent 7. Anticipated functional outcomes upon discharge from inpatient rehab: modified independent PT, min assist OT, independent SLP 8. Estimated rehab length of stay to reach the above functional goals is: 10-14 days 9. Anticipated discharge destination: Home 10. Overall Rehab/Functional Prognosis: excellent   MD Signature: Leeroy Cha, MD        Revision History

## 2019-08-12 NOTE — H&P (Signed)
Physical Medicine and Rehabilitation Admission H&P    : HPI: Jacqueline Leach is a 63 year old right-handed female with history of diabetes mellitus noncompliant with medications, hypertension on no antihypertensive medications.  Per chart review she lives with her children and assistance as needed.  Reportedly independent prior to admission driving as well as independent ADLs.  She is a retired Merchandiser, retail.  1 level home 3 steps to entry.  Presented 08/09/2019 from [facility] with sudden onset of right side weakness resulting in a fall and slurred speech as well as hematoma right leg.  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  Remote lacunar infarct in the right cerebral white matter.  No hemorrhage or hydrocephalus noted.  TPA was initiated but stopped due to some hematemesis and hematoma right thigh.  Echocardiogram with ejection fraction 65% without emboli.  Carotid Dopplers unremarkable.  CT of head outside hospital showed no large vessel occlusion.  Admission chemistries with SARS coronavirus negative, hemoglobin A1c 11.8, WBC 10,900.  Neurology follow-up maintained on aspirin and Plavix for CVA prophylaxis x3 weeks then aspirin alone.  Subcutaneous Lovenox for DVT prophylaxis.  Tolerating a regular diet.  Therapy evaluations completed and patient was admitted for a comprehensive rehab program.  Review of Systems  Constitutional: Negative for chills and fever.  HENT: Negative for hearing loss.   Eyes: Negative for blurred vision and double vision.  Respiratory: Negative for cough and shortness of breath.   Cardiovascular: Positive for leg swelling. Negative for chest pain and palpitations.  Gastrointestinal: Positive for constipation. Negative for heartburn, nausea and vomiting.  Genitourinary: Negative for dysuria and flank pain.  Musculoskeletal: Positive for myalgias.       Fall resulting in a right thigh hematoma  Skin: Negative for rash.  Neurological:  Positive for speech change and focal weakness.  All other systems reviewed and are negative.  No past medical history on file. No past surgical history on file. No family history on file. Social History:  reports that she has never smoked. She has never used smokeless tobacco. No history on file for alcohol and drug. Allergies:  Allergies  Allergen Reactions  . Penicillins Rash    Did it involve swelling of the face/tongue/throat, SOB, or low BP? Yes Did it involve sudden or severe rash/hives, skin peeling, or any reaction on the inside of your mouth or nose? Yes Did you need to seek medical attention at a hospital or doctor's office? Yes When did it last happen?approx 63yo If all above answers are "NO", may proceed with cephalosporin use.   . Sulfa Antibiotics Rash   Medications Prior to Admission  Medication Sig Dispense Refill  . [START ON 08/13/2019] aspirin 81 MG chewable tablet Chew 1 tablet (81 mg total) by mouth daily.    Marland Kitchen atorvastatin (LIPITOR) 80 MG tablet Take 1 tablet (80 mg total) by mouth daily at 6 PM.    . [START ON 08/13/2019] clopidogrel (PLAVIX) 75 MG tablet Take 1 tablet (75 mg total) by mouth daily.    Derrill Memo ON 08/13/2019] enoxaparin (LOVENOX) 40 MG/0.4ML injection Inject 0.4 mLs (40 mg total) into the skin daily. 0 mL   . insulin aspart (NOVOLOG) 100 UNIT/ML injection Inject 0-15 Units into the skin 3 (three) times daily with meals. 10 mL 11  . insulin aspart (NOVOLOG) 100 UNIT/ML injection Inject 0-5 Units into the skin at bedtime. 10 mL 11  . [START ON 08/13/2019] insulin glargine (LANTUS) 100 UNIT/ML injection Inject 0.12 mLs (  12 Units total) into the skin daily. 10 mL 11  . metFORMIN (GLUCOPHAGE) 500 MG tablet Take 1 tablet (500 mg total) by mouth 2 (two) times daily with a meal.    . [START ON 08/13/2019] pantoprazole (PROTONIX) 40 MG tablet Take 1 tablet (40 mg total) by mouth daily.      Drug Regimen Review Drug regimen was reviewed and remains  appropriate with no significant issues identified  Home: Home Living Family/patient expects to be discharged to:: Private residence Living Arrangements: (P) (two daughter and two grand children 33 and 57 years old live w) Available Help at Discharge: Family, Available 24 hours/day Type of Home: House Home Access: Stairs to enter [employer] of Steps: 2-3 Entrance Stairs-Rails: None Home Layout: One level Bathroom Shower/Tub: Multimedia programmer: Programmer, systems: (P) Yes Home Equipment: Shower seat - built in, [place] - single point  Lives With: Family, Daughter   Functional History: Prior Function Level of Independence: Independent Comments: Driving, IADLs, ADLs, cleaning; retired as Merchandiser, retail; pt enjoys reading and enjoys Theatre manager Status:  Mobility: Bed Mobility Overal bed mobility: Needs Assistance Bed Mobility: Rolling, Sidelying to Sit Rolling: Mod assist Sidelying to sit: Mod assist Supine to sit: Mod assist General bed mobility comments: in recliner on arrival Transfers Overall transfer level: Needs assistance Transfers: Sit to/from Stand, Stand Pivot Transfers Sit to Stand: Min assist Stand pivot transfers: Min assist Squat pivot transfers: Min assist, Mod assist General transfer comment: pt able to stand from recliner with cues for sequence and guarding for right knee stability. Sit to stand x 4 trials. Pt performed stand pivot toward Rt and LEFt for [facility] with cues for positioning and stepping with RLE with pt able to advance RLE with increased time Ambulation/Gait Ambulation/Gait assistance: Mod assist Gait Distance (Feet): 15 Feet Assistive device: Bilateral platform walker(EVA walker) Gait Pattern/deviations: Step-to pattern, Decreased step length - right, Decreased stance time - right, Decreased stride length General Gait Details: pt able to step forward and back 2' with bil UE support on therapist with face to  face technique with good stability in standing for RLE. Pt then performed gait 15' with EVA.  cues to step into EVA walker. Pt with reliance on UE support on EVA with pt able to advance RLE without assist 50% of the time with mod cues for positioning and safety. Gait velocity interpretation: <1.8 ft/sec, indicate of risk for recurrent falls  ADL: ADL Overall ADL's : Needs assistance/impaired Eating/Feeding: Moderate assistance, Bed level, Cueing for safety Eating/Feeding Details (indicate cue type and reason): using LUE Grooming: Moderate assistance, Sitting, Cueing for safety Upper Body Bathing: Moderate assistance, Sitting, Cueing for safety Lower Body Bathing: Maximal assistance, Cueing for safety, Cueing for sequencing, Sitting/lateral leans, Sit to/from stand Upper Body Dressing : Moderate assistance, Sitting Lower Body Dressing: Maximal assistance, Sitting/lateral leans, Sit to/from stand Toilet Transfer: Minimal assistance, Moderate assistance, +2 for physical assistance, +2 for safety/equipment, Stand-pivot, Squat-pivot, Cueing for sequencing, Cueing for safety, BSC Toileting- Clothing Manipulation and Hygiene: Maximal assistance, +2 for physical assistance, +2 for safety/equipment, Sitting/lateral lean, Sit to/from stand, Cueing for safety, Cueing for sequencing Functional mobility during ADLs: Minimal assistance, Moderate assistance, +2 for physical assistance, +2 for safety/equipment, Cueing for safety, Cueing for sequencing General ADL Comments: Pt limited by decreased strength, decreased mobility and decreased ability to care for self. Pt very limited on R side; but L side remains strong to perform transfers.  Cognition: Cognition Overall Cognitive Status: Within  Functional Limits for tasks assessed Arousal/Alertness: Awake/alert Orientation Level: Oriented X4 Cognition Arousal/Alertness: Awake/alert Behavior During Therapy: WFL for tasks assessed/performed Overall Cognitive  Status: Within Functional Limits for tasks assessed General Comments: emotional lability end of session with reality of taking steps again  Physical Exam: Blood pressure (!) 158/71, pulse 93, temperature 98.4 F (36.9 C), temperature source Oral, resp. rate 19, height 5\' 3"  (1.6 m), weight 76.6 kg, SpO2 97 %.   Physical Exam  Neurological:  Patient is alert no acute distress.  Follows commands.  Oriented x3  CN 2-12 intact with the exceptio of mild right-sided facial droop Gen: no distress, normal appearing HEENT: oral mucosa pink and moist, NCAT Cardio: Reg rate Chest: normal effort, normal rate of breathing Abd: soft, non-distended Ext: no edema Skin: intact Neuro: Musculoskeletal:  5/5 strength on left side. RUE: 2/5 EF, EE, WF RLE: 3/5 HF, KE, 0/5 DF. Psych: pleasant, normal affect. Becomes appropriately tearful when discussing her goal to regain function in her right arm.    Results for orders placed or performed during the hospital encounter of 08/09/19 (from the past 48 hour(s))  Glucose, capillary     Status: Abnormal   Collection Time: 08/10/19  4:35 PM  Result Value Ref Range   Glucose-Capillary 243 (H) 70 - 99 mg/dL  Glucose, capillary     Status: Abnormal   Collection Time: 08/10/19 10:04 PM  Result Value Ref Range   Glucose-Capillary 189 (H) 70 - 99 mg/dL  Basic metabolic panel     Status: Abnormal   Collection Time: 08/11/19  4:10 AM  Result Value Ref Range   Sodium 138 135 - 145 mmol/L   Potassium 3.6 3.5 - 5.1 mmol/L   Chloride 105 98 - 111 mmol/L   CO2 24 22 - 32 mmol/L   Glucose, Bld 289 (H) 70 - 99 mg/dL   BUN 15 8 - 23 mg/dL   Creatinine, Ser 0.56 0.44 - 1.00 mg/dL   Calcium 8.4 (L) 8.9 - 10.3 mg/dL   GFR calc non Af Amer >60 >60 mL/min   GFR calc Af Amer >60 >60 mL/min   Anion gap 9 5 - 15    Comment: Performed at [facility], [place] [address] [place],  [address]  CBC     Status: Abnormal   Collection Time: 08/11/19  4:10 AM   Result Value Ref Range   WBC 10.3 4.0 - 10.5 K/uL   RBC 3.89 3.87 - 5.11 MIL/uL   Hemoglobin 11.5 (L) 12.0 - 15.0 g/dL   HCT 34.4 (L) 36.0 - 46.0 %   MCV 88.4 80.0 - 100.0 fL   MCH 29.6 26.0 - 34.0 pg   MCHC 33.4 30.0 - 36.0 g/dL   RDW 12.5 11.5 - 15.5 %   Platelets 185 150 - 400 K/uL   nRBC 0.0 0.0 - 0.2 %    Comment: Performed at [facility], [place] [address] [place], [place] [address]  Glucose, capillary     Status: Abnormal   Collection Time: 08/11/19  7:37 AM  Result Value Ref Range   Glucose-Capillary 251 (H) 70 - 99 mg/dL  Glucose, capillary     Status: Abnormal   Collection Time: 08/11/19 11:13 AM  Result Value Ref Range   Glucose-Capillary 241 (H) 70 - 99 mg/dL  Glucose, capillary     Status: Abnormal   Collection Time: 08/11/19  4:03 PM  Result Value Ref Range   Glucose-Capillary 318 (H) 70 -  99 mg/dL  Glucose, capillary     Status: Abnormal   Collection Time: 08/11/19  9:14 PM  Result Value Ref Range   Glucose-Capillary 219 (H) 70 - 99 mg/dL   Comment 1 Notify RN    Comment 2 Document in Chart   Basic metabolic panel     Status: Abnormal   Collection Time: 08/12/19  3:35 AM  Result Value Ref Range   Sodium 138 135 - 145 mmol/L   Potassium 3.4 (L) 3.5 - 5.1 mmol/L   Chloride 103 98 - 111 mmol/L   CO2 24 22 - 32 mmol/L   Glucose, Bld 218 (H) 70 - 99 mg/dL   BUN 18 8 - 23 mg/dL   Creatinine, Ser 0.50 0.44 - 1.00 mg/dL   Calcium 8.4 (L) 8.9 - 10.3 mg/dL   GFR calc non Af Amer >60 >60 mL/min   GFR calc Af Amer >60 >60 mL/min   Anion gap 11 5 - 15    Comment: Performed at [facility], [place] [address] [place], [place] [address]  CBC     Status: Abnormal   Collection Time: 08/12/19  3:35 AM  Result Value Ref Range   WBC 11.6 (H) 4.0 - 10.5 K/uL   RBC 3.79 (L) 3.87 - 5.11 MIL/uL   Hemoglobin 11.4 (L) 12.0 - 15.0 g/dL   HCT 32.9 (L) 36.0 - 46.0 %   MCV 86.8 80.0 - 100.0 fL   MCH 30.1 26.0 - 34.0 pg   MCHC 34.7 30.0 - 36.0 g/dL   RDW 12.3 11.5 -  15.5 %   Platelets 188 150 - 400 K/uL   nRBC 0.0 0.0 - 0.2 %    Comment: Performed at [facility], [place] [address], [place], [place] [address]  Glucose, capillary     Status: Abnormal   Collection Time: 08/12/19  8:11 AM  Result Value Ref Range   Glucose-Capillary 276 (H) 70 - 99 mg/dL  Glucose, capillary     Status: Abnormal   Collection Time: 08/12/19 11:42 AM  Result Value Ref Range   Glucose-Capillary 201 (H) 70 - 99 mg/dL   No results found.   Medical Problem List and Plan: 1.  Right side weakness and slurred speech secondary to left pontine acute perforator infarction  -patient may shower  -ELOS/Goals: modI in PT, MinA in OT, I in SLP 2.  Antithrombotics: -DVT/anticoagulation: Lovenox  -antiplatelet therapy: Aspirin and Plavix x3 weeks then aspirin alone 3. Pain Management: Tylenol as needed 4. Mood: Provide emotional support  -antipsychotic agents: N/A 5. Neuropsych: This patient is capable of making decisions on her own behalf. 6. Skin/Wound Care: Routine skin checks 7. Fluids/Electrolytes/Nutrition: Routine in and outs with follow-up chemistries 8.  Diabetes mellitus.  Hemoglobin A1c 11.8.  Lantus insulin 12 units daily, Glucophage 500 mg twice daily.  Diabetic teaching 9.  Hyperlipidemia.  Lipitor. 10. HTN: She was on no home medications, but is currently hypertensive. Neurology recommends permissive HTN for 1 week, in 2nd week of rehab will add hypertensive agent if BP still elevated.  10. Education: Will require stroke prevention education and education regarding the importance of good control of her HTN and DM. She was noncompliant with DM medications at home and continues to be hypertensive.    Lavon Paganini Angiulli, PA-C 08/12/2019   I have personally performed a face to face diagnostic evaluation, including, but not limited to relevant history and physical exam findings, of this patient and developed relevant assessment  and plan.  Additionally, I have reviewed  and concur with the physician assistant's documentation above. The patient's status has not changed. The original post admission physician evaluation remains appropriate, and any changes from the pre-admission screening or documentation from the acute chart are noted above.   Leeroy Cha, MD

## 2019-08-12 NOTE — Progress Notes (Signed)
Pt arrived to unit via w/c. Pt is alert and oriented x 4 and able to make needs known. Skin is intact with large outlined hematoma to right hip. Pt oriented to IP rehab. Pt needs met, call bell in reach, SR up x 2, alarm on and functioning.

## 2019-08-12 NOTE — Progress Notes (Signed)
Inpatient Diabetes Program Recommendations  AACE/ADA: New Consensus Statement on Inpatient Glycemic Control (2015)  Target Ranges:  Prepandial:   less than 140 mg/dL      Peak postprandial:   less than 180 mg/dL (1-2 hours)      Critically ill patients:  140 - 180 mg/dL   Lab Results  Component Value Date   GLUCAP 201 (H) 08/12/2019   HGBA1C 11.8 (H) 08/09/2019    Review of Glycemic Control Results for Jacqueline Leach, Jacqueline Leach (MRN [id number]) as of 08/12/2019 14:11  Ref. Range 08/11/2019 21:14 08/12/2019 08:11 08/12/2019 11:42  Glucose-Capillary Latest Ref Range: 70 - 99 mg/dL 219 (H) 276 (H) 201 (H)   Diabetes history: Type 2 DM Outpatient Diabetes medications: none Current orders for Inpatient glycemic control: Lantus 12 units QD, Novolog 0-15 units TID, Novolog 0-5 units QHS, Metformin 500 mg BID  Inpatient Diabetes Program Recommendations:    Spoke with patient briefly regarding outpatient management of diabetes. Patient states she had prescriptions at [facility], but never picked them up. She states, "This is all my fault, I don't know why I did not get them. Now I wish I had." Denies this was related to lack of transportation or payment.  Reviewed patient's current A1c of 11.8%. Explained what a A1c is and what it measures. Also reviewed goal A1c with patient, importance of good glucose control @ home, and blood sugar goals. Reviewed patho of DM, need for insulin, role of pancreas, hyperglycemia, signs and symptoms, vascular changes and commorbidities. Patient will need a meter at discharge. Blood glucose meter can be purchased at [employer]. Relion information attached. Encouraged to begin practicing checking CBGs in right hand today.  Patient admits to drinking sugary beverages including Coke and juices. Discussed finding alternatives in an effort to limit sugar intake from beverages. Education provided on Plate method and goal setting. Will also place consult for dietitian.   Will plan  to continue to follow.   Thanks, Bronson Curb, MSN, RNC-OB Diabetes Coordinator [phone] (8a-5p)

## 2019-08-12 NOTE — Discharge Instructions (Signed)
Blood Glucose Monitoring, Adult °Monitoring your blood sugar (glucose) is an important part of managing your diabetes (diabetes mellitus). Blood glucose monitoring involves checking your blood glucose as often as directed and keeping a record (log) of your results over time. °Checking your blood glucose regularly and keeping a blood glucose log can: °· Help you and your health care provider adjust your diabetes management plan as needed, including your medicines or insulin. °· Help you understand how food, exercise, illnesses, and medicines affect your blood glucose. °· Let you know what your blood glucose is at any time. You can quickly find out if you have low blood glucose (hypoglycemia) or high blood glucose (hyperglycemia). °Your health care provider will set individualized treatment goals for you. Your goals will be based on your age, other medical conditions you have, and how you respond to diabetes treatment. Generally, the goal of treatment is to maintain the following blood glucose levels: °· Before meals (preprandial): 80-130 mg/dL (4.4-7.2 mmol/L). °· After meals (postprandial): below 180 mg/dL (10 mmol/L). °· A1c level: less than 7%. °Supplies needed: °· Blood glucose meter. °· Test strips for your meter. Each meter has its own strips. You must use the strips that came with your meter. °· A needle to prick your finger (lancet). Do not use a lancet more than one time. °· A device that holds the lancet (lancing device). °· A journal or log book to write down your results. °How to check your blood glucose ° °1. Wash your hands with soap and water. °2. Prick the side of your finger (not the tip) with the lancet. Use a different finger each time. °3. Gently rub the finger until a small drop of blood appears. °4. Follow instructions that come with your meter for inserting the test strip, applying blood to the strip, and using your blood glucose meter. °5. Write down your result and any notes. °Some meters  allow you to use areas of your body other than your finger (alternative sites) to test your blood. The most common alternative sites are: °· Forearm. °· Thigh. °· Palm of the hand. °If you think you may have hypoglycemia, or if you have a history of not knowing when your blood glucose is getting low (hypoglycemia unawareness), do not use alternative sites. Use your finger instead. Alternative sites may not be as accurate as the fingers, because blood flow is slower in these areas. This means that the result you get may be delayed, and it may be different from the result that you would get from your finger. °Follow these instructions at home: °Blood glucose log ° °· Every time you check your blood glucose, write down your result. Also write down any notes about things that may be affecting your blood glucose, such as your diet and exercise for the day. This information can help you and your health care provider: °? Look for patterns in your blood glucose over time. °? Adjust your diabetes management plan as needed. °· Check if your meter allows you to download your records to a computer. Most glucose meters store a record of glucose readings in the meter. °If you have type 1 diabetes: °· Check your blood glucose 2 or more times a day. °· Also check your blood glucose: °? Before every insulin injection. °? Before and after exercise. °? Before meals. °? 2 hours after a meal. °? Occasionally between 2:00 a.m. and 3:00 a.m., as directed. °? Before potentially dangerous tasks, like driving or using heavy machinery. °?   At bedtime.  You may need to check your blood glucose more often, up to 6-10 times a day, if you: ? Use an insulin pump. ? Need multiple daily injections (MDI). ? Have diabetes that is not well-controlled. ? Are ill. ? Have a history of severe hypoglycemia. ? Have hypoglycemia unawareness. If you have type 2 diabetes:  If you take insulin or other diabetes medicines, check your blood glucose 2 or  more times a day.  If you are on intensive insulin therapy, check your blood glucose 4 or more times a day. Occasionally, you may also need to check between 2:00 a.m. and 3:00 a.m., as directed.  Also check your blood glucose: ? Before and after exercise. ? Before potentially dangerous tasks, like driving or using heavy machinery.  You may need to check your blood glucose more often if: ? Your medicine is being adjusted. ? Your diabetes is not well-controlled. ? You are ill. General tips  Always keep your supplies with you.  If you have questions or need help, all blood glucose meters have a 24-hour "hotline" phone number that you can call. You may also contact your health care provider.  After you use a few boxes of test strips, adjust (calibrate) your blood glucose meter by following instructions that came with your meter. Contact a health care provider if:  Your blood glucose is at or above 240 mg/dL (13.3 mmol/L) for 2 days in a row.  You have been sick or have had a fever for 2 days or longer, and you are not getting better.  You have any of the following problems for more than 6 hours: ? You cannot eat or drink. ? You have nausea or vomiting. ? You have diarrhea. Get help right away if:  Your blood glucose is lower than 54 mg/dL (3 mmol/L).  You become confused or you have trouble thinking clearly.  You have difficulty breathing.  You have moderate or large ketone levels in your urine. Summary  Monitoring your blood sugar (glucose) is an important part of managing your diabetes (diabetes mellitus).  Blood glucose monitoring involves checking your blood glucose as often as directed and keeping a record (log) of your results over time.  Your health care provider will set individualized treatment goals for you. Your goals will be based on your age, other medical conditions you have, and how you respond to diabetes treatment.  Every time you check your blood glucose,  write down your result. Also write down any notes about things that may be affecting your blood glucose, such as your diet and exercise for the day. This information is not intended to replace advice given to you by your health care provider. Make sure you discuss any questions you have with your health care provider. Document Released: 09/01/2003 Document Revised: 06/22/2018 Document Reviewed: 02/08/2016 Elsevier Patient Education  [place].    Insulin Injection Instructions, Using Insulin Pens, Adult A subcutaneous injection is a shot of medicine that is injected into the layer of fat and tissue between skin and muscle. People with type 1 diabetes must take insulin because their bodies do not make it. People with type 2 diabetes may need to take insulin. There are many different types of insulin. The type of insulin that you take may determine how many injections you give yourself and when you need to give the injections. Supplies needed:  Soap and water to wash hands.  Your insulin pen.  A new, unused needle.  Alcohol wipes.  A disposal container that is meant for sharp items (sharps container), such as an empty plastic bottle with a cover. How to choose a site for injection The body absorbs insulin differently, depending on where the insulin is injected (injection site). It is best to inject insulin into the same body area each time (for example, always in the abdomen), but you should use a different spot in that area for each injection. Do not inject the insulin in the same spot each time. There are five main areas that can be used for injecting. These areas include:  Abdomen. This is the preferred area.  Front of thigh.  Upper, outer side of thigh.  Upper, outer side of arm.  Upper, outer part of buttock. How to use an insulin pen  First, follow the steps for Get ready, then continue with the steps for Inject the insulin. Get ready 1. Wash your hands with soap and  water. If soap and water are not available, use hand sanitizer. 2. Before you give yourself an insulin injection, be sure to test your blood sugar level (blood glucose level) and write down that number. Follow any instructions from your health care provider about what to do if your blood glucose level is higher or lower than your normal range. 3. Check the expiration date and the type of insulin that is in the pen. 4. If you are using CLEAR insulin, check to see that it is clear and free of clumps. 5. If you are using CLOUDY insulin, do not shake the pen to get the injection ready. Instead, get it ready in one of these ways: ? Gently roll the pen between your palms several times. ? Tip the pen up and down several times. 6. Remove the cap from the insulin pen. 7. Use an alcohol wipe to clean the rubber tip of the pen. 8. Remove the protective paper tab from the disposable needle. Do not let the needle touch anything. 9. Screw a new, unused needle onto the pen. 10. Remove the outer plastic needle cover. Do not throw away the outer plastic cover yet. ? If the pen uses a special safety needle, leave the inner needle shield in place. ? If the pen does not use a special safety needle, remove the inner plastic cover from the needle. 11. Follow the manufacturer's instructions to prime the insulin pen with the volume of insulin needed. Hold the pen with the needle pointing up, and push the button on the opposite end of the pen until a drop of insulin appears at the needle tip. If no insulin appears, repeat this step. 12. Turn the button (dial) to the number of units of insulin that you will be injecting. Inject the insulin 6. Use an alcohol wipe to clean the site where you will be injecting the needle. Let the site air-dry. 7. Hold the pen in the palm of your writing hand like a pencil. 8. If directed by your health care provider, use your other hand to pinch and hold about an inch (2.5 cm) of skin at the  injection site. Do not directly touch the cleaned part of the skin. 9. Gently but quickly, use your writing hand to put the needle straight into the skin. The needle should be at a 90-degree angle (perpendicular) to the skin. 10. When the needle is completely inserted into the skin, use your thumb or index finger of your writing hand to push the top button of the pen down  all the way to inject the insulin. 11. Let go of the skin that you are pinching. Continue to hold the pen in place with your writing hand. 12. Wait 10 seconds, then pull the needle straight out of the skin. This will allow all of the insulin to go from the pen and needle into your body. 13. Carefully put the larger (outer) plastic cover of the needle back over the needle, then unscrew the capped needle and discard it in a sharps container, such as an empty plastic bottle with a cover. 14. Put the plastic cap back on the insulin pen. How to throw away supplies  Discard all used needles in a puncture-proof sharps disposal container. You can ask your local pharmacy about where you can get this kind of disposal container, or you can use an empty plastic liquid laundry detergent bottle that has a cover.  Follow the disposal regulations for the area where you live. Do not use any needle more than one time.  Throw away empty disposable pens in the regular trash. Questions to ask your health care provider  How often should I be taking insulin?  How often should I check my blood glucose?  What amount of insulin should I be taking at each time?  What are the side effects?  What should I do if my blood glucose is too high?  What should I do if my blood glucose is too low?  What should I do if I forget to take my insulin?  What number should I call if I have questions? Where to find more information  American Diabetes Association (ADA): www.diabetes.org  American Association of Diabetes Educators (AADE) Patient Resources:  https://www.diabeteseducator.org Summary  A subcutaneous injection is a shot of medicine that is injected into the layer of fat and tissue between skin and muscle.  Before you give yourself an insulin injection, be sure to test your blood sugar level (blood glucose level) and write down that number.  Check the expiration date and the type of insulin that is in the pen. The type of insulin that you take may determine how many injections you give yourself and when you need to give the injections.  It is best to inject insulin into the same body area each time (for example, always in the abdomen), but you should use a different spot in that area for each injection. This information is not intended to replace advice given to you by your health care provider. Make sure you discuss any questions you have with your health care provider. Document Released: 10/02/2015 Document Revised: 09/18/2017 Document Reviewed: 10/02/2015 Elsevier Patient Education  [place].  Hyperglycemia Hyperglycemia occurs when the level of sugar (glucose) in the blood is too high. Glucose is a type of sugar that provides the body's main source of energy. Certain hormones (insulin and glucagon) control the level of glucose in the blood. Insulin lowers blood glucose, and glucagon increases blood glucose. Hyperglycemia can result from having too little insulin in the bloodstream, or from the body not responding normally to insulin. Hyperglycemia occurs most often in people who have diabetes (diabetes mellitus), but it can happen in people who do not have diabetes. It can develop quickly, and it can be life-threatening if it causes you to become severely dehydrated (diabetic ketoacidosis or hyperglycemic hyperosmolar state). Severe hyperglycemia is a medical emergency. What are the causes? If you have diabetes, hyperglycemia may be caused by:  Diabetes medicine.  Medicines that increase blood glucose  or affect your  diabetes control.  Not eating enough, or not eating often enough.  Changes in physical activity level.  Being sick or having an infection. If you have prediabetes or undiagnosed diabetes:  Hyperglycemia may be caused by those conditions. If you do not have diabetes, hyperglycemia may be caused by:  Certain medicines, including steroid medicines, beta-blockers, epinephrine, and thiazide diuretics.  Stress.  Serious illness.  Surgery.  Diseases of the pancreas.  Infection. What increases the risk? Hyperglycemia is more likely to develop in people who have risk factors for diabetes, such as:  Having a family member with diabetes.  Having a gene for type 1 diabetes that is passed from parent to child (inherited).  Living in an area with cold weather conditions.  Exposure to certain viruses.  Certain conditions in which the body's disease-fighting (immune) system attacks itself (autoimmune disorders).  Being overweight or obese.  Having an inactive (sedentary) lifestyle.  Having been diagnosed with insulin resistance.  Having a history of prediabetes, gestational diabetes, or polycystic ovarian syndrome (PCOS).  Being of American-Indian, African-American, Hispanic/Latino, or Asian/Pacific Islander descent. What are the signs or symptoms? Hyperglycemia may not cause any symptoms. If you do have symptoms, they may include early warning signs, such as:  Increased thirst.  Hunger.  Feeling very tired.  Needing to urinate more often than usual.  Blurry vision. Other symptoms may develop if hyperglycemia gets worse, such as:  Dry mouth.  Loss of appetite.  Fruity-smelling breath.  Weakness.  Unexpected or rapid weight gain or weight loss.  Tingling or numbness in the hands or feet.  Headache.  Skin that does not quickly return to normal after being lightly pinched and released (poor skin turgor).  Abdominal pain.  Cuts or bruises that are slow to  heal. How is this diagnosed? Hyperglycemia is diagnosed with a blood test to measure your blood glucose level. This blood test is usually done while you are having symptoms. Your health care provider may also do a physical exam and review your medical history. You may have more tests to determine the cause of your hyperglycemia, such as:  A fasting blood glucose (FBG) test. You will not be allowed to eat (you will fast) for at least 8 hours before a blood sample is taken.  An A1c (hemoglobin A1c) blood test. This provides information about blood glucose control over the previous 2-3 months.  An oral glucose tolerance test (OGTT). This measures your blood glucose at two times: ? After fasting. This is your baseline blood glucose level. ? Two hours after drinking a beverage that contains glucose. How is this treated? Treatment depends on the cause of your hyperglycemia. Treatment may include:  Taking medicine to regulate your blood glucose levels. If you take insulin or other diabetes medicines, your medicine or dosage may be adjusted.  Lifestyle changes, such as exercising more, eating healthier foods, or losing weight.  Treating an illness or infection, if this caused your hyperglycemia.  Checking your blood glucose more often.  Stopping or reducing steroid medicines, if these caused your hyperglycemia. If your hyperglycemia becomes severe and it results in hyperglycemic hyperosmolar state, you must be hospitalized and given IV fluids. Follow these instructions at home:  General instructions  Take over-the-counter and prescription medicines only as told by your health care provider.  Do not use any products that contain nicotine or tobacco, such as cigarettes and e-cigarettes. If you need help quitting, ask your health care provider.  Limit alcohol  intake to no more than 1 drink per day for nonpregnant women and 2 drinks per day for men. One drink equals 12 oz of beer, 5 oz of wine,  or 1 oz of hard liquor.  Learn to manage stress. If you need help with this, ask your health care provider.  Keep all follow-up visits as told by your health care provider. This is important. Eating and drinking   Maintain a healthy weight.  Exercise regularly, as directed by your health care provider.  Stay hydrated, especially when you exercise, get sick, or spend time in hot temperatures.  Eat healthy foods, such as: ? Lean proteins. ? Complex carbohydrates. ? Fresh fruits and vegetables. ? Low-fat dairy products. ? Healthy fats.  Drink enough fluid to keep your urine clear or pale yellow. If you have diabetes:  Make sure you know the symptoms of hyperglycemia.  Follow your diabetes management plan, as told by your health care provider. Make sure you: ? Take your insulin and medicines as directed. ? Follow your exercise plan. ? Follow your meal plan. Eat on time, and do not skip meals. ? Check your blood glucose as often as directed. Make sure to check your blood glucose before and after exercise. If you exercise longer or in a different way than usual, check your blood glucose more often. ? Follow your sick day plan whenever you cannot eat or drink normally. Make this plan in advance with your health care provider.  Share your diabetes management plan with people in your workplace, school, and household.  Check your urine for ketones when you are ill and as told by your health care provider.  Carry a medical alert card or wear medical alert jewelry. Contact a health care provider if:  Your blood glucose is at or above 240 mg/dL (13.3 mmol/L) for 2 days in a row.  You have problems keeping your blood glucose in your target range.  You have frequent episodes of hyperglycemia. Get help right away if:  You have difficulty breathing.  You have a change in how you think, feel, or act (mental status).  You have nausea or vomiting that does not go away. These symptoms  may represent a serious problem that is an emergency. Do not wait to see if the symptoms will go away. Get medical help right away. Call your local emergency services (911 in the [place] Do not drive yourself to the hospital. Summary  Hyperglycemia occurs when the level of sugar (glucose) in the blood is too high.  Hyperglycemia is diagnosed with a blood test to measure your blood glucose level. This blood test is usually done while you are having symptoms. Your health care provider may also do a physical exam and review your medical history.  If you have diabetes, follow your diabetes management plan as told by your health care provider.  Contact your health care provider if you have problems keeping your blood glucose in your target range. This information is not intended to replace advice given to you by your health care provider. Make sure you discuss any questions you have with your health care provider. Document Released: 02/22/2001 Document Revised: 05/16/2016 Document Reviewed: 05/16/2016 Elsevier Patient Education  [place].  Hemoglobin A1c Test Why am I having this test? You may have the hemoglobin A1c test (HbA1c test) done to: Evaluate your risk for developing diabetes (diabetes mellitus). Diagnose diabetes. Monitor long-term control of blood sugar (glucose) in people who have diabetes and help make  treatment decisions. This test may be done with other blood glucose tests, such as fasting blood glucose and oral glucose tolerance tests. What is being tested? Hemoglobin is a type of protein in the blood that carries oxygen. Glucose attaches to hemoglobin to form glycated hemoglobin. This test checks the amount of glycated hemoglobin in your blood, which is a good indicator of the average amount of glucose in your blood during the past 2-3 months. What kind of sample is taken?  A blood sample is required for this test. It is usually collected by inserting a needle into a  blood vessel. Tell a health care provider about: All medicines you are taking, including vitamins, herbs, eye drops, creams, and over-the-counter medicines. Any blood disorders you have. Any surgeries you have had. Any medical conditions you have. Whether you are pregnant or may be pregnant. How are the results reported? Your results will be reported as a percentage that indicates how much of your hemoglobin has glucose attached to it (is glycated). Your health care provider will compare your results to normal ranges that were established after testing a large group of people (reference ranges). Reference ranges may vary among labs and hospitals. For this test, common reference ranges are: Adult or child without diabetes: 4-5.6%. Adult or child with diabetes and good blood glucose control: less than 7%. What do the results mean? If you have diabetes: A result of less than 7% is considered normal, meaning that your blood glucose is well controlled. A result higher than 7% means that your blood glucose is not well controlled, and your treatment plan may need to be adjusted. If you do not have diabetes: A result within the reference range is considered normal, meaning that you are not at high risk for diabetes. A result of 5.7-6.4% means that you have a high risk of developing diabetes, and you may have prediabetes. Prediabetes is the condition of having a blood glucose level that is higher than it should be, but not high enough for you to be diagnosed with diabetes. Having prediabetes puts you at risk for developing type 2 diabetes (type 2 diabetes mellitus). You may have more tests, including a repeat HbA1c test. Results of 6.5% or higher on two separate HbA1c tests mean that you have diabetes. You may have more tests to confirm the diagnosis. Abnormally low HbA1c values may be caused by: Pregnancy. Severe blood loss. Receiving donated blood (transfusions). Low red blood cell count  (anemia). Long-term kidney failure. Some unusual forms (variants) of hemoglobin. Talk with your health care provider about what your results mean. Questions to ask your health care provider Ask your health care provider, or the department that is doing the test: When will my results be ready? How will I get my results? What are my treatment options? What other tests do I need? What are my next steps? Summary The hemoglobin A1c test (HbA1c test) may be done to evaluate your risk for developing diabetes, to diagnose diabetes, and to monitor long-term control of blood sugar (glucose) in people who have diabetes and help make treatment decisions. Hemoglobin is a type of protein in the blood that carries oxygen. Glucose attaches to hemoglobin to form glycated hemoglobin. This test checks the amount of glycated hemoglobin in your blood, which is a good indicator of the average amount of glucose in your blood during the past 2-3 months. Talk with your health care provider about what your results mean. This information is not intended to replace advice  given to you by your health care provider. Make sure you discuss any questions you have with your health care provider. Document Released: 09/20/2004 Document Revised: 08/11/2017 Document Reviewed: 04/11/2017 Elsevier Patient Education  2020 [employer].

## 2019-08-12 NOTE — Progress Notes (Signed)
Physical Therapy Treatment Patient Details Name: Jacqueline Leach MRN: [id number] DOB: 22-May-1956 Today's Date: 08/12/2019    History of Present Illness 63 y.o. female with past medical history significant for arthritis, DM noncompliant with her medications, possibly hypertension presented to [facility] ED with sudden onset weakness and slurred speech resulting in her falling at 6:30 PM on 08/08/19. tPA given however, instead of infusion being given 62 mL/h TPA rate was set for 6.8 ml/hr, and so patient still had not completed TPA on arrival to [facility].  In addition patient had some hematemesis as well as hematoma on the right leg and therefore TPA was held. MRI : Left pontine infarct    PT Comments    Pt shows good improvement, R side coming along slowly, but noticeably improved.  Emphasis on transition to EOB, sitting balance, scooting to EOB, transfers to [facility], sit to stand and gait training in the RW.    Follow Up Recommendations  CIR;Supervision/Assistance - 24 hour     Equipment Recommendations  Other (comment)    Recommendations for Other Services Rehab consult     Precautions / Restrictions Precautions Precautions: Fall Precaution Comments: right hemiparesis Restrictions Weight Bearing Restrictions: No    Mobility  Bed Mobility Overal bed mobility: Needs Assistance Bed Mobility: Rolling;Sidelying to Sit Rolling: Mod assist Sidelying to sit: Mod assist       General bed mobility comments: assist for RUE towards L side, assist for trunk elevation   Transfers Overall transfer level: Needs assistance Equipment used: Rolling walker (2 wheeled);1 person hand held assist Transfers: Sit to/from [employer] Transfers Sit to Stand: Min assist;+2 safety/equipment Stand pivot transfers: Min assist;+2 safety/equipment       General transfer comment: boosting and steadying assist to stand, VCs for UE placement and sequencing steps for stand pivot to [facility];  requires increased assist for mobility d/t difficulty advancing RLE  Ambulation/Gait Ambulation/Gait assistance: Mod assist;+2 physical assistance(1 for stability and w/shift assist, 1 person for adv R LE) Gait Distance (Feet): 50 Feet Assistive device: Rolling walker (2 wheeled) Gait Pattern/deviations: Step-to pattern;Decreased step length - right;Decreased step length - left;Decreased stance time - right;Decreased weight shift to right   Gait velocity interpretation: <1.31 ft/sec, indicative of household ambulator General Gait Details: Paretic gait pattern with assist need to advance R LE, control of Right knee snap into hyperextension, truncal control of posture   Stairs             Wheelchair Mobility    Modified Rankin (Stroke Patients Only) Modified Rankin (Stroke Patients Only) Pre-Morbid Rankin Score: No symptoms Modified Rankin: Moderately severe disability     Balance Overall balance assessment: Needs assistance Sitting-balance support: Single extremity supported;No upper extremity supported Sitting balance-Leahy Scale: Good Sitting balance - Comments: good sitting balance   Standing balance support: Bilateral upper extremity supported;During functional activity Standing balance-Leahy Scale: Poor Standing balance comment: UE support/external assist                            Cognition Arousal/Alertness: Awake/alert Behavior During Therapy: WFL for tasks assessed/performed Overall Cognitive Status: Within Functional Limits for tasks assessed                                        Exercises Other Exercises Other Exercises: A/AA/PROM to all extremities within available limits    General  Comments        Pertinent Vitals/Pain Pain Assessment: No/denies pain    Home Living   Living Arrangements: (two daughter and two grand children 53 and 65 years old live w)                  Prior Function            PT Goals  (current goals can now be found in the care plan section) Acute Rehab PT Goals Patient Stated Goal: return home, read, work in the flowers PT Goal Formulation: With patient Time For Goal Achievement: 08/23/19 Potential to Achieve Goals: Fair Progress towards PT goals: Progressing toward goals    Frequency    Min 4X/week      PT Plan Current plan remains appropriate    Co-evaluation PT/OT/SLP Co-Evaluation/Treatment: Yes Reason for Co-Treatment: To address functional/ADL transfers;For patient/therapist safety PT goals addressed during session: Mobility/safety with mobility OT goals addressed during session: ADL's and self-care      AM-PAC PT "6 Clicks" Mobility   Outcome Measure  Help needed turning from your back to your side while in a flat bed without using bedrails?: A Lot Help needed moving from lying on your back to sitting on the side of a flat bed without using bedrails?: A Lot Help needed moving to and from a bed to a chair (including a wheelchair)?: A Little Help needed standing up from a chair using your arms (e.g., wheelchair or bedside chair)?: A Little Help needed to walk in hospital room?: A Lot Help needed climbing 3-5 steps with a railing? : Total 6 Click Score: 13    End of Session   Activity Tolerance: Patient tolerated treatment well Patient left: in chair;with call bell/phone within reach;with chair alarm set Nurse Communication: Mobility status;Precautions PT Visit Diagnosis: Other abnormalities of gait and mobility (R26.89);Hemiplegia and hemiparesis Hemiplegia - Right/Left: Right Hemiplegia - dominant/non-dominant: Dominant Hemiplegia - caused by: Cerebral infarction     Time: 1227-1300 PT Time Calculation (min) (ACUTE ONLY): 33 min  Charges:  $Gait Training: 8-22 mins                     08/12/2019  Donnella Sham, PT Acute Rehabilitation Services [phone]  (pager) [phone]  (office)   Jacqueline Leach 08/12/2019, 1:23  PM

## 2019-08-12 NOTE — H&P (Signed)
Physical Medicine and Rehabilitation Admission H&P    : HPI: Jacqueline Leach is a 63 year old right-handed female with history of diabetes mellitus noncompliant with medications, hypertension on no antihypertensive medications.  Per chart review she lives with her children and assistance as needed.  Reportedly independent prior to admission driving as well as independent ADLs.  She is a retired Merchandiser, retail.  1 level home 3 steps to entry.  Presented 08/09/2019 from [facility] with sudden onset of right side weakness resulting in a fall and slurred speech as well as hematoma right leg.  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  Remote lacunar infarct in the right cerebral white matter.  No hemorrhage or hydrocephalus noted.  TPA was initiated but stopped due to some hematemesis and hematoma right thigh.  Echocardiogram with ejection fraction 65% without emboli.  Carotid Dopplers unremarkable.  CT of head outside hospital showed no large vessel occlusion.  Admission chemistries with SARS coronavirus negative, hemoglobin A1c 11.8, WBC 10,900.  Neurology follow-up maintained on aspirin and Plavix for CVA prophylaxis x3 weeks then aspirin alone.  Subcutaneous Lovenox for DVT prophylaxis.  Tolerating a regular diet.  Therapy evaluations completed and patient was admitted for a comprehensive rehab program.  Review of Systems  Constitutional: Negative for chills and fever.  HENT: Negative for hearing loss.   Eyes: Negative for blurred vision and double vision.  Respiratory: Negative for cough and shortness of breath.   Cardiovascular: Positive for leg swelling. Negative for chest pain and palpitations.  Gastrointestinal: Positive for constipation. Negative for heartburn, nausea and vomiting.  Genitourinary: Negative for dysuria and flank pain.  Musculoskeletal: Positive for myalgias.       Fall resulting in a right thigh hematoma  Skin: Negative for rash.  Neurological:  Positive for speech change and focal weakness.  All other systems reviewed and are negative.  History reviewed. No pertinent past medical history. History reviewed. No pertinent surgical history. History reviewed. No pertinent family history. Social History:  reports that she has never smoked. She has never used smokeless tobacco. No history on file for alcohol and drug. Allergies:  Allergies  Allergen Reactions  . Penicillins Rash    Did it involve swelling of the face/tongue/throat, SOB, or low BP? Yes Did it involve sudden or severe rash/hives, skin peeling, or any reaction on the inside of your mouth or nose? Yes Did you need to seek medical attention at a hospital or doctor's office? Yes When did it last happen?approx 63yo If all above answers are "NO", may proceed with cephalosporin use.   . Sulfa Antibiotics Rash   Medications Prior to Admission  Medication Sig Dispense Refill  . aspirin 325 MG EC tablet Take 325 mg by mouth every 6 (six) hours as needed for pain.      Drug Regimen Review Drug regimen was reviewed and remains appropriate with no significant issues identified  Home: Home Living Family/patient expects to be discharged to:: Private residence Living Arrangements: (P) (two daughter and two grand children 48 and 25 years old live w) Available Help at Discharge: Family, Available 24 hours/day Type of Home: House Home Access: Stairs to enter [employer] of Steps: 2-3 Entrance Stairs-Rails: None Home Layout: One level Bathroom Shower/Tub: Multimedia programmer: Programmer, systems: (P) Yes Home Equipment: Shower seat - built in, [place] - single point  Lives With: Family, Daughter   Functional History: Prior Function Level of Independence: Independent Comments: Driving, IADLs, ADLs, cleaning; retired  as Merchandiser, retail; pt enjoys reading and enjoys horticulture   Functional Status:  Mobility: Bed Mobility Overal bed  mobility: Needs Assistance Bed Mobility: Rolling, Sidelying to Sit Rolling: Mod assist Sidelying to sit: Mod assist Supine to sit: Mod assist General bed mobility comments: in recliner on arrival Transfers Overall transfer level: Needs assistance Transfers: Sit to/from Stand, Stand Pivot Transfers Sit to Stand: Min assist Stand pivot transfers: Min assist Squat pivot transfers: Min assist, Mod assist General transfer comment: pt able to stand from recliner with cues for sequence and guarding for right knee stability. Sit to stand x 4 trials. Pt performed stand pivot toward Rt and LEFt for [facility] with cues for positioning and stepping with RLE with pt able to advance RLE with increased time Ambulation/Gait Ambulation/Gait assistance: Mod assist Gait Distance (Feet): 15 Feet Assistive device: Bilateral platform walker(EVA walker) Gait Pattern/deviations: Step-to pattern, Decreased step length - right, Decreased stance time - right, Decreased stride length General Gait Details: pt able to step forward and back 2' with bil UE support on therapist with face to face technique with good stability in standing for RLE. Pt then performed gait 15' with EVA.  cues to step into EVA walker. Pt with reliance on UE support on EVA with pt able to advance RLE without assist 50% of the time with mod cues for positioning and safety. Gait velocity interpretation: <1.8 ft/sec, indicate of risk for recurrent falls    ADL: ADL Overall ADL's : Needs assistance/impaired Eating/Feeding: Moderate assistance, Bed level, Cueing for safety Eating/Feeding Details (indicate cue type and reason): using LUE Grooming: Moderate assistance, Sitting, Cueing for safety Upper Body Bathing: Moderate assistance, Sitting, Cueing for safety Lower Body Bathing: Maximal assistance, Cueing for safety, Cueing for sequencing, Sitting/lateral leans, Sit to/from stand Upper Body Dressing : Moderate assistance, Sitting Lower Body Dressing:  Maximal assistance, Sitting/lateral leans, Sit to/from stand Toilet Transfer: Minimal assistance, Moderate assistance, +2 for physical assistance, +2 for safety/equipment, Stand-pivot, Squat-pivot, Cueing for sequencing, Cueing for safety, BSC Toileting- Clothing Manipulation and Hygiene: Maximal assistance, +2 for physical assistance, +2 for safety/equipment, Sitting/lateral lean, Sit to/from stand, Cueing for safety, Cueing for sequencing Functional mobility during ADLs: Minimal assistance, Moderate assistance, +2 for physical assistance, +2 for safety/equipment, Cueing for safety, Cueing for sequencing General ADL Comments: Pt limited by decreased strength, decreased mobility and decreased ability to care for self. Pt very limited on R side; but L side remains strong to perform transfers.  Cognition: Cognition Overall Cognitive Status: Within Functional Limits for tasks assessed Arousal/Alertness: Awake/alert Orientation Level: Oriented X4 Cognition Arousal/Alertness: Awake/alert Behavior During Therapy: WFL for tasks assessed/performed Overall Cognitive Status: Within Functional Limits for tasks assessed General Comments: emotional lability end of session with reality of taking steps again  Physical Exam: Blood pressure (!) 158/71, pulse 93, temperature 98.4 F (36.9 C), temperature source Oral, resp. rate 19, height 5\' 3"  (1.6 m), weight 76.6 kg, SpO2 97 %.  Physical Exam  Neurological:  Patient is alert no acute distress.  Follows commands.  Oriented x3  CN 2-12 intact with the exceptio of mild right-sided facial droop Gen: no distress, normal appearing HEENT: oral mucosa pink and moist, NCAT Cardio: Reg rate Chest: normal effort, normal rate of breathing Abd: soft, non-distended Ext: no edema Skin: intact Neuro: Musculoskeletal:  5/5 strength on left side. RUE: 2/5 EF, EE, WF RLE: 3/5 HF, KE, 0/5 DF. Psych: pleasant, normal affect. Becomes appropriately tearful when  discussing her goal to regain function in her right  arm.    Results for orders placed or performed during the hospital encounter of 08/09/19 (from the past 48 hour(s))  Glucose, capillary     Status: Abnormal   Collection Time: 08/10/19 12:24 PM  Result Value Ref Range   Glucose-Capillary 182 (H) 70 - 99 mg/dL   Comment 1 QC Due   Glucose, capillary     Status: Abnormal   Collection Time: 08/10/19  4:35 PM  Result Value Ref Range   Glucose-Capillary 243 (H) 70 - 99 mg/dL  Glucose, capillary     Status: Abnormal   Collection Time: 08/10/19 10:04 PM  Result Value Ref Range   Glucose-Capillary 189 (H) 70 - 99 mg/dL  Basic metabolic panel     Status: Abnormal   Collection Time: 08/11/19  4:10 AM  Result Value Ref Range   Sodium 138 135 - 145 mmol/L   Potassium 3.6 3.5 - 5.1 mmol/L   Chloride 105 98 - 111 mmol/L   CO2 24 22 - 32 mmol/L   Glucose, Bld 289 (H) 70 - 99 mg/dL   BUN 15 8 - 23 mg/dL   Creatinine, Ser 0.56 0.44 - 1.00 mg/dL   Calcium 8.4 (L) 8.9 - 10.3 mg/dL   GFR calc non Af Amer >60 >60 mL/min   GFR calc Af Amer >60 >60 mL/min   Anion gap 9 5 - 15    Comment: Performed at [facility], [place] [address] [place], [place] [address]  CBC     Status: Abnormal   Collection Time: 08/11/19  4:10 AM  Result Value Ref Range   WBC 10.3 4.0 - 10.5 K/uL   RBC 3.89 3.87 - 5.11 MIL/uL   Hemoglobin 11.5 (L) 12.0 - 15.0 g/dL   HCT 34.4 (L) 36.0 - 46.0 %   MCV 88.4 80.0 - 100.0 fL   MCH 29.6 26.0 - 34.0 pg   MCHC 33.4 30.0 - 36.0 g/dL   RDW 12.5 11.5 - 15.5 %   Platelets 185 150 - 400 K/uL   nRBC 0.0 0.0 - 0.2 %    Comment: Performed at [facility], [place] [address] [place], [place] [address]  Glucose, capillary     Status: Abnormal   Collection Time: 08/11/19  7:37 AM  Result Value Ref Range   Glucose-Capillary 251 (H) 70 - 99 mg/dL  Glucose, capillary     Status: Abnormal   Collection Time: 08/11/19 11:13 AM  Result Value Ref Range   Glucose-Capillary 241 (H) 70  - 99 mg/dL  Glucose, capillary     Status: Abnormal   Collection Time: 08/11/19  4:03 PM  Result Value Ref Range   Glucose-Capillary 318 (H) 70 - 99 mg/dL  Glucose, capillary     Status: Abnormal   Collection Time: 08/11/19  9:14 PM  Result Value Ref Range   Glucose-Capillary 219 (H) 70 - 99 mg/dL   Comment 1 Notify RN    Comment 2 Document in Chart   Basic metabolic panel     Status: Abnormal   Collection Time: 08/12/19  3:35 AM  Result Value Ref Range   Sodium 138 135 - 145 mmol/L   Potassium 3.4 (L) 3.5 - 5.1 mmol/L   Chloride 103 98 - 111 mmol/L   CO2 24 22 - 32 mmol/L   Glucose, Bld 218 (H) 70 - 99 mg/dL   BUN 18 8 - 23 mg/dL   Creatinine, Ser 0.50 0.44 - 1.00 mg/dL   Calcium 8.4 (  L) 8.9 - 10.3 mg/dL   GFR calc non Af Amer >60 >60 mL/min   GFR calc Af Amer >60 >60 mL/min   Anion gap 11 5 - 15    Comment: Performed at [facility] [address], [place], [place] [address]  CBC     Status: Abnormal   Collection Time: 08/12/19  3:35 AM  Result Value Ref Range   WBC 11.6 (H) 4.0 - 10.5 K/uL   RBC 3.79 (L) 3.87 - 5.11 MIL/uL   Hemoglobin 11.4 (L) 12.0 - 15.0 g/dL   HCT 32.9 (L) 36.0 - 46.0 %   MCV 86.8 80.0 - 100.0 fL   MCH 30.1 26.0 - 34.0 pg   MCHC 34.7 30.0 - 36.0 g/dL   RDW 12.3 11.5 - 15.5 %   Platelets 188 150 - 400 K/uL   nRBC 0.0 0.0 - 0.2 %    Comment: Performed at [facility], [place] [address], [place], [place] [address]  Glucose, capillary     Status: Abnormal   Collection Time: 08/12/19  8:11 AM  Result Value Ref Range   Glucose-Capillary 276 (H) 70 - 99 mg/dL   No results found.   Medical Problem List and Plan: 1.  Right side weakness and slurred speech secondary to left pontine acute perforator infarction  -patient may shower  -ELOS/Goals: modI in PT, MinA in OT, I in SLP 2.  Antithrombotics: -DVT/anticoagulation: Lovenox  -antiplatelet therapy: Aspirin and Plavix x3 weeks then aspirin alone 3. Pain Management: Tylenol as needed 4.  Mood: Provide emotional support  -antipsychotic agents: N/A 5. Neuropsych: This patient is capable of making decisions on her own behalf. 6. Skin/Wound Care: Routine skin checks 7. Fluids/Electrolytes/Nutrition: Routine in and outs with follow-up chemistries 8.  Diabetes mellitus.  Hemoglobin A1c 11.8.  Lantus insulin 12 units daily, Glucophage 500 mg twice daily.  Diabetic teaching 9.  Hyperlipidemia.  Lipitor. 10. HTN: She was on no home medications, but is currently hypertensive. Neurology recommends permissive HTN for 1 week, in 2nd week of rehab will add hypertensive agent if BP still elevated.  10. Education: Will require stroke prevention education and education regarding the importance of good control of her HTN and DM. She was noncompliant with DM medications at home and continues to be hypertensive.    Lavon Paganini Angiulli, PA-C 08/12/2019   I have personally performed a face to face diagnostic evaluation, including, but not limited to relevant history and physical exam findings, of this patient and developed relevant assessment and plan.  Additionally, I have reviewed and concur with the physician assistant's documentation above.  Leeroy Cha, MD

## 2019-08-12 NOTE — Progress Notes (Signed)
Inpatient Rehabilitation Admissions Coordinator  Inpatient rehab consult received. I met with patient at bedside for rehab assessment. We discussed goals and expectations of an inpt rehab admit. I contacted her daughter, [name] , and she is in agreement to admit to CIR today. I contacted financial counselor to begin possible disability and medicaid applications. I spoke with Dr. Erlinda Hong and he is in agreement to d/c to CIR today. I will make the arrangements.  Danne Baxter, RN, MSN Rehab Admissions Coordinator [phone] 08/12/2019 11:19 AM

## 2019-08-12 NOTE — PMR Pre-admission (Signed)
PMR Admission Coordinator Pre-Admission Assessment  Patient: Jacqueline Leach is an 63 y.o., female MRN: [id number] DOB: [birth year] Height: 5' 3" (160 cm) Weight: 76.6 kg  Insurance Information  PRIMARY: uninsured      11/30 I contacted financial counselor, Bluford Main at [phone] for disability and medicaid applications. Patient will have a spend down for Medicaid  Medicaid Application Date:       Case Manager:  Disability Application Date:       Case Worker:   The "Data Collection Information Summary" for patients in Inpatient Rehabilitation Facilities with attached "Privacy Act [place] Records" was provided and verbally reviewed with: N/A  Emergency Contact Information Contact Information    Name Relation Home Work Mobile   Lewis,[name] Daughter   [phone]   Demmi, Sindt Daughter   [phone]      Current Medical History  Patient Admitting Diagnosis: CVA  History of Present Illness:63 year old right-handed female with history of diabetes mellitus noncompliant with medications, hypertension on no antihypertensive medications. Presented 08/09/2019 from [facility] with sudden onset of right side weakness resulting in a fall and slurred speech as well as hematoma right leg.  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  Remote lacunar infarct in the right cerebral white matter.  No hemorrhage or hydrocephalus noted.  TPA was initiated but stopped due to some hematemesis and hematoma right thigh.  Echocardiogram with ejection fraction 65% without emboli.  Carotid Dopplers unremarkable.  CT of head outside hospital showed no large vessel occlusion.  Admission chemistries with SARS coronavirus negative, hemoglobin A1c 11.8, WBC 10,900.  Neurology follow-up maintained on aspirin and Plavix for CVA prophylaxis x3 weeks then aspirin alone.  Subcutaneous Lovenox for DVT prophylaxis.  Tolerating a regular diet.    Complete NIHSS TOTAL:  7  Patient's medical record from [facility]  has been reviewed by the rehabilitation admission coordinator and physician.  Past Medical History  History reviewed. No pertinent past medical history.  Family History   family history is not on file.  Prior Rehab/Hospitalizations Has the patient had prior rehab or hospitalizations prior to admission? Yes  Has the patient had major surgery during 100 days prior to admission? No   Current Medications  Current Facility-Administered Medications:  .  acetaminophen (TYLENOL) tablet 650 mg, 650 mg, Oral, Q4H PRN, 650 mg at 08/11/19 0825 **OR** acetaminophen (TYLENOL) 160 MG/5ML solution 650 mg, 650 mg, Per Tube, Q4H PRN **OR** acetaminophen (TYLENOL) suppository 650 mg, 650 mg, Rectal, Q4H PRN, Aroor, Karena Addison R, MD .  aspirin chewable tablet 81 mg, 81 mg, Oral, Daily, Garvin Fila, MD, 81 mg at 08/12/19 1011 .  atorvastatin (LIPITOR) tablet 80 mg, 80 mg, Oral, q1800, Rinehuls, David L, PA-C, 80 mg at 08/11/19 1724 .  bisacodyl (DULCOLAX) EC tablet 5 mg, 5 mg, Oral, Daily PRN, Rosalin Hawking, MD .  bisacodyl (DULCOLAX) suppository 10 mg, 10 mg, Rectal, Once, Rosalin Hawking, MD .  Chlorhexidine Gluconate Cloth 2 % PADS 6 each, 6 each, Topical, Daily, Aroor, Lanice Schwab, MD, 6 each at 08/11/19 1730 .  clopidogrel (PLAVIX) tablet 75 mg, 75 mg, Oral, Daily, Garvin Fila, MD, 75 mg at 08/12/19 1011 .  enoxaparin (LOVENOX) injection 40 mg, 40 mg, Subcutaneous, Q24H, Rosalin Hawking, MD, 40 mg at 08/12/19 1012 .  insulin aspart (novoLOG) injection 0-15 Units, 0-15 Units, Subcutaneous, TID WC, Rosalin Hawking, MD, 8 Units at 08/12/19 [phone] .  insulin aspart (novoLOG) injection 0-5 Units, 0-5 Units, Subcutaneous, QHS, Rosalin Hawking, MD,  2 Units at 08/11/19 2152 .  insulin glargine (LANTUS) injection 12 Units, 12 Units, Subcutaneous, Daily, Rinehuls, David L, PA-C, 12 Units at 08/12/19 1012 .  metFORMIN (GLUCOPHAGE) tablet 500 mg, 500 mg, Oral, BID WC, Rosalin Hawking, MD, 500 mg at 08/12/19 0841 .  pantoprazole (PROTONIX) EC tablet 40 mg, 40 mg, Oral, Daily, Rosalin Hawking, MD, 40 mg at 08/12/19 1011  Patients Current Diet:  Diet Order            Diet heart healthy/carb modified Room service appropriate? Yes with Assist; Fluid consistency: Thin  Diet effective now              Precautions / Restrictions Precautions Precautions: Fall Precaution Comments: right hemiparesis Restrictions Weight Bearing Restrictions: No   Has the patient had 2 or more falls or a fall with injury in the past year? Yes  Prior Activity Level Community (5-7x/wk): independent and driving; retired  Prior Functional Level Self Care: Did the patient need help bathing, dressing, using the toilet or eating? Independent  Indoor Mobility: Did the patient need assistance with walking from room to room (with or without device)? Independent  Stairs: Did the patient need assistance with internal or external stairs (with or without device)? Independent  Functional Cognition: Did the patient need help planning regular tasks such as shopping or remembering to take medications? Independent  Home Equities trader / Equipment Home Assistive Devices/Equipment: None Home Equipment: Shower seat - built in, [place] - single point  Prior Device Use: Indicate devices/aids used by the patient prior to current illness, exacerbation or injury? None of the above  Current Functional Level Cognition  Arousal/Alertness: Awake/alert Overall Cognitive Status: Within Functional Limits for tasks assessed Orientation Level: Oriented X4 General Comments: emotional lability end of session with reality of taking steps again    Extremity Assessment (includes Sensation/Coordination)  Upper Extremity Assessment: Generalized weakness, RUE deficits/detail RUE Deficits / Details: 1/5 MM grade for shoulder elevation minimal and minimal shoulder retraction RUE Coordination: decreased fine motor,  decreased gross motor  Lower Extremity Assessment: Defer to PT evaluation, Generalized weakness RLE Deficits / Details: hip flexion 1/5, knee extension 2/5, knee flexion 2/5, dorsiflexion 1/5, no toe movement    ADLs  Overall ADL's : Needs assistance/impaired Eating/Feeding: Moderate assistance, Bed level, Cueing for safety Eating/Feeding Details (indicate cue type and reason): using LUE Grooming: Moderate assistance, Sitting, Cueing for safety Upper Body Bathing: Moderate assistance, Sitting, Cueing for safety Lower Body Bathing: Maximal assistance, Cueing for safety, Cueing for sequencing, Sitting/lateral leans, Sit to/from stand Upper Body Dressing : Moderate assistance, Sitting Lower Body Dressing: Maximal assistance, Sitting/lateral leans, Sit to/from stand Toilet Transfer: Minimal assistance, Moderate assistance, +2 for physical assistance, +2 for safety/equipment, Stand-pivot, Squat-pivot, Cueing for sequencing, Cueing for safety, BSC Toileting- Clothing Manipulation and Hygiene: Maximal assistance, +2 for physical assistance, +2 for safety/equipment, Sitting/lateral lean, Sit to/from stand, Cueing for safety, Cueing for sequencing Functional mobility during ADLs: Minimal assistance, Moderate assistance, +2 for physical assistance, +2 for safety/equipment, Cueing for safety, Cueing for sequencing General ADL Comments: Pt limited by decreased strength, decreased mobility and decreased ability to care for self. Pt very limited on R side; but L side remains strong to perform transfers.    Mobility  Overal bed mobility: Needs Assistance Bed Mobility: Rolling, Sidelying to Sit Rolling: Mod assist Sidelying to sit: Mod assist Supine to sit: Mod assist General bed mobility comments: in recliner on arrival    Transfers  Overall transfer level: Needs assistance  Transfers: Sit to/from Stand, Risk manager Sit to Stand: Min assist Stand pivot transfers: Min assist Squat pivot  transfers: Min assist, Mod assist General transfer comment: pt able to stand from recliner with cues for sequence and guarding for right knee stability. Sit to stand x 4 trials. Pt performed stand pivot toward Rt and LEFt for [facility] with cues for positioning and stepping with RLE with pt able to advance RLE with increased time    Ambulation / Gait / Stairs / Wheelchair Mobility  Ambulation/Gait Ambulation/Gait assistance: Mod assist Gait Distance (Feet): 15 Feet Assistive device: Bilateral platform walker(EVA walker) Gait Pattern/deviations: Step-to pattern, Decreased step length - right, Decreased stance time - right, Decreased stride length General Gait Details: pt able to step forward and back 2' with bil UE support on therapist with face to face technique with good stability in standing for RLE. Pt then performed gait 15' with EVA.  cues to step into EVA walker. Pt with reliance on UE support on EVA with pt able to advance RLE without assist 50% of the time with mod cues for positioning and safety. Gait velocity interpretation: <1.8 ft/sec, indicate of risk for recurrent falls    Posture / Balance Dynamic Sitting Balance Sitting balance - Comments: good sitting balance Balance Overall balance assessment: Needs assistance Sitting-balance support: Single extremity supported, No upper extremity supported Sitting balance-Leahy Scale: Good Sitting balance - Comments: good sitting balance Standing balance support: Bilateral upper extremity supported, Single extremity supported Standing balance-Leahy Scale: Poor Standing balance comment: reliance on LUE assist for stability with weak RLE    Special needs/care consideration BiPAP/CPAP  CPM  Continuous Drip IV  Dialysis         Days  Life Vest  Oxygen  Special Bed  Trach Size  Wound Vac (area)       Location  Skin Right hip hematoma, ecchymosis                    Bowel mgmt:  LBM 11/26 continent Bladder mgmt: continent Diabetic mgmt: Hb A1c  11.8 Behavioral consideration emotionally labile Chemo/radiation  Designated visitor is daughter, [name]   Previous [place]: (two daughter and two grand children 66 and 72 years old live w)  Lives With: Family, Daughter Available Help at Discharge: Family, Available 24 hours/day Type of Home: House Home Layout: One level Home Access: Stairs to enter Entrance Stairs-Rails: None Technical brewer of Steps: 2-3 Bathroom Shower/Tub: Multimedia programmer: Standard Bathroom Accessibility: Yes How Accessible: Accessible via walker Home Care Services: No  Discharge Living Setting Plans for Discharge Living Setting: Patient's home(2 daughters and 2 grand children live with her) Type of Home at Discharge: House Discharge Home Layout: One level Discharge Home Access: Stairs to enter Entrance Stairs-Rails: None Entrance Stairs-Number of Steps: 2 to 3 Discharge Bathroom Shower/Tub: Walk-in shower Discharge Bathroom Toilet: Standard Discharge Bathroom Accessibility: Yes How Accessible: Accessible via walker Does the patient have any problems obtaining your medications?: Yes (Describe)(uninsured)  Social/Family/Support Systems Patient Roles: Parent Contact Information: [name], is main contact; [name] is visitor  Anticipated Caregiver: [name]/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 so not visiting at this ime Anticipated Caregiver's Contact Information: [phone] Ability/Limitations of Caregiver: [name] there 24/7; [name] works first shift Careers adviser: 24/7 Discharge Plan Discussed with Primary Caregiver: Yes Is Caregiver In Agreement with Plan?: Yes Does Caregiver/Family have Issues with Lodging/Transportation while Pt is in Rehab?: No  Goals/Additional Needs Patient/Family Goal for Rehab: supervision PT, supervision to  Min OT Expected length of stay: ELOS 10 to 14 days Pt/Family Agrees to Admission and willing to participate:  Yes Program Orientation Provided & Reviewed with Pt/Caregiver Including Roles  & Responsibilities: Yes  Decrease burden of Care through IP rehab admission: n/a  Possible need for SNF placement upon discharge: n/a  Patient Condition: I have reviewed medical records from [facility] , spoken with patient and daughter. I met with patient at the bedside for inpatient rehabilitation assessment.  Patient will benefit from ongoing PT and OT, can actively participate in 3 hours of therapy a day 5 days of the week, and can make measurable gains during the admission.  Patient will also benefit from the coordinated team approach during an Inpatient Acute Rehabilitation admission.  The patient will receive intensive therapy as well as Rehabilitation physician, nursing, social worker, and care management interventions.  Due to bladder management, bowel management, safety, skin/wound care, disease management, medication administration, pain management and patient education the patient requires 24 hour a day rehabilitation nursing.  The patient is currently mod assist with mobility and basic ADLs.  Discharge setting and therapy post discharge at home with home health is anticipated.  Patient has agreed to participate in the Acute Inpatient Rehabilitation Program and will admit today.  Preadmission Screen Completed By:  Cleatrice Burke, 08/12/2019 11:40 AM ______________________________________________________________________   Discussed status with Dr. Ranell Patrick on  08/12/2019 at  1145 and received approval for admission today.  Admission Coordinator:  Cleatrice Burke, RN, time  8338 Date  08/12/2019   Assessment/Plan: Diagnosis: Right sided weakness and slurred speech secondary to left pontine acute perforator infarction 1. Does the need for close, 24 hr/day Medical supervision in concert with the patient's rehab needs make it unreasonable for this patient to be served in a less intensive  setting? Yes 2. Co-Morbidities requiring supervision/potential complications: Type 2 DM, HTN 3. Due to safety, disease management, medication administration and patient education, does the patient require 24 hr/day rehab nursing? Yes 4. Does the patient require coordinated care of a physician, rehab nurse, PT and OT to address physical and functional deficits in the context of the above medical diagnosis(es)? Yes Addressing deficits in the following areas: balance, endurance, locomotion, strength, transferring, bathing, dressing, feeding, grooming, toileting and psychosocial support 5. Can the patient actively participate in an intensive therapy program of at least 3 hrs of therapy 5 days a week? Yes 6. The potential for patient to make measurable gains while on inpatient rehab is excellent 7. Anticipated functional outcomes upon discharge from inpatient rehab: modified independent PT, min assist OT, independent SLP 8. Estimated rehab length of stay to reach the above functional goals is: 10-14 days 9. Anticipated discharge destination: Home 10. Overall Rehab/Functional Prognosis: excellent   MD Signature: Leeroy Cha, MD

## 2019-08-12 NOTE — Plan of Care (Signed)
Pt being discharged to inpatient rehab. Pt's VS WNL, A/O x4, in no distress.

## 2019-08-13 ENCOUNTER — Inpatient Hospital Stay (HOSPITAL_COMMUNITY): Payer: Self-pay | Admitting: Physical Therapy

## 2019-08-13 ENCOUNTER — Inpatient Hospital Stay (HOSPITAL_COMMUNITY): Payer: Self-pay | Admitting: Occupational Therapy

## 2019-08-13 ENCOUNTER — Inpatient Hospital Stay (HOSPITAL_COMMUNITY): Payer: Self-pay

## 2019-08-13 ENCOUNTER — Encounter (HOSPITAL_COMMUNITY): Payer: Self-pay

## 2019-08-13 DIAGNOSIS — I6350 Cerebral infarction due to unspecified occlusion or stenosis of unspecified cerebral artery: Secondary | ICD-10-CM

## 2019-08-13 LAB — CBC WITH DIFFERENTIAL/PLATELET
Abs Immature Granulocytes: 0.05 10*3/uL (ref 0.00–0.07)
Basophils Absolute: 0.1 10*3/uL (ref 0.0–0.1)
Basophils Relative: 1 %
Eosinophils Absolute: 0.2 10*3/uL (ref 0.0–0.5)
Eosinophils Relative: 2 %
HCT: 35.3 % — ABNORMAL LOW (ref 36.0–46.0)
Hemoglobin: 11.9 g/dL — ABNORMAL LOW (ref 12.0–15.0)
Immature Granulocytes: 1 %
Lymphocytes Relative: 31 %
Lymphs Abs: 3.3 10*3/uL (ref 0.7–4.0)
MCH: 29.5 pg (ref 26.0–34.0)
MCHC: 33.7 g/dL (ref 30.0–36.0)
MCV: 87.6 fL (ref 80.0–100.0)
Monocytes Absolute: 0.9 10*3/uL (ref 0.1–1.0)
Monocytes Relative: 8 %
Neutro Abs: 6.3 10*3/uL (ref 1.7–7.7)
Neutrophils Relative %: 57 %
Platelets: 211 10*3/uL (ref 150–400)
RBC: 4.03 MIL/uL (ref 3.87–5.11)
RDW: 12.5 % (ref 11.5–15.5)
WBC: 10.7 10*3/uL — ABNORMAL HIGH (ref 4.0–10.5)
nRBC: 0 % (ref 0.0–0.2)

## 2019-08-13 LAB — COMPREHENSIVE METABOLIC PANEL
ALT: 20 U/L (ref 0–44)
AST: 20 U/L (ref 15–41)
Albumin: 2.9 g/dL — ABNORMAL LOW (ref 3.5–5.0)
Alkaline Phosphatase: 66 U/L (ref 38–126)
Anion gap: 14 (ref 5–15)
BUN: 16 mg/dL (ref 8–23)
CO2: 23 mmol/L (ref 22–32)
Calcium: 9.2 mg/dL (ref 8.9–10.3)
Chloride: 102 mmol/L (ref 98–111)
Creatinine, Ser: 0.53 mg/dL (ref 0.44–1.00)
GFR calc Af Amer: >60 mL/min (ref >60)
GFR calc non Af Amer: >60 mL/min (ref >60)
Glucose, Bld: 214 mg/dL — ABNORMAL HIGH (ref 70–99)
Potassium: 3.7 mmol/L (ref 3.5–5.1)
Sodium: 139 mmol/L (ref 135–145)
Total Bilirubin: 1 mg/dL (ref 0.3–1.2)
Total Protein: 5.7 g/dL — ABNORMAL LOW (ref 6.5–8.1)

## 2019-08-13 LAB — GLUCOSE, CAPILLARY
Glucose-Capillary: 215 mg/dL — ABNORMAL HIGH (ref 70–99)
Glucose-Capillary: 194 mg/dL — ABNORMAL HIGH (ref 70–99)
Glucose-Capillary: 160 mg/dL — ABNORMAL HIGH (ref 70–99)
Glucose-Capillary: 161 mg/dL — ABNORMAL HIGH (ref 70–99)

## 2019-08-13 IMAGING — DX DG ABD PORTABLE 1V
1 series · 1 of 1 positions shown · non-contrast
Comparison: None.

CLINICAL DATA: Nausea and vomiting today.

EXAM:
PORTABLE ABDOMEN - 1 VIEW

[abdomen kub]
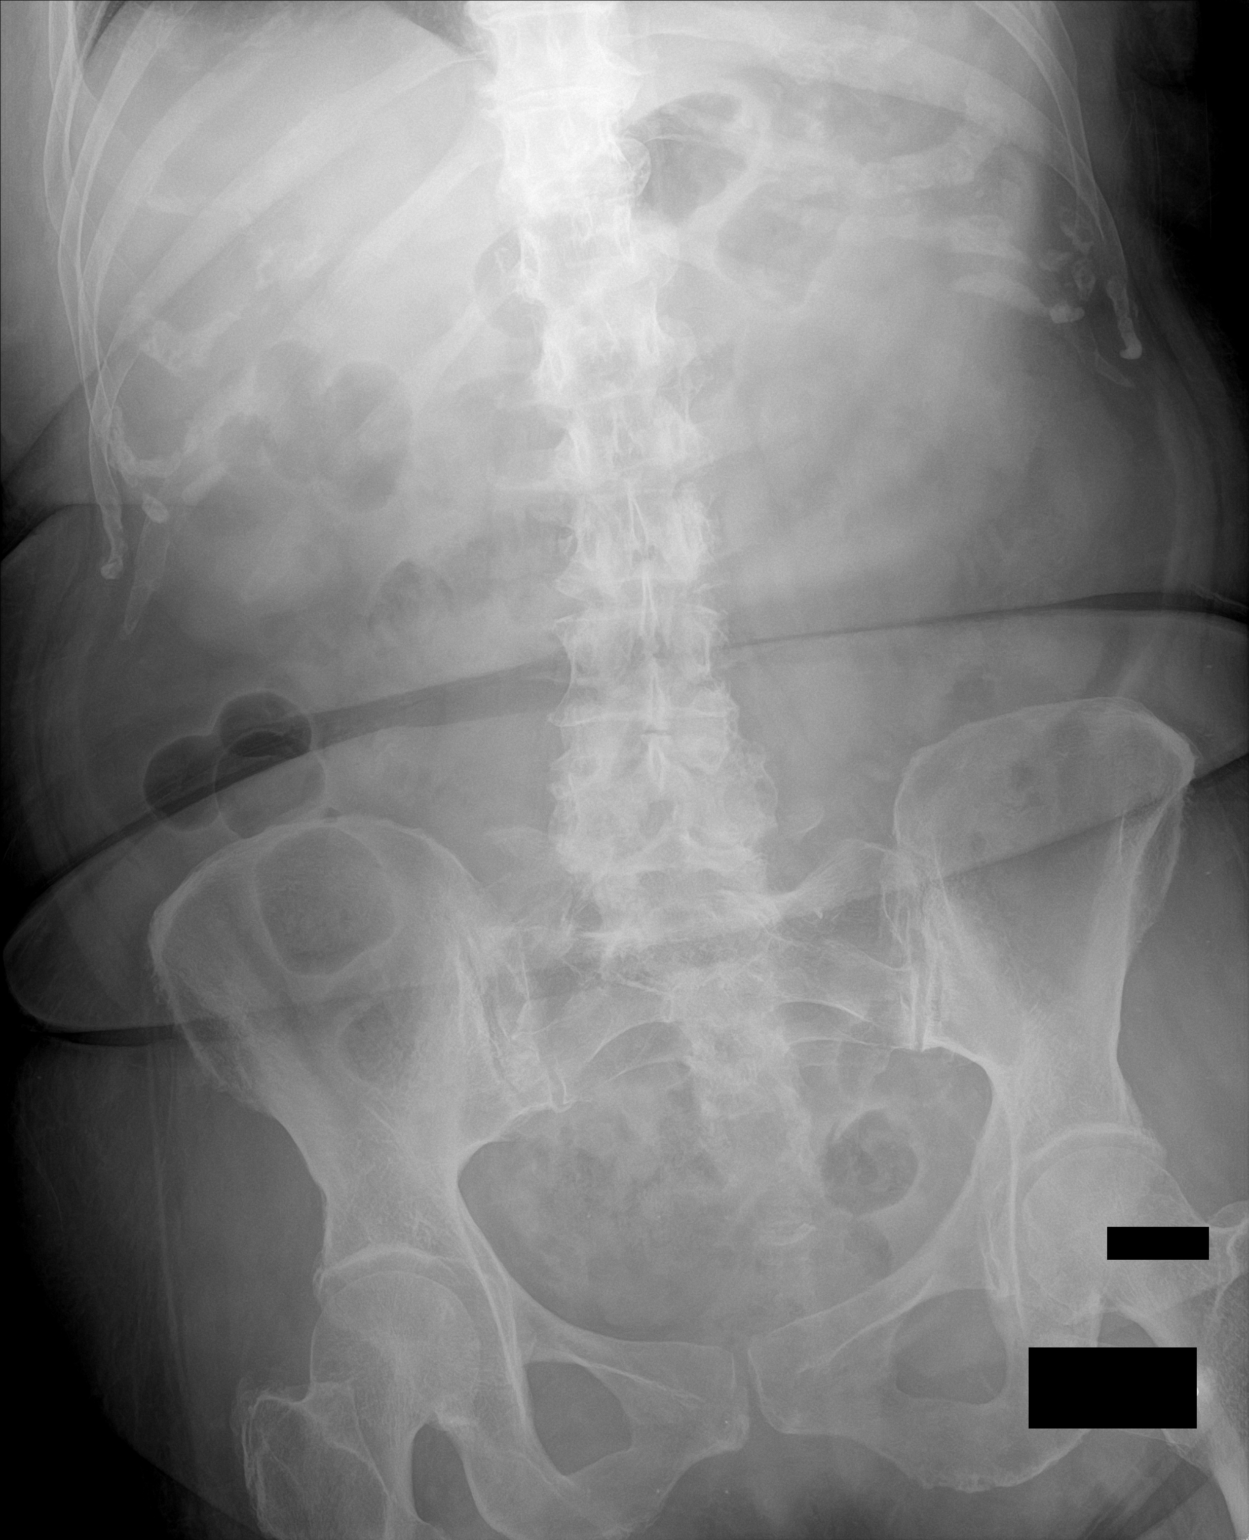

[1 of 1 positions shown; findings below may reference images not displayed]

FINDINGS: The abdominal bowel gas pattern is unremarkable. There is scattered
air in the small bowel and colon but no findings to suggest
obstruction or perforation. The soft tissue shadows are maintained.
The bony structures are intact.
IMPRESSION: No plain film findings for an acute abdominal process.

## 2019-08-13 MED ORDER — ONDANSETRON HCL 4 MG PO TABS: 4.0000 mg | ORAL_TABLET | Freq: Once | ORAL | Status: DC

## 2019-08-13 MED ORDER — ONDANSETRON HCL 4 MG PO TABS: 4.0000 mg | ORAL_TABLET | Freq: Four times a day (QID) | ORAL | Status: DC | PRN

## 2019-08-13 MED ORDER — SODIUM CHLORIDE 0.9 % IV BOLUS
250.0000 mL | Freq: Once | INTRAVENOUS | Status: AC
  Administered 2019-08-13: 250 mL via INTRAVENOUS

## 2019-08-13 MED ORDER — INSULIN GLARGINE 100 UNIT/ML ~~LOC~~ SOLN
15.0000 [IU] | Freq: Every day | SUBCUTANEOUS | Status: DC
  Administered 2019-08-13 – 2019-08-17 (×5): 15 [IU] via SUBCUTANEOUS
  Filled 2019-08-13 (×5): qty 0.15

## 2019-08-13 MED ORDER — ONDANSETRON HCL 4 MG/2ML IJ SOLN
4.0000 mg | Freq: Four times a day (QID) | INTRAMUSCULAR | Status: DC | PRN
  Administered 2019-08-13: 06:00:00 4 mg via INTRAMUSCULAR
  Filled 2019-08-13: qty 2

## 2019-08-13 NOTE — Progress Notes (Signed)
Social Work Assessment and Plan   Patient Details  Name: Jacqueline Leach MRN: [id number] Date of Birth: 1956-02-12  Today's Date: 08/13/2019  Problem List:  Patient Active Problem List   Diagnosis Date Noted  . Fall at home, initial encounter 08/12/2019  . Hematoma of right thigh 08/12/2019  . Hematemesis 08/12/2019  . Essential hypertension 08/12/2019  . Hyperlipidemia 08/12/2019  . Type II diabetes mellitus, uncontrolled ([place]) 08/12/2019  . Obesity 08/12/2019  . Noncompliance with medication regimen 08/12/2019  . Left pontine cerebrovascular accident ([place]) 08/12/2019  . Ischemic stroke ([place]) - L pontine d/t small vessel dz s/p IV tPA 08/09/2019   Past Medical History: History reviewed. No pertinent past medical history. Past Surgical History: History reviewed. No pertinent surgical history. Social History:  reports that she has never smoked. She has never used smokeless tobacco. She reports previous alcohol use. She reports that she does not use drugs.  Family / Support Systems Marital Status: Widow/Widower Patient Roles: Parent, Caregiver Children: [name]-daughter [phone]-home  [name]-daughter [phone]-home Other Supports: Friends and church members Anticipated Caregiver: margaret has a autoimmune disorder and will not visit her other daughter [name] will be the visitor while here Ability/Limitations of Caregiver: [name] can provide assist-[name] works first shift Caregiver Availability: 24/7 Family Dynamics: Close knit family all get along and help one another. Pt reports there are times it is tense but like that in all families. All help with the grandchildren. 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   Abuse/Neglect Abuse/Neglect Assessment Can Be Completed: Yes Physical Abuse: Denies Verbal Abuse: Denies Sexual Abuse: Denies Exploitation of patient/patient's resources: Denies Self-Neglect: Denies  Emotional Status Pt's affect, behavior and adjustment status: Pt is motivated to regain her independence and not require assist at discharge. She has always been independent and taken care of others she is not used to this and really doesn't like it. Recent Psychosocial Issues: Has not gone to the MD like she should but thought she was healthy Psychiatric History: No history deferred depression screen due to adjusting to the new unit and not feeling well today. Do feel she would benefit from seeing neuro-psych while here. Will await therapy evaluations and get input from them also. Substance Abuse History: No issues  Patient / Family Perceptions, Expectations & Goals Pt/Family understanding of illness & functional limitations: pt and daughter are able to explain her stroke and deficits. It was surprise to her and she realizes she needs to go to the MD regularly and take medication. Both feel they have a good understandng of her treatment plan going forward and are hopeful she will do well here. Premorbid pt/family roles/activities: Mom, retiree, widower, grandmother, church member, friend, etc Anticipated changes in roles/activities/participation: resume Pt/family expectations/goals: Pt states: " I hope to do well here and will do my best to do this."  Daughter states: " We are hopeful but will do what ever is needed for her, since she would for [place]."  [employer]: None Premorbid Home Care/DME Agencies: None Transportation available at discharge: Pt drove prior to admission but now will need to rely upon her daughter's Resource referrals recommended: Neuropsychology, Support group (specify)  Discharge  Planning Living Arrangements: Children, Other relatives Support  Systems: Children, Other relatives, Friends/neighbors, Social worker community Type of Residence: Private residence Insurance Resources: Self-pay(Applying for [employer]) Financial Resources: Social Security, Family Support Financial Screen Referred: Yes Living Expenses: Own Money Management: Patient Does the patient have any problems obtaining your medications?: Yes (Describe)(uninsured-did not go to the MD) Home Management: Both she and daughter's Patient/Family Preliminary Plans: Return home with daughter's and grandchildren. [name] does not work and is there 24 hr she also cares for her children-60yo & 22 yo. Her other daughter-[name] works first shift and will be home in the evenings. She will have 24 hr care at discharge. Sw Barriers to Discharge: Other (comments) Sw Barriers to Discharge Comments: Unisured was not seeing a MD prior to admission Social Work Anticipated Follow Up Needs: HH/OP, Support Group  Clinical Impression Pleasant nauseated female who is willing to work and push through to get better. She has always been independent and the caregiver for others she is not sued to this role. Her two daughter's are involved and live with her and will assist at discharge. Will await therapy evaluations and work on discharge needs. Do feel pt would benefit from seeing neuro-psych while here. Will have apply for Medicaid and see if can obtain PCP prior to discharge home  Levin Dagostino, Gardiner Rhyme 08/13/2019, 9:15 AM

## 2019-08-13 NOTE — Evaluation (Signed)
Occupational Therapy Assessment and Plan  Patient Details  Name: Jacqueline Leach MRN: [id number] Date of Birth: [birth year]  OT Diagnosis: abnormal posture, hemiplegia affecting dominant side, muscle weakness (generalized) and coordination disorder Rehab Potential: Rehab Potential (ACUTE ONLY): Good ELOS: 14-17 days   Today's Date: 08/13/2019 OT Individual Time: 0945-1100 OT Individual Time Calculation (min): 75 min     Problem List:  Patient Active Problem List   Diagnosis Date Noted  . Fall at home, initial encounter 08/12/2019  . Hematoma of right thigh 08/12/2019  . Hematemesis 08/12/2019  . Essential hypertension 08/12/2019  . Hyperlipidemia 08/12/2019  . Type II diabetes mellitus, uncontrolled ([place]) 08/12/2019  . Obesity 08/12/2019  . Noncompliance with medication regimen 08/12/2019  . 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 Ischemic stroke ([place]) - L pontine d/t small vessel dz s/p IV tPA 08/09/2019    Past Medical History: History reviewed. No pertinent past medical history. Past Surgical History: History reviewed. No pertinent surgical history.  Assessment & Plan Clinical Impression: Patient is a 63 y.o. year old female with history of diabetes mellitus noncompliant with medications, hypertension on no antihypertensive medications.  Per chart review she lives with her children and assistance as needed.  Reportedly independent prior to admission driving as well as independent ADLs.  She is a retired Merchandiser, retail.  1 level home 3 steps to entry.  Presented 08/09/2019 [facility] with sudden onset of right side weakness resulting in a fall and slurred speech as well as hematoma right leg.  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  Remote lacunar infarct in the right cerebral white matter.  No hemorrhage or hydrocephalus noted.  TPA was initiated but stopped due to some hematemesis and hematoma right thigh.  Echocardiogram with  ejection fraction 65% without emboli.  Carotid Dopplers unremarkable.  CT of head outside hospital showed no large vessel occlusion.  Admission chemistries with SARS coronavirus negative, hemoglobin A1c 11.8, WBC 10,900.  Neurology follow-up maintained on aspirin and Plavix for CVA prophylaxis x3 weeks then aspirin alone. .  Patient transferred to CIR on 08/12/2019 .    Patient currently requires max with basic self-care skills secondary to muscle weakness, decreased cardiorespiratoy endurance, decreased coordination and decreased motor planning, decreased problem solving, decreased safety awareness and delayed processing and decreased sitting balance, decreased standing balance, hemiplegia and difficulty maintaining precautions.  Prior to hospitalization, patient could complete ADLs and IADLs with independent .  Patient will benefit from skilled intervention to decrease level of assist with basic self-care skills prior to discharge home with care partner.  Anticipate patient will require 24 hour supervision and follow up home health.  OT - End of Session Activity Tolerance: Decreased this session Endurance Deficit: Yes Endurance Deficit Description: 2/2 generalized deconditioning OT Assessment Rehab Potential (ACUTE ONLY): Good OT Barriers to Discharge: (none known at this time) OT Patient demonstrates impairments in the following area(s): Balance;Endurance;Motor;Safety OT Basic ADL's Functional Problem(s): Grooming;Bathing;Dressing;Toileting OT Advanced ADL's Functional Problem(s): (none) OT Transfers Functional Problem(s): Toilet;Tub/Shower OT Additional Impairment(s): Fuctional Use of Upper Extremity OT Plan OT Intensity: Minimum of 1-2 x/day, 45 to 90 minutes OT Frequency: 5 out of 7 days OT Duration/Estimated Length of Stay: 14-17 days OT Treatment/Interventions: Balance/vestibular training;Functional electrical stimulation;Self Care/advanced ADL retraining;UE/LE Coordination  activities;Cognitive remediation/compensation;Functional mobility training;Skin care/wound managment;Community reintegration;Neuromuscular re-education;Wheelchair propulsion/positioning;Discharge planning;Pain management;Therapeutic Activities;Patient/family education;Therapeutic Exercise;DME/adaptive equipment instruction;Psychosocial support;UE/LE Strength taining/ROM OT Self Feeding Anticipated Outcome(s): n/a OT Basic Self-Care Anticipated Outcome(s): S OT Toileting Anticipated Outcome(s): S  OT Bathroom Transfers Anticipated Outcome(s): S OT Recommendation Recommendations for Other Services: Neuropsych consult;Therapeutic Recreation consult Therapeutic Recreation Interventions: [place] group Patient destination: Home Follow Up Recommendations: Home health OT;24 hour supervision/assistance Equipment Recommended: To be determined   Skilled Therapeutic Intervention Upon entering the room, pt seated in wheelchair with no c/o pain and agreeable to OT intervention. Pt declined toileting this session and reported having went prior to OT arrival. Pt engaged in bathing tasks with sit <>stand from sink with mod A. Pt needing hand over hand assistance to utilize R UE in functional tasks and focus on hemiplegic dressing technique this session. OT educated pt on OT purpose, POC, and goals with pt verbalizing understanding and agreement. Pt remained in chair with chair alarm activated and call bell within reach.   OT Evaluation Precautions/Restrictions  Precautions Precautions: Fall Precaution Comments: R hemi General   Vital Signs Therapy Vitals Temp: 98.2 F (36.8 C) Pulse Rate: 81 Resp: 18 BP: 128/62 Patient Position (if appropriate): Sitting Oxygen Therapy SpO2: 99 % O2 Device: Room Air Pain Pain Assessment Pain Scale: 0-10 Pain Score: 0-No pain Home Living/Prior Functioning Home Living Family/patient expects to be discharged to:: Private residence Living Arrangements: Children,  Other relatives Available Help at Discharge: Family, Available 24 hours/day Type of Home: House Home Access: Stairs to enter Technical brewer of Steps: 3 Entrance Stairs-Rails: None Home Layout: One level Bathroom Shower/Tub: Multimedia programmer: Programmer, systems: Yes  Lives With: Family, Daughter Prior Function Level of Independence: Independent with basic ADLs, Independent with transfers, Independent with gait, Independent with homemaking with ambulation(Simultaneous filing. User may not have seen previous data.)  Able to Take Stairs?: Yes Driving: Yes Vocation: Retired(Simultaneous filing. User may not have seen previous data.) Leisure: Hobbies-yes (Comment) Comments: kept 7 y/o grandchild 2 days/week(Simultaneous filing. User may not have seen previous data.) Vision Baseline Vision/History: No visual deficits Patient Visual Report: No change from baseline Vision Assessment?: Yes Cognition Overall Cognitive Status: Within Functional Limits for tasks assessed Arousal/Alertness: Awake/alert Orientation Level: Person;Place;Situation Person: Oriented Place: Oriented Situation: Oriented Year: 2020 Month: December Day of Week: Correct Memory: Appears intact Immediate Memory Recall: Sock;Blue;Bed Memory Recall Sock: Without Cue Memory Recall Blue: Without Cue Memory Recall Bed: Without Cue Awareness: Appears intact Problem Solving: Appears intact Safety/Judgment: Appears intact Sensation Sensation Light Touch: Appears Intact Proprioception: Appears Intact Coordination Gross Motor Movements are Fluid and Coordinated: No Fine Motor Movements are Fluid and Coordinated: No Coordination and Movement Description: impaired RUE & RLE 2/2 hemiparesis Motor  Motor Motor: Abnormal postural alignment and control Motor - Skilled Clinical Observations: generalized deconditioning, R hemiparesis Mobility  Bed Mobility Bed Mobility: Supine to  Sit Supine to Sit: Maximal Assistance - Patient - Patient 25-49% Transfers Sit to Stand: Minimal Assistance - Patient > 75% Stand to Sit: Minimal Assistance - Patient > 75%  Trunk/Postural Assessment  Cervical Assessment Cervical Assessment: Within Functional Limits Thoracic Assessment Thoracic Assessment: Exceptions to WFL(rounded shoulders) Postural Control Postural Control: Deficits on evaluation Righting Reactions: delayed Protective Responses: delayed  Balance Balance Balance Assessed: Yes Static Sitting Balance Static Sitting - Balance Support: Feet supported;Left upper extremity supported Static Sitting - Level of Assistance: 5: Stand by assistance Dynamic Standing Balance Dynamic Standing - Balance Support: Left upper extremity supported;During functional activity Dynamic Standing - Level of Assistance: 3: Mod assist Extremity/Trunk Assessment RUE Assessment RUE Assessment: Exceptions to [facility] Passive Range of Motion (PROM) Comments: WFLs General Strength Comments: 2-/5 throughout proximal to distal LUE Assessment LUE Assessment: Within  Functional Limits     Refer to Care Plan for Long Term Goals  Recommendations for other services: Neuropsych and Therapeutic Recreation  Kitchen group and Stress management   Discharge Criteria: Patient will be discharged from OT if patient refuses treatment 3 consecutive times without medical reason, if treatment goals not met, if there is a change in medical status, if patient makes no progress towards goals or if patient is discharged from hospital.  The above assessment, treatment plan, treatment alternatives and goals were discussed and mutually agreed upon: by patient  Gypsy Decant 08/13/2019, 4:37 PM

## 2019-08-13 NOTE — Progress Notes (Signed)
[facility] PHYSICAL MEDICINE & REHABILITATION PROGRESS NOTE   Subjective/Complaints:  Per OT and RN was nauseated today , vomiting and diarrhea, no abd pain  DId not take any meds at home PTA  ROS- denies CP, SOB, No joint pain no back pain    Objective:   No results found. Recent Labs    08/12/19 0335 08/13/19 0437  WBC 11.6* 10.7*  HGB 11.4* 11.9*  HCT 32.9* 35.3*  PLT 188 211   Recent Labs    08/12/19 0335 08/13/19 0437  NA 138 139  K 3.4* 3.7  CL 103 102  CO2 24 23  GLUCOSE 218* 214*  BUN 18 16  CREATININE 0.50 0.53  CALCIUM 8.4* 9.2   No intake or output data in the 24 hours ending 08/13/19 [id number]   Physical Exam: Vital Signs Blood pressure (!) 156/74, pulse 78, temperature 98.1 F (36.7 C), temperature source Oral, resp. rate 15, height 5\' 3"  (1.6 m), weight 75.7 kg, SpO2 96 %.   General: No acute distress Mood and affect are appropriate Heart: Regular rate and rhythm no rubs murmurs or extra sounds Lungs: Clear to auscultation, breathing unlabored, no rales or wheezes Abdomen: Positive bowel sounds, soft nontender to palpation, nondistended Extremities: No clubbing, cyanosis, or edema Skin: No evidence of breakdown, no evidence of rash Neurologic: Cranial nerves II through XII intact, motor strength is 5/5 in LEFT  deltoid, bicep, tricep, grip, hip flexor, knee extensors, ankle dorsiflexor and plantar flexor 2- RIght biceps and delt, 3- Right Knee ext , 0/5 in Rihgt ankle DF/PF and HF  Sensory exam normal sensation to light touch and proprioception in bilateral upper and lower extremities  Musculoskeletal: Full range of motion in all 4 extremities. No joint swelling   Assessment/Plan: 1. 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  Physiatrist is providing close team supervision and 24 hour management of active medical problems listed  below.  Physiatrist and rehab team continue to assess barriers to discharge/monitor patient progress toward functional and medical goals  Care Tool:  Bathing              Bathing assist       Upper Body Dressing/Undressing Upper body dressing   What is the patient wearing?: Hospital gown only    Upper body assist Assist Level: Minimal Assistance - Patient > 75%    Lower Body Dressing/Undressing Lower body dressing      What is the patient wearing?: Underwear/pull up     Lower body assist Assist for lower body dressing: Moderate Assistance - Patient 50 - 74%     Toileting Toileting    Toileting assist Assist for toileting: Moderate Assistance - Patient 50 - 74%     Transfers Chair/bed transfer  Transfers assist     Chair/bed transfer assist level: Moderate Assistance - Patient 50 - 74%     Locomotion Ambulation   Ambulation assist              Walk 10 feet activity   Assist           Walk 50 feet activity   Assist           Walk 150 feet activity   Assist           Walk 10 feet on uneven surface  activity   Assist           Wheelchair  Assist               Wheelchair 50 feet with 2 turns activity    Assist            Wheelchair 150 feet activity     Assist          Blood pressure (!) 156/74, pulse 78, temperature 98.1 F (36.7 C), temperature source Oral, resp. rate 15, height 5\' 3"  (1.6 m), weight 75.7 kg, SpO2 96 %.  Medical Problem List and Plan: 1.  Right side weakness and slurred speech secondary to left pontine acute perforator infarction             -patient may shower             -ELOS/Goals: modI in PT, MinA in OT, I in SLP Initial evals today  2.  Antithrombotics: -DVT/anticoagulation: Lovenox             -antiplatelet therapy: Aspirin and Plavix x3 weeks then aspirin alone 3. Pain Management: Tylenol as needed 4. Mood: Provide emotional support              -antipsychotic agents: N/A 5. Neuropsych: This patient is capable of making decisions on her own behalf. 6. Skin/Wound Care: Routine skin checks 7. Fluids/Electrolytes/Nutrition: Routine in and outs with follow-up chemistries 8.  Diabetes mellitus.  Hemoglobin A1c 11.8.  Lantus insulin 12 units daily, Glucophage 500 mg twice daily.  Diabetic teaching CBG (last 3)  Recent Labs    08/12/19 1705 08/12/19 2100 08/13/19 0607  GLUCAP 241* 183* 215*  Will stop metformin due to N/V, diarrhea , increase Lantus to 15U  9.  Hyperlipidemia.  Lipitor. 10. HTN: She was on no home medications, but is currently hypertensive. Neurology recommends permissive HTN for 1 week, in 2nd week of rehab will add hypertensive agent if BP still elevated.  Vitals:   08/12/19 1931 08/13/19 0533  BP: (!) 164/88 (!) 156/74  Pulse: 81 78  Resp: 15 15  Temp: 99.4 F (37.4 C) 98.1 F (36.7 C)  SpO2: 98% 96%  fluid bolus given diarrhea dn vomiting  10. Education: Will require stroke prevention education and education regarding the importance of good control of her HTN and DM. She was noncompliant with DM medications at home and continues to be hypertensive    LOS: 1 days A FACE TO FACE EVALUATION WAS PERFORMED  Charlett Blake 08/13/2019, 6:23 AM

## 2019-08-13 NOTE — Progress Notes (Signed)
At approximately 0515, patient c/o of nausea. She was sat up in high fowlers & asked if she could take some sips of gingerale to see if it settled her stomach. As soon as this nurse left her the gingerale & an emesis bag, patient started to vomit. Emesis was yellow in color with what looked like undigested food, 600 ml of it. Her NP came in & was given report of nausea just when she started to vomit. An order for a portable KUB & zofran was given. Zofran was given IM due to inability to stop vomiting. She has not eaten since dinner last night. No c/o pain or discomfort. Just checked on her & she stated that she felt a little better & asked to toilet. Will continue to monitor & pass on to the oncoming nurse.

## 2019-08-13 NOTE — Progress Notes (Signed)
Physical Therapy Session Note  Patient Details  Name: Jacqueline Leach MRN: [id number] Date of Birth: 1955/10/02  Today's Date: 08/13/2019 PT Individual Time: 1400-1520 PT Individual Time Calculation (min): 80 min   Short Term Goals: Week 1:  PT Short Term Goal 1 (Week 1): Pt will negotiate 3 steps with LRAD & mod assist. PT Short Term Goal 2 (Week 1): Pt will ambulate 75 ft with LRAD & min assist. PT Short Term Goal 3 (Week 1): Pt will complete bed mobility consistently with min assist. PT Short Term Goal 4 (Week 1): Pt will complete bed<>w/c transfers consistently with CGA.  Skilled Therapeutic Interventions/Progress Updates: Pt presented in w/c agreeable to therapy. Pt frustrated because IV placed in L hand (unaffected side) and feels that it is limiting her ability to do things. Provided emotional support and advised with discuss with nsg. Instructed pt in w/c propulsion and pt was able to propel w/c with minA 47f but mostly limited by size of w/c. Pt transported remaining distance to rehab gym and performed stand pivot transfer to mat minA. PTA exchanged w/c for smaller size and performed stand pivot in same manner to new w/c. Pt with improved posture and states feels more comfortable to smaller w/c. Performed stand pivot to mat in same manner as prior and performed STS from EOM x 5 with mirror feedback for BLE strengthening and manual facilitation for improved wt shifting to R. Pt noted to have no R knee instability nor buckling during activity. Participated in gait training x 155fwith RW with R orthotic and modA with PTA providing facilitation of advancement of RLE. Pt noted to have intermittent voluntary advancement of RLE. Pt did have one occurrence of R knee buckling but no LOB and pt was able to correct. Once in w/c pt propelled approx [id number] hemi technique with improved L ffot contact and good sequencing. Pt transported remaining distance to day room and performed stand pivot transfer to  NuSPortageth RW. Pt participated in NuStep L1 x 2 min then additional 5 min with R hand splint for reciprocal activity and general conditioning. Pt required min/mod multimodal cues to maintain neutral knee. Once completed performed stand pivot in same manner to w/c and transported back to room for time management. Pt remained in w/c at end of session with belt alarm on, call bell within reach and needs met.   Therapy Documentation Precautions:  Precautions Precautions: Fall Precaution Comments: R hemi Restrictions Weight Bearing Restrictions: No General:   Vital Signs: Therapy Vitals Temp: 98.2 F (36.8 C) Pulse Rate: 81 Resp: 18 BP: 128/62 Patient Position (if appropriate): Sitting Oxygen Therapy SpO2: 99 % O2 Device: Room Air    Therapy/Group: Individual Therapy  Marshae Azam  Gidget Quizhpi, PTA  08/13/2019, 3:32 PM

## 2019-08-13 NOTE — Progress Notes (Signed)
   08/13/19 1607  Clinical Encounter Type  Visited With Patient  Visit Type Initial;Other (Comment) (AD)  Referral From Physician  Consult/Referral To Chaplain  Spiritual Encounters  Spiritual Needs Emotional  This chaplain responded to MD consult for Pt. AD.  The chaplain was welcomed into the Pt. room. The Pt. expressed an interest in AD information. The chaplain left the document in the Pt. room.  The Pt. plans to review the document with her daughters and then request spiritual care assistance as needed through the RN.  The Pt. shared her emotional weariness with the chaplain, but the Pt. declined a pastoral presence at this time. The chaplain offered F/U spiritual as needed to the Pt.

## 2019-08-13 NOTE — Progress Notes (Signed)
Inpatient Rehabilitation  Patient information reviewed and entered into eRehab system by Pristine Gladhill M. Gustavus Haskin, M.A., CCC/SLP, PPS Coordinator.  Information including medical coding, functional ability and quality indicators will be reviewed and updated through discharge.    

## 2019-08-13 NOTE — Progress Notes (Signed)
Entered room and pt was tearful. Reports that cant do anything for herself. Reports that cant go to bathroom, cant eat, and cant move to do anything.  Had pt try to relax. Pt was unset that IV was placed in unaffected side and hindered her ability to complete toileting task independently, informed that IV is only temporary for small amount of fluids and will be done shortly. Pt continued that cant eat because her left hand is the only good one left and IV is making so that cant eat.  Informed her that she can eat and had eaten and that this only temporary.  Informed that will remove IV at pt request with understanding that may have to get stuck again if needs additional fluids. Pt aware. I informed if needed we can discuss an alternative site for IV so that its not so limiting. Pt agreeable. Informed PA of pt's tearfulness.

## 2019-08-13 NOTE — Evaluation (Addendum)
Physical Therapy Assessment and Plan  Patient Details  Name: Jacqueline Leach MRN: [id number] Date of Birth: 1956-05-21  PT Diagnosis: Abnormality of gait, Coordination disorder, Difficulty walking, Hemiparesis dominant and Muscle weakness Rehab Potential: Good ELOS: 2 weeks   Today's Date: 08/13/2019 PT Individual Time: 0825-0925 PT Individual Time Calculation (min): 60 min    Problem List:  Patient Active Problem List   Diagnosis Date Noted  . Fall at home, initial encounter 08/12/2019  . Hematoma of right thigh 08/12/2019  . Hematemesis 08/12/2019  . Essential hypertension 08/12/2019  . Hyperlipidemia 08/12/2019  . Type II diabetes mellitus, uncontrolled ([place]) 08/12/2019  . Obesity 08/12/2019  . Noncompliance with medication regimen 08/12/2019  . Left pontine cerebrovascular accident ([place]) 08/12/2019  . Ischemic stroke ([place]) - L pontine d/t small vessel dz s/p IV tPA 08/09/2019    Past Medical History: History reviewed. No pertinent past medical history. Past Surgical History: History reviewed. No pertinent surgical history.  Assessment & Plan Clinical Impression: Patient is a 63 y.o. year old right-handed female with history of diabetes mellitus noncompliant with medications, hypertension on no antihypertensive medications.  Per chart review she lives with her children and assistance as needed.  Reportedly independent prior to admission driving as well as independent ADLs.  She is a retired Merchandiser, retail.  1 level home 3 steps to entry.  Presented 08/09/2019 from [facility] with sudden onset of right side weakness resulting in a fall and slurred speech as well as hematoma right leg.  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  Remote lacunar infarct in the right cerebral white matter.  No hemorrhage or hydrocephalus noted.  TPA was initiated but stopped due to some hematemesis and hematoma right thigh.  Echocardiogram with ejection fraction 65% without  emboli.  Carotid Dopplers unremarkable.  CT of head outside hospital showed no large vessel occlusion.  Admission chemistries with SARS coronavirus negative, hemoglobin A1c 11.8, WBC 10,900.  Neurology follow-up maintained on aspirin and Plavix for CVA prophylaxis x3 weeks then aspirin alone.  Subcutaneous Lovenox for DVT prophylaxis.  Tolerating a regular diet.  Therapy evaluations completed and patient was admitted for a comprehensive rehab program..  Patient transferred to CIR on 08/12/2019 .   Patient currently requires min assist overall except max assist for bed mobility with mobility secondary to muscle weakness, decreased cardiorespiratoy endurance, decreased coordination,  and decreased standing balance, decreased postural control, decreased balance strategies and R hemiparesis.  Prior to hospitalization, patient was independent  with mobility and lived with Family, Daughter(adult daughters & 67 y/o & 2 y/o grandchildren Simultaneous filing. User may not have seen previous data.) in a House(Simultaneous filing. User may not have seen previous data.) home.  Home access is 3(Simultaneous filing. User may not have seen previous data.)Stairs to enter(Simultaneous filing. User may not have seen previous data.).  Patient will benefit from skilled PT intervention to maximize safe functional mobility, minimize fall risk and decrease caregiver burden for planned discharge home with 24 hour supervision.  Anticipate patient will HHPT vs OPPT at discharge.  PT - End of Session Activity Tolerance: Tolerates 30+ min activity with multiple rests Endurance Deficit: Yes Endurance Deficit Description: 2/2 generalized deconditioning PT Assessment Rehab Potential (ACUTE/IP ONLY): Good PT Barriers to Discharge: Home environment access/layout PT Barriers to Discharge Comments: 3 steps without rails to enter home PT Patient demonstrates impairments in the following area(s): Balance;Endurance;Motor;Pain;Safety PT  Transfers Functional Problem(s): Bed Mobility;Bed to Chair;Car;Furniture PT Locomotion Functional Problem(s): Ambulation;Wheelchair Mobility;Stairs PT  Plan PT Intensity: Minimum of 1-2 x/day ,45 to 90 minutes PT Frequency: 5 out of 7 days PT Duration Estimated Length of Stay: 2 weeks PT Treatment/Interventions: Ambulation/gait training;Cognitive remediation/compensation;Discharge planning;DME/adaptive equipment instruction;Functional mobility training;Pain management;Psychosocial support;Splinting/orthotics;Therapeutic Activities;UE/LE Strength taining/ROM;Wheelchair propulsion/positioning;UE/LE Coordination activities;Therapeutic Exercise;Skin care/wound management;Stair training;Patient/family education;Neuromuscular re-education;Functional electrical stimulation;Community reintegration;Balance/vestibular training;Disease management/prevention PT Transfers Anticipated Outcome(s): supervision with LRAD PT Locomotion Anticipated Outcome(s): supervision with LRAD except min assist 3 steps without rails with LRAD PT Recommendation Recommendations for Other Services: Neuropsych consult;Therapeutic Recreation consult Therapeutic Recreation Interventions: Kitchen group;Stress management Follow Up Recommendations: 24 hour supervision/assistance(HHPT vs OPPT) Patient destination: Home Equipment Recommended: Rolling walker with 5" wheels Equipment Details: TBD on w/c for community use  Skilled Therapeutic Intervention Patient received in bed, reporting significant nausea & dizziness after emesis episode earlier this morning - nurse already aware. Pt agreeable & PT evaluation initiated. Educated pt on daily therapy schedule, weekly team meetings, safety plan (need for staff assistance when wanting to get OOB or out of w/c), ELOS, and other CIR information. Provided pt with w/c cushion and R half lap tray to reduce risk of skin breakdown when OOB & support RUE. Pt transfers supine>sitting EOB with max assist  with difficulty uprighting trunk even with use of bed rails. Pt transfers bed>w/c and w/c<>car at sedan simulated height without AD & min assist via stand pivot with therapist providing tactile cuing at R knee to prevent buckling but little to none noted. Pt brushed teeth at sink from w/c level with assistance for applying toothpaste onto toothbrush. Pt ambulates 30 ft at rail with mod assist + w/c follow for safety with gait pattern as noted below. At end session pt left in w/c with chair alarm donned & all needs in reach.  BP during session 130/71 mmHg (LUE), HR = 85 bpm.   Addendum: therapist donned B ted hose with RN aware.  PT Evaluation Precautions/Restrictions Precautions Precautions: Fall Precaution Comments: R hemi Restrictions Weight Bearing Restrictions: No  General Chart Reviewed: Yes Additional Pertinent History: DM, arthritis, HTN Response to Previous Treatment: Not applicable Family/Caregiver Present: No  Pain No c/o pain reported during session.  Home Living/Prior Functioning Home Living Living Arrangements: Children;Other relatives Available Help at Discharge: Family;Available 24 hours/day Type of Home: House Home Access: Stairs to enter [employer] of Steps: 3 Entrance Stairs-Rails: None Home Layout: One level  Lives With: Family;Daughter(adult daughters & 39 y/o & 7 y/o grandchildren Prior Function Level of Independence: Independent with basic ADLs;Independent with transfers;Independent with gait;Independent with homemaking with ambulation  Able to Take Stairs?: Yes Driving: Yes  Vocation: Retired Leisure: Hobbies-yes (Comment) Comments: kept 3 y/o grandchild 2 days/week  Vision/Perception  Pt wears glasses at all times at baseline. Pt denies changes in baseline vision. No apparent visual deficits. Perception appears WNL.  Cognition Overall Cognitive Status: Within Functional Limits for tasks assessed Arousal/Alertness:  Awake/alert Orientation Level: Oriented X4 Memory: Appears intact Immediate Memory Recall: Sock;Blue;Bed Memory Recall Sock: Without Cue Memory Recall Blue: Without Cue Memory Recall Bed: Without Cue Awareness: Appears intact Problem Solving: Appears intact Safety/Judgment: Appears intact  Sensation Sensation Light Touch: Appears Intact(RUE, RLE) Proprioception: Appears Intact(RUE, RLE) Coordination Gross Motor Movements are Fluid and Coordinated: No Fine Motor Movements are Fluid and Coordinated: No Coordination and Movement Description: impaired RUE & RLE 2/2 hemiparesis  Motor  Motor Motor: Abnormal postural alignment and control Motor - Skilled Clinical Observations: generalized deconditioning, R hemiparesis   Mobility Bed Mobility Bed Mobility: Supine to Sit Supine to Sit:  Maximal Assistance - Patient - Patient 25-49%(bed rails, HOB slightly elevated) Transfers Transfers: Sit to Stand;Stand to Sit;Stand Pivot Transfers Sit to Stand: Minimal Assistance - Patient > 75% Stand to Sit: Minimal Assistance - Patient > 75% Stand Pivot Transfers: Minimal Assistance - Patient > 75% Stand Pivot Transfer Details: Verbal cues for sequencing;Verbal cues for technique Transfer (Assistive device): None   Locomotion  Gait Ambulation: Yes Gait Assistance: Moderate Assistance - Patient 50-74% Gait Distance (Feet): 30 Feet Assistive device: (rail in hallway) Gait Assistance Details: assistance with RLE advancement, tactile cuing to block R knee to prevent buckling Gait Gait: Yes Gait Pattern: Decreased hip/knee flexion - right;Decreased step length - right;Decreased stance time - right;Decreased stride length;Decreased dorsiflexion - right;Decreased weight shift to right(decreased foot clearance RLE, absent heel strike RLE) Gait velocity: decreased Stairs / Additional Locomotion Stairs: No Wheelchair Mobility Wheelchair Mobility: No   Trunk/Postural Assessment  Cervical  Assessment Cervical Assessment: Within Functional Limits Thoracic Assessment Thoracic Assessment: Exceptions to WFL(rounded shoulders) Postural Control Postural Control: Deficits on evaluation Righting Reactions: delayed Protective Responses: delayed   Balance Balance Balance Assessed: Yes Static Sitting Balance Static Sitting - Balance Support: Feet supported;Left upper extremity supported Static Sitting - Level of Assistance: 5: Stand by assistance Dynamic Standing Balance Dynamic Standing - Balance Support: Left upper extremity supported;During functional activity Dynamic Standing - Level of Assistance: 3: Mod assist Dynamic Standing - Comments: gait with rail   Extremity Assessment  RUE not formally tested, no active movement observed  LUE Assessment LUE Assessment: Within Functional Limits Pt unable to activate R quads for knee extension on command but able to activate hip flexors when attempting to transfer RLE into car, and able to initiate RLE advancement during gait, only slight knee flexion/buckling noted in R knee during gait, pt with movement in R toes but unable to perform on command. LLE Assessment LLE Assessment: Within Functional Limits    Refer to Care Plan for Long Term Goals  Recommendations for other services: Neuropsych and Therapeutic Recreation  Kitchen group and Stress management  Discharge Criteria: Patient will be discharged from PT if patient refuses treatment 3 consecutive times without medical reason, if treatment goals not met, if there is a change in medical status, if patient makes no progress towards goals or if patient is discharged from hospital.  The above assessment, treatment plan, treatment alternatives and goals were discussed and mutually agreed upon: by patient  Waunita Schooner 08/13/2019, 11:39 AM

## 2019-08-13 NOTE — Care Management Note (Signed)
Inpatient [facility] Statement of Services  Patient Name:  Jacqueline Leach  Date:  08/13/2019  Welcome to the [facility].  Our goal is to provide you with an individualized program based on your diagnosis and situation, designed to meet your specific needs.  With this comprehensive rehabilitation program, you will be expected to participate in at least 3 hours of rehabilitation therapies Monday-Friday, with modified therapy programming on the weekends.  Your rehabilitation program will include the following services:  Physical Therapy (PT), Occupational Therapy (OT), Speech Therapy (ST), 24 hour per day rehabilitation nursing, Neuropsychology, Case Management (Social Worker), Rehabilitation Medicine, Nutrition Services and Pharmacy Services  Weekly team conferences will be held on Wednesday to discuss your progress.  Your Social Worker will talk with you frequently to get your input and to update you on team discussions.  Team conferences with you and your family in attendance may also be held.  Expected length of stay: 14-17 days  Overall anticipated outcome: supervision level with cues  Depending on your progress and recovery, your program may change. Your Social Worker will coordinate services and will keep you informed of any changes. Your Social Worker's name and contact numbers are listed  below.  The following services may also be recommended but are not provided by the [facility] will be made to provide these services after discharge if needed.  Arrangements include referral to agencies that provide these services.  Your insurance has been verified to be:  Uninsured-applying for Medicaid Your primary doctor is:  None  Pertinent information will be shared with your doctor and your insurance company.  Social  Worker:  Ovidio Kin, [place] or ([phone]  Information discussed with and copy given to patient by: Elease Hashimoto, 08/13/2019, 8:47 AM

## 2019-08-14 ENCOUNTER — Inpatient Hospital Stay (HOSPITAL_COMMUNITY): Payer: Self-pay | Admitting: Occupational Therapy

## 2019-08-14 ENCOUNTER — Inpatient Hospital Stay (HOSPITAL_COMMUNITY): Payer: Self-pay | Admitting: Physical Therapy

## 2019-08-14 LAB — GLUCOSE, CAPILLARY
Glucose-Capillary: 224 mg/dL — ABNORMAL HIGH (ref 70–99)
Glucose-Capillary: 148 mg/dL — ABNORMAL HIGH (ref 70–99)
Glucose-Capillary: 178 mg/dL — ABNORMAL HIGH (ref 70–99)
Glucose-Capillary: 184 mg/dL — ABNORMAL HIGH (ref 70–99)

## 2019-08-14 MED ORDER — LOPERAMIDE HCL 2 MG PO CAPS
2.0000 mg | ORAL_CAPSULE | Freq: Once | ORAL | Status: AC
  Administered 2019-08-14: 2 mg via ORAL
  Filled 2019-08-14: qty 1

## 2019-08-14 MED ORDER — LOPERAMIDE HCL 2 MG PO CAPS
2.0000 mg | ORAL_CAPSULE | ORAL | Status: DC | PRN
  Administered 2019-08-14: 2 mg via ORAL
  Filled 2019-08-14: qty 1

## 2019-08-14 MED ORDER — LIVING WELL WITH DIABETES BOOK
Freq: Once | Status: AC
  Administered 2019-08-14: 19:00:00
  Filled 2019-08-14: qty 1

## 2019-08-14 NOTE — Progress Notes (Signed)
On call provider called back & Metformin was discontinued due to physician's note yesterday morning & order given to give another dose of imodium if not effective up to maximum dose if needed. Imodium was given as previously ordered.

## 2019-08-14 NOTE — Progress Notes (Signed)
Social Work Patient ID: Jacqueline Leach, female   DOB: 19-Jan-1956, 63 y.o.   MRN: [id number]  Met with pt to provide support and give her a team conference update. Goals of supervision level and target discharge 12/17. She had a nap and feels better now. Discussed to hang in there form this day forward her bowels and sleep should be better. She is smiling and pushing forward. Her main goal is to get back home by Christmas, so is glad her date is the week before Christmas to go home. Will have neuro-psych see Monday and continue to provide support to her while here.

## 2019-08-14 NOTE — Patient Care Conference (Signed)
Inpatient RehabilitationTeam Conference and Plan of Care Update Date: 08/14/2019   Time: 10:00 AM   Patient Name: Jacqueline Leach      Medical Record Number: [id number]  Date of Birth: 1956/04/20 Sex: Female         Room/Bed: 4W23C/4W23C-01 Payor Info: Payor: MEDICAID POTENTIAL / Plan: MEDICAID POTENTIAL / Product Type: *No Product type* /    Admit Date/Time:  08/12/2019  4:09 PM  Primary Diagnosis:  <principal problem not specified>  Patient Active Problem List   Diagnosis Date Noted  . Fall at home, initial encounter 08/12/2019  . Hematoma of right thigh 08/12/2019  . Hematemesis 08/12/2019  . Essential hypertension 08/12/2019  . Hyperlipidemia 08/12/2019  . Type II diabetes mellitus, uncontrolled ([place]) 08/12/2019  . Obesity 08/12/2019  . Noncompliance with medication regimen 08/12/2019  . Left pontine cerebrovascular accident ([place]) 08/12/2019  . Ischemic stroke ([place]) - L pontine d/t small vessel dz s/p IV tPA 08/09/2019    Expected Discharge Date: Expected Discharge Date: 08/29/19  Team Members Present: Physician leading conference: Dr. Alysia Penna Social Worker Present: Ovidio Kin, LCSW Nurse Present: Isla Pence, RN Case Manager: Karene Fry, RN PT Present: Barrie Folk, PT;Rosita Dechalus, PTA OT Present: Darleen Crocker, OT SLP Present: Charolett Bumpers, SLP PPS Coordinator present : Gunnar Fusi, SLP     Current Status/Progress Goal Weekly Team Focus  Bowel/Bladder   Patient currently incontinent of bowels d/t episodes of diarrhea and continent of bladder  to resolve cause of diarrhea and back to being continent with B/B  Assessment q shift and PRN   Swallow/Nutrition/ Hydration             ADL's   mod A overall  supervision  ADL retraining, balance, pt/family education, NMR   Mobility   maxA bed mobiltiy, minA STS transfers, modA gait with wall rail and RW,  supervision overall except min assist 3 steps without rails with LRAD  R NMR, balance,  strengthening,   Communication             Safety/Cognition/ Behavioral Observations            Pain   no pain verbalized  continue with no pain during activity or therapy  Assess and manage pain with PRN/scheduled analgesics q shift   Skin   Patient has a right hip and left lower abdominal bruise.  keeping area from skin breakdown  skin assessment and repositioning q shift and PRN      *See Care Plan and progress notes for long and short-term goals.     Barriers to Discharge  Current Status/Progress Possible Resolutions Date Resolved   Nursing  New diabetic;Medication compliance               PT  Home environment access/layout  3 steps without rails to enter home              OT (none known at this time)                SLP                SW Other (comments) Unisured was not seeing a MD prior to admission            Discharge Planning/Teaching Needs:  Home with two daughter's and grandchildren. One daughter doesn't work and can provide 24 hr care. Neuro-psych going to see for coping.      Team Discussion: Diarrhea, N/V, DC'd metformin, changed diabetes meds, hemiparesis.  RN -  inc diarrhea, encouraged to drink, emotional/tearful, neuropsych to see?  OT mod overall, crying, could not participate, wanted to sleep.  PT max bed, min/mod transfers, mod gait RW, S w/c mobility 75', S goals, stairs min A goals for 3 steps.  Will go home with 2 daughters.   Revisions to Treatment Plan: N/A     Medical Summary Current Status: Diarrhea secondary to Metformin with nausea and vomiting as well. Weekly Focus/Goal: Improve diabetic management without Metformin use     Possible Resolutions to Barriers: See above continue rehabilitation program initiation   Continued Need for Acute Rehabilitation Level of Care: The patient requires daily medical management by a physician with specialized training in physical medicine and rehabilitation for the following reasons: Direction of a  multidisciplinary physical rehabilitation program to maximize functional independence : Yes Medical management of patient stability for increased activity during participation in an intensive rehabilitation regime.: Yes Analysis of laboratory values and/or radiology reports with any subsequent need for medication adjustment and/or medical intervention. : Yes   I attest that I was present, lead the team conference, and concur with the assessment and plan of the team.   Jodell Cipro M 08/14/2019, 1:06 PM  Team conference was held via web/ teleconference due to [place] - 19

## 2019-08-14 NOTE — Progress Notes (Signed)
Physical Therapy Session Note  Patient Details  Name: Jacqueline Leach MRN: [id number] Date of Birth: 04-09-1956  Today's Date: 08/14/2019 PT Individual Time: 1330-1440 PT Individual Time Calculation (min): 70 min   Short Term Goals: Week 1:  PT Short Term Goal 1 (Week 1): Pt will negotiate 3 steps with LRAD & mod assist. PT Short Term Goal 2 (Week 1): Pt will ambulate 75 ft with LRAD & min assist. PT Short Term Goal 3 (Week 1): Pt will complete bed mobility consistently with min assist. PT Short Term Goal 4 (Week 1): Pt will complete bed<>w/c transfers consistently with CGA.  Skilled Therapeutic Interventions/Progress Updates:   Pt received sitting in WC and agreeable to PT. Pt transported to day room. Stand pivot transfer to mat table with min-mod assist and moderate cues for attention to the RLE, pt noted to have adequate hip abduction with RLE, but mild knee instability.   Sit<>stand from [place] 2 x 5 with BUE supported on RW and min assist overall from PT. pregait stepping task to perform foot tap on 2 inch step3 x 5 BLE. Inconsistent hip/knee flexion to advance the RLE to bring to step. Significant activation noted in the RLE glutes to slide foot off of step. Max cues for improved sequencing to activate HS and glutes to bring foot off step.   Forced use of the RUE and RLE through gait training with RW 2 x 23f with mod assist overall for posture and RLE limb advancement. Min cues for increased step length to improve activation of the R hip flexors to advance the LE.   Kinetron reciprocal movement training with BLE 4 x 1 min each with min cues for symmetry of movement and full ROM on the RLE.   [facility] returned to [facility] and [facility] and all needs met.         Therapy Documentation Precautions:  Precautions Precautions: Fall Precaution Comments: R hemi Restrictions Weight Bearing Restrictions: No Vital Signs: Therapy Vitals Temp: 98.5 F (36.9  C) Pulse Rate: 85 Resp: 18 BP: 126/66 Patient Position (if appropriate): Sitting Oxygen Therapy SpO2: 96 % O2 Device: Room Air Pain: denies   Therapy/Group: Individual Therapy  ALorie Phenix12/10/2018, 5:46 PM

## 2019-08-14 NOTE — Progress Notes (Signed)
[place] PHYSICAL MEDICINE & REHABILITATION PROGRESS NOTE   Subjective/Complaints:   RN was nauseated today , vomiting and diarrhea,last noc  Last Metformin dose at 6p last noc  ROS- denies CP, SOB, No joint pain no back pain    Objective:   Dg Abd Portable 1v  Result Date: 08/13/2019 CLINICAL DATA:  Nausea and vomiting today. EXAM: PORTABLE ABDOMEN - 1 VIEW COMPARISON:  None. FINDINGS: The abdominal bowel gas pattern is unremarkable. There is scattered air in the small bowel and colon but no findings to suggest obstruction or perforation. The soft tissue shadows are maintained. The bony structures are intact. IMPRESSION: No plain film findings for an acute abdominal process. Electronically Signed   By: Marijo Sanes M.D.   On: 08/13/2019 07:21   Recent Labs    08/12/19 0335 08/13/19 0437  WBC 11.6* 10.7*  HGB 11.4* 11.9*  HCT 32.9* 35.3*  PLT 188 211   Recent Labs    08/12/19 0335 08/13/19 0437  NA 138 139  K 3.4* 3.7  CL 103 102  CO2 24 23  GLUCOSE 218* 214*  BUN 18 16  CREATININE 0.50 0.53  CALCIUM 8.4* 9.2    Intake/Output Summary (Last 24 hours) at 08/14/2019 3846 Last data filed at 08/13/2019 1255 Gross per 24 hour  Intake 100 ml  Output -  Net 100 ml     Physical Exam: Vital Signs Blood pressure 120/63, pulse 76, temperature 98 F (36.7 C), temperature source Oral, resp. rate 18, height _0  (1.6 m), weight 76.2 kg, SpO2 97 %.   General: No acute distress Mood and affect are appropriate Heart: Regular rate and rhythm no rubs murmurs or extra sounds Lungs: Clear to auscultation, breathing unlabored, no rales or wheezes Abdomen: Positive bowel sounds, soft nontender to palpation, nondistended Extremities: No clubbing, cyanosis, or edema Skin: No evidence of breakdown, no evidence of rash Neurologic: Cranial nerves II through XII intact, motor strength is 5/5 in LEFT  deltoid, bicep, tricep, grip, hip flexor, knee extensors, ankle dorsiflexor and  plantar flexor 2- RIght biceps and delt, 3- Right Knee ext , 0/5 in Rihgt ankle DF/PF and HF  Sensory exam normal sensation to light touch and proprioception in bilateral upper and lower extremities  Musculoskeletal: Full range of motion in all 4 extremities. No joint swelling   Assessment/Plan: 1. 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  Physiatrist is providing close team supervision and 24 hour management of active medical problems listed below.  Physiatrist and rehab team continue to assess barriers to discharge/monitor patient progress toward functional and medical goals  Care Tool:  Bathing    Body parts bathed by patient: Right arm, Chest, Abdomen, Right upper leg, Left upper leg, Face   Body parts bathed by helper: Left arm, Front perineal area, Buttocks, Right lower leg, Left lower leg     Bathing assist Assist Level: Moderate Assistance - Patient 50 - 74%     Upper Body Dressing/Undressing Upper body dressing   What is the patient wearing?: Pull over shirt    Upper body assist Assist Level: Moderate Assistance - Patient 50 - 74%    Lower Body Dressing/Undressing Lower body dressing      What is the patient wearing?: Incontinence brief, Pants     Lower body assist Assist for lower body dressing: Moderate Assistance - Patient 50 - 74%     Toileting Toileting  Toileting assist Assist for toileting: Maximal Assistance - Patient 25 - 49%     Transfers Chair/bed transfer  Transfers assist  Chair/bed transfer activity did not occur: Safety/medical concerns  Chair/bed transfer assist level: Moderate Assistance - Patient 50 - 74%     Locomotion Ambulation   Ambulation assist   Ambulation activity did not occur: Safety/medical concerns          Walk 10 feet activity   Assist  Walk 10 feet activity did not occur:  Safety/medical concerns        Walk 50 feet activity   Assist Walk 50 feet with 2 turns activity did not occur: Safety/medical concerns         Walk 150 feet activity   Assist Walk 150 feet activity did not occur: Safety/medical concerns         Walk 10 feet on uneven surface  activity   Assist Walk 10 feet on uneven surfaces activity did not occur: Safety/medical concerns         Wheelchair     Assist     Wheelchair activity did not occur: Safety/medical concerns         Wheelchair 50 feet with 2 turns activity    Assist    Wheelchair 50 feet with 2 turns activity did not occur: Safety/medical concerns       Wheelchair 150 feet activity     Assist  Wheelchair 150 feet activity did not occur: Safety/medical concerns       Blood pressure 120/63, pulse 76, temperature 98 F (36.7 C), temperature source Oral, resp. rate 18, height _0  (1.6 m), weight 76.2 kg, SpO2 97 %.  Medical Problem List and Plan: 1.  Right side weakness and slurred speech secondary to left pontine acute perforator infarction             -patient may shower             -ELOS/Goals: modI in PT, MinA in OT, I in SLP Team conference today please see physician documentation under team conference tab, met with team face-to-face to discuss problems,progress, and goals. Formulized individual treatment plan based on medical history, underlying problem and comorbidities. 2.  Antithrombotics: -DVT/anticoagulation: Lovenox             -antiplatelet therapy: Aspirin and Plavix x3 weeks then aspirin alone 3. Pain Management: Tylenol as needed 4. Mood: Provide emotional support             -antipsychotic agents: N/A 5. Neuropsych: This patient is capable of making decisions on her own behalf. 6. Skin/Wound Care: Routine skin checks 7. Fluids/Electrolytes/Nutrition: Routine in and outs with follow-up chemistries 8.  Diabetes mellitus.  Hemoglobin A1c 11.8.  non compliant with  meds at home   Diabetic teaching CBG (last 3)  Recent Labs    08/13/19 1717 08/13/19 2107 08/14/19 0629  GLUCAP 160* 161* 224*  stop  metformin due to N/V last dose 6pm yesterday , diarrhea , increase Lantus to 18U  9.  Hyperlipidemia.  Lipitor. 10. HTN: She was on no home medications, but is currently hypertensive. Neurology recommends permissive HTN for 1 week, in 2nd week of rehab will add hypertensive agent if BP still elevated.  Vitals:   08/14/19 0147 08/14/19 0631  BP: (!) 147/67 120/63  Pulse: 79 76  Resp: 16 18  Temp: 98.2 F (36.8 C) 98 F (36.7 C)  SpO2: 97% 97%  BP well controlled   10. Education: Will require stroke  prevention education and education regarding the importance of good control of her HTN and DM. She was noncompliant with DM medications at home and continues to be hypertensive    LOS: 2 days A FACE TO FACE EVALUATION WAS PERFORMED  Jacqueline Leach 08/14/2019, 9:27 AM

## 2019-08-14 NOTE — Progress Notes (Signed)
Occupational Therapy Session Note  Patient Details  Name: Wilsie Forbus MRN: [id number] Date of Birth: 01/17/1956  Today's Date: 08/14/2019 OT Individual Time: [id number] OT Individual Time Calculation (min): 15 min  and Today's Date: 08/14/2019 OT Missed Time: 14 Minutes Missed Time Reason: Patient fatigue   Short Term Goals: Week 1:  OT Short Term Goal 1 (Week 1): Pt will perform shower transfer with min A overall. OT Short Term Goal 2 (Week 1): Pt will perform toileting with min A overall. OT Short Term Goal 3 (Week 1): Pt will perform UB dressing with min A overall. OT Short Term Goal 4 (Week 1): Pt will perform LB dressing with min A overall.  Skilled Therapeutic Interventions/Progress Updates:    Upon entering the room, pt supine in bed and when asked about her night pt is very tearful. Pt upset over multiple BMs last night, not sleeping well, and feels nauseated. OT unable to redirect and provided therapeutic use of self. OT did review schedule with pt verbalizing she would attempt with next session but requesting to rest at this time. Bed alarm activated and call bell within reach upon exiting the room.   Therapy Documentation Precautions:  Precautions Precautions: Fall Precaution Comments: R hemi Restrictions Weight Bearing Restrictions: No General: General OT Amount of Missed Time: 45 Minutes PT Missed Treatment Reason: Patient fatigue   Therapy/Group: Individual Therapy  Gypsy Decant 08/14/2019, 12:28 PM

## 2019-08-14 NOTE — Progress Notes (Signed)
Pt has had much better day after morning. Daughter came by this afternoon. Was able to do some teaching with pt regarding diabetes and provided Living Well with Diabetes and additional handouts from clinical resources.

## 2019-08-14 NOTE — Progress Notes (Signed)
Physical Therapy Session Note  Patient Details  Name: Jacqueline Leach MRN: [id number] Date of Birth: 04-10-1956  Today's Date: 08/14/2019     Short Term Goals: Week 1:  PT Short Term Goal 1 (Week 1): Pt will negotiate 3 steps with LRAD & mod assist. PT Short Term Goal 2 (Week 1): Pt will ambulate 75 ft with LRAD & min assist. PT Short Term Goal 3 (Week 1): Pt will complete bed mobility consistently with min assist. PT Short Term Goal 4 (Week 1): Pt will complete bed<>w/c transfers consistently with CGA.  Skilled Therapeutic Interventions/Progress Updates: Pt missed 60 min skilled PT due to pt sleeping after moving pt's scheduled time by 45 min. Pt did not sleep previous night due to incontinent bowel and bladder. Will continue efforts.      Therapy Documentation Precautions:  Precautions Precautions: Fall Precaution Comments: R hemi Restrictions Weight Bearing Restrictions: No General:   Vital Signs: Therapy Vitals Temp: 98 F (36.7 C) Temp Source: Oral Pulse Rate: 76 Resp: 18 BP: 120/63 Patient Position (if appropriate): Lying Oxygen Therapy SpO2: 97 % O2 Device: Room Air    Therapy/Group: Individual Therapy  Umberto Pavek  Alieu Finnigan, PTA  08/14/2019, 7:56 AM

## 2019-08-14 NOTE — Progress Notes (Signed)
Pt tearful this am. Jacqueline Leach had a bad night that was up all night with diarrhea.

## 2019-08-14 NOTE — Progress Notes (Signed)
Patient has had 5 loose bowel movements. No c/o abdominal pain or nausea. The BM does not have a foul smell & her vitals are WNL. On call provider was called & an order for a one time dose of imodium was given. Will continue to monitor for changes.

## 2019-08-15 ENCOUNTER — Inpatient Hospital Stay (HOSPITAL_COMMUNITY): Payer: Self-pay | Admitting: Physical Therapy

## 2019-08-15 ENCOUNTER — Inpatient Hospital Stay (HOSPITAL_COMMUNITY): Payer: Self-pay | Admitting: Occupational Therapy

## 2019-08-15 LAB — GLUCOSE, CAPILLARY
Glucose-Capillary: 149 mg/dL — ABNORMAL HIGH (ref 70–99)
Glucose-Capillary: 268 mg/dL — ABNORMAL HIGH (ref 70–99)
Glucose-Capillary: 209 mg/dL — ABNORMAL HIGH (ref 70–99)
Glucose-Capillary: 171 mg/dL — ABNORMAL HIGH (ref 70–99)

## 2019-08-15 MED ORDER — TRAZODONE HCL 50 MG PO TABS
50.0000 mg | ORAL_TABLET | Freq: Every day | ORAL | Status: DC
  Administered 2019-08-15 – 2019-08-28 (×14): 50 mg via ORAL
  Filled 2019-08-15 (×14): qty 1

## 2019-08-15 NOTE — Progress Notes (Signed)
[place] PHYSICAL MEDICINE & REHABILITATION PROGRESS NOTE   Subjective/Complaints: Off metformin, feels better Poor sleep, awoken by bladder yest pm   no nausea or vomiting, had yogurt and banana this am  ROS- denies CP, SOB, No joint pain no back pain    Objective:   No results found. Recent Labs    08/13/19 0437  WBC 10.7*  HGB 11.9*  HCT 35.3*  PLT 211   Recent Labs    08/13/19 0437  NA 139  K 3.7  CL 102  CO2 23  GLUCOSE 214*  BUN 16  CREATININE 0.53  CALCIUM 9.2    Intake/Output Summary (Last 24 hours) at 08/15/2019 0751 Last data filed at 08/15/2019 0718 Gross per 24 hour  Intake 460 ml  Output -  Net 460 ml     Physical Exam: Vital Signs Blood pressure 132/60, pulse 76, temperature 98.3 F (36.8 C), resp. rate 16, height 5\' 3"  (1.6 m), weight 76.2 kg, SpO2 94 %.   General: No acute distress Mood and affect are appropriate Heart: Regular rate and rhythm no rubs murmurs or extra sounds Lungs: Clear to auscultation, breathing unlabored, no rales or wheezes Abdomen: Positive bowel sounds, soft nontender to palpation, nondistended Extremities: No clubbing, cyanosis, or edema Skin: No evidence of breakdown, no evidence of rash Neurologic: Cranial nerves II through XII intact, motor strength is 5/5 in LEFT  deltoid, bicep, tricep, grip, hip flexor, knee extensors, ankle dorsiflexor and plantar flexor 2- RIght biceps and delt, 3- Right Knee ext , 0/5 in Rihgt ankle DF/PF and HF  Sensory exam normal sensation to light touch and proprioception in bilateral upper and lower extremities  Musculoskeletal: Full range of motion in all 4 extremities. No joint swelling   Assessment/Plan: 1. 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  Physiatrist is providing close team supervision and 24 hour management of active medical problems listed  below.  Physiatrist and rehab team continue to assess barriers to discharge/monitor patient progress toward functional and medical goals  Care Tool:  Bathing    Body parts bathed by patient: Right arm, Chest, Abdomen, Right upper leg, Left upper leg, Face   Body parts bathed by helper: Left arm, Front perineal area, Buttocks, Right lower leg, Left lower leg     Bathing assist Assist Level: Moderate Assistance - Patient 50 - 74%     Upper Body Dressing/Undressing Upper body dressing   What is the patient wearing?: Pull over shirt    Upper body assist Assist Level: Moderate Assistance - Patient 50 - 74%    Lower Body Dressing/Undressing Lower body dressing      What is the patient wearing?: Underwear/pull up, Pants     Lower body assist Assist for lower body dressing: Moderate Assistance - Patient 50 - 74%     Toileting Toileting    Toileting assist Assist for toileting: Maximal Assistance - Patient 25 - 49%     Transfers Chair/bed transfer  Transfers assist  Chair/bed transfer activity did not occur: Safety/medical concerns  Chair/bed transfer assist level: Moderate Assistance - Patient 50 - 74%     Locomotion Ambulation   Ambulation assist   Ambulation activity did not occur: Safety/medical concerns          Walk 10 feet activity   Assist  Walk 10 feet activity did not occur: Safety/medical concerns        Walk  50 feet activity   Assist Walk 50 feet with 2 turns activity did not occur: Safety/medical concerns         Walk 150 feet activity   Assist Walk 150 feet activity did not occur: Safety/medical concerns         Walk 10 feet on uneven surface  activity   Assist Walk 10 feet on uneven surfaces activity did not occur: Safety/medical concerns         Wheelchair     Assist     Wheelchair activity did not occur: Safety/medical concerns         Wheelchair 50 feet with 2 turns activity    Assist     Wheelchair 50 feet with 2 turns activity did not occur: Safety/medical concerns       Wheelchair 150 feet activity     Assist  Wheelchair 150 feet activity did not occur: Safety/medical concerns       Blood pressure 132/60, pulse 76, temperature 98.3 F (36.8 C), resp. rate 16, height 5\' 3"  (1.6 m), weight 76.2 kg, SpO2 94 %.  Medical Problem List and Plan: 1.  Right side weakness and slurred speech secondary to left pontine acute perforator infarction             -patient may shower             -ELOS/Goals: modI in PT, MinA in OT, I in SLP  2.  Antithrombotics: -DVT/anticoagulation: Lovenox             -antiplatelet therapy: Aspirin and Plavix x3 weeks then aspirin alone 3. Pain Management: Tylenol as needed 4. Mood: Provide emotional support             -antipsychotic agents: N/A 5. Neuropsych: This patient is capable of making decisions on her own behalf. 6. Skin/Wound Care: Routine skin checks 7. Fluids/Electrolytes/Nutrition: Routine in and outs with follow-up chemistries 8.  Diabetes mellitus.  Hemoglobin A1c 11.8.  non compliant with meds at home   Diabetic teaching CBG (last 3)  Recent Labs    08/14/19 1629 08/14/19 2116 08/15/19 0558  GLUCAP 178* 184* 149*  off metformin Lantus to 18U -CBG ok this am  9.  Hyperlipidemia.  Lipitor. 10. HTN: She was on no home medications, but is currently hypertensive. Neurology recommends permissive HTN for 1 week, in 2nd week of rehab will add hypertensive agent if BP still elevated.  Vitals:   08/14/19 1939 08/15/19 0434  BP: 138/65 132/60  Pulse: 87 76  Resp: 16 16  Temp: 98.3 F (36.8 C) 98.3 F (36.8 C)  SpO2: 95% 94%  BP well controlled  12/3 10. Education: Will require stroke prevention education and education regarding the importance of good control of her HTN and DM. She was noncompliant with DM medications at home and continues to be hypertensive   11.  Insomnia schedule trazodone  LOS: 3 days A FACE TO  FACE EVALUATION WAS PERFORMED  Charlett Blake 08/15/2019, 7:51 AM

## 2019-08-15 NOTE — IPOC Note (Signed)
Overall Plan of Care [facility]) Patient Details Name: Jacqueline Leach MRN: [id number] DOB: [birth year]  Admitting Diagnosis: <principal problem not specified>  Hospital Problems: Active Problems:   Left pontine cerebrovascular accident [facility])     Functional Problem List: Nursing Pain, Bladder, Bowel, Edema, Skin Integrity, Medication Management  PT Balance, Endurance, Motor, Pain, Safety  OT Balance, Endurance, Motor, Safety  SLP    TR         Basic ADL's: OT Grooming, Bathing, Dressing, Toileting     Advanced  ADL's: OT (none)     Transfers: PT Bed Mobility, Bed to Chair, Car, Manufacturing systems engineer, Metallurgist: PT Ambulation, Emergency planning/management officer, Stairs     Additional Impairments: OT Fuctional Use of Upper Extremity  SLP        TR      Anticipated Outcomes Item Anticipated Outcome  Self Feeding n/a  Swallowing      Basic self-care  S  Toileting  S   Bathroom Transfers S  Bowel/Bladder  to remain continent of bowel and bladder; LBM 08/12/19  Transfers  supervision with LRAD  Locomotion  supervision with LRAD except min assist 3 steps without rails with LRAD  Communication     Cognition     Pain  less than 2  Safety/Judgment  to remain free from falls while in rehab   Therapy Plan: PT Intensity: Minimum of 1-2 x/day ,45 to 90 minutes PT Frequency: 5 out of 7 days PT Duration Estimated Length of Stay: 2 weeks OT Intensity: Minimum of 1-2 x/day, 45 to 90 minutes OT Frequency: 5 out of 7 days OT Duration/Estimated Length of Stay: 14-17 days     Due to the current state of emergency, patients may not be receiving their 3-hours of Medicare-mandated therapy.   Team Interventions: Nursing Interventions Disease Management/Prevention, Skin Care/Wound Management, Discharge Planning, Bladder Management, Bowel Management, Medication Management  PT interventions Ambulation/gait training, Cognitive remediation/compensation, Discharge planning,  DME/adaptive equipment instruction, Functional mobility training, Pain management, Psychosocial support, Splinting/orthotics, Therapeutic Activities, UE/LE Strength taining/ROM, Wheelchair propulsion/positioning, UE/LE Coordination activities, Therapeutic Exercise, Skin care/wound management, Stair training, Patient/family education, Neuromuscular re-education, Functional electrical stimulation, Community reintegration, Training and development officer, Disease management/prevention  OT Interventions Training and development officer, Functional electrical stimulation, Self Care/advanced ADL retraining, UE/LE Coordination activities, Cognitive remediation/compensation, Functional mobility training, Skin care/wound managment, Community reintegration, Neuromuscular re-education, Wheelchair propulsion/positioning, Discharge planning, Pain management, Therapeutic Activities, Patient/family education, Therapeutic Exercise, DME/adaptive equipment instruction, Psychosocial support, UE/LE Strength taining/ROM  SLP Interventions    TR Interventions    SW/CM Interventions Discharge Planning, Psychosocial Support, Patient/Family Education   Barriers to Discharge MD  Medical stability and nausea and vomiting med related  Nursing New diabetic, Medication compliance    PT Home environment access/layout 3 steps without rails to enter home  OT (none known at this time)    SLP      SW Other (comments) [employer] was not seeing a MD prior to admission   Team Discharge Planning: Destination: PT-Home ,OT- Home , SLP-  Projected Follow-up: PT-24 hour supervision/assistance(HHPT vs OPPT), OT-  Home health OT, 24 hour supervision/assistance, SLP-  Projected Equipment Needs: PT-Rolling walker with 5" wheels, OT- To be determined, SLP-  Equipment Details: PT-TBD on w/c for community use, OT-  Patient/family involved in discharge planning: PT- Patient,  OT-Patient, SLP-   MD ELOS: 18-21d Medical Rehab Prognosis:   Excellent Assessment:   63 year old right-handed female with history of diabetes mellitus noncompliant with medications, hypertension on no antihypertensive  medications.  Per chart review she lives with her children and assistance as needed.  Reportedly independent prior to admission driving as well as independent ADLs.  She is a retired Merchandiser, retail.  1 level home 3 steps to entry.  Presented 08/09/2019 from [facility] with sudden onset of right side weakness resulting in a fall and slurred speech as well as hematoma right leg.  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  Remote lacunar infarct in the right cerebral white matter.  No hemorrhage or hydrocephalus noted.  TPA was initiated but stopped due to some hematemesis and hematoma right thigh.  Echocardiogram with ejection fraction 65% without emboli.  Carotid Dopplers unremarkable.  CT of head outside hospital showed no large vessel occlusion.  Admission chemistries with SARS coronavirus negative, hemoglobin A1c 11.8, WBC 10,900.  Neurology follow-up maintained on aspirin and Plavix for CVA prophylaxis x3 weeks then aspirin alone.  Subcutaneous Lovenox for DVT prophylaxis.  Tolerating a regular diet   Now requiring 24/7 Rehab RN,MD, as well as CIR level PT, OT and SLP.  Treatment team will focus on ADLs and mobility with goals set at Supervision  See Team Conference Notes for weekly updates to the plan of care

## 2019-08-15 NOTE — Progress Notes (Signed)
Physical Therapy Session Note  Patient Details  Name: Jacqueline Leach MRN: [id number] Date of Birth: 07-07-1956  Today's Date: 08/15/2019 PT Individual Time: 1102-1200 1605-1700 PT Individual Time Calculation (min): 58 min and 55 min   Short Term Goals: Week 1:  PT Short Term Goal 1 (Week 1): Pt will negotiate 3 steps with LRAD & mod assist. PT Short Term Goal 2 (Week 1): Pt will ambulate 75 ft with LRAD & min assist. PT Short Term Goal 3 (Week 1): Pt will complete bed mobility consistently with min assist. PT Short Term Goal 4 (Week 1): Pt will complete bed<>w/c transfers consistently with CGA.  Skilled Therapeutic Interventions/Progress Updates:  Session 1. Pt received sitting in WC and agreeable to PT. Pt performed WC mobility through hall using hemi technique and supervision assist for improved safety with turns and doorway management.   PT instructed in in NMR with pre-gait and dynamic gait re-training in parallel bars and with RW. Reciprocal foot taps to target 2 x 5 BLE. Assisted hip.knee flexion in the RLE 2 x 10 with moderate cues and facilitation to activate hip flexion and knee flexion. Forward/backward gait in parallel bars 2 x 37f with mod assist to advance in the RLE and to maintain wide BOS. PT applied ace wrap for ankle DF on the the RLE. Additional forward/backward gait trainiing with min assist for posture and control of the RLE. Noted improvement in limb advancement and knee stability with DF wrap. Gait training with RW and DF wrap x 156f 3034fnd 61f11fth mod progressing to min assist and moderate cues for sequencing, posture, and step length in the R.   Patient returned to room and [facility] reach and all needs met.     Session 2.   Pt received sitting in WC and agreeable to PT. WC mobility x [id number] supervision assist and hemi technique. Min cues for L turning technique.   Squat pivot transfer to [facility] min assist overall from  PT with min-mod cues for LE placement and improved trunk control.   nustep reciprocal movement training, BLE only x 5 min with min assist from PT for initiation of movement on the L with fatigue. BUE and BLE x 5 min after short rest break. Cues for improved knee control and attention to the RUE to improve activation of shoulder musculature.   PT applied DF wrap to the RLE. Gait training with RW 2 x 50ft 72f mod assist from PT and moderate cues for posture, improved step height, weight shift L and decreased speed to improve safety.    Seated knee flexion/extension with towel under the foot to decrease friction. 2 x 12 BLE with AAROM on the R to allow full ROM into flexion.   Patient returned to room and left sitting in [facility] in reach and all needs met.         Therapy Documentation Precautions:  Precautions Precautions: Fall Precaution Comments: R hemi Restrictions Weight Bearing Restrictions: No Pain: denies Therapy/Group: Individual Therapy  AustinLorie Phenix2020, 12:03 PM

## 2019-08-15 NOTE — Progress Notes (Signed)
Occupational Therapy Session Note  Patient Details  Name: Jacqueline Leach MRN: [id number] Date of Birth: 1956/01/04  Today's Date: 08/15/2019 OT Individual Time: 0850-1015 OT Individual Time Calculation (min): 85 min    Short Term Goals: Week 1:  OT Short Term Goal 1 (Week 1): Pt will perform shower transfer with min A overall. OT Short Term Goal 2 (Week 1): Pt will perform toileting with min A overall. OT Short Term Goal 3 (Week 1): Pt will perform UB dressing with min A overall. OT Short Term Goal 4 (Week 1): Pt will perform LB dressing with min A overall.  Skilled Therapeutic Interventions/Progress Updates:    Upon entering the room, pt seated in wheelchair with NT within the room and pt requesting to use bathroom. OT assisted pt to bathroom via wheelchair and pt transferred with mod squat pivot transfer. Pt needing min - mod A standing balance while pushing clothing items down. Pt able to void and perform hygiene while seated. Pt standing and ambulating with max A 6' to TTB with assist from therapist for weight shift , blocking R knee, and advancement of R LE. Pt seated on TTB for bathing tasks with supervision for balance and hand over hand assistance to utilize R UE in functional tasks. Pt returning to wheelchair and engaged in dressing tasks with sit <>stand at sink and focus on hemiplegic dressing technique. Pt required assistance to be placed into figure four position and to hold so she could thread clothing onto feet. Pt able to pull pants over B hips in standing with mod A for unsupported standing balance. OT provided assistance to don B TED hose and shoes this session. OT assisted pt to day room for weight bearing tasks while standing at high low table. Pt standing for 13 minutes and needing R knee blocked and able to reach in various directions on table to obtain playing cards without more than min A for standing balance. Pt returned to wheelchair and propelled self with hemiplegic  technique and supervision to room. Call bell and all needed items within reach upon exiting the room.   Therapy Documentation Precautions:  Precautions Precautions: Fall Precaution Comments: R hemi Restrictions Weight Bearing Restrictions: No Pain: Pain Assessment Pain Scale: 0-10 Pain Score: 0-No pain   Therapy/Group: Individual Therapy    Gypsy Decant 08/15/2019, 1:08 PM

## 2019-08-15 NOTE — Progress Notes (Addendum)
Social Work Patient ID: Jacqueline Leach, female   DOB: 1955/10/16, 63 y.o.   MRN: [id number]  Met with pt who reports having a much better day today and had slept better. She is feeling better about her progress and her situation. Started off bad and with many issues which now have resolved. Will ask neuro-psych to see on Monday. Pt feels this will be helpful.

## 2019-08-16 ENCOUNTER — Inpatient Hospital Stay (HOSPITAL_COMMUNITY): Payer: Self-pay | Admitting: Occupational Therapy

## 2019-08-16 ENCOUNTER — Inpatient Hospital Stay (HOSPITAL_COMMUNITY): Payer: Self-pay | Admitting: Physical Therapy

## 2019-08-16 LAB — GLUCOSE, CAPILLARY
Glucose-Capillary: 171 mg/dL — ABNORMAL HIGH (ref 70–99)
Glucose-Capillary: 241 mg/dL — ABNORMAL HIGH (ref 70–99)
Glucose-Capillary: 222 mg/dL — ABNORMAL HIGH (ref 70–99)
Glucose-Capillary: 223 mg/dL — ABNORMAL HIGH (ref 70–99)

## 2019-08-16 NOTE — Progress Notes (Signed)
Occupational Therapy Session Note  Patient Details  Name: Jacqueline Leach MRN: [id number] Date of Birth: 1956-03-02  Today's Date: 08/16/2019 OT Individual Time: 2536-6440 and 1420-1531 OT Individual Time Calculation (min): 72 min and 71 min  Short Term Goals: Week 1:  OT Short Term Goal 1 (Week 1): Pt will perform shower transfer with min Jacqueline overall. OT Short Term Goal 2 (Week 1): Pt will perform toileting with min Jacqueline overall. OT Short Term Goal 3 (Week 1): Pt will perform UB dressing with min Jacqueline overall. OT Short Term Goal 4 (Week 1): Pt will perform LB dressing with min Jacqueline overall.  Skilled Therapeutic Interventions/Progress Updates:    Pt greeted in bed with no c/o pain. Requesting to shower. Supervision for supine<sit with HOB elevated, Mod Jacqueline for squat pivot<w/c. She completed toileting, showering, dressing, and oral care/grooming tasks during session. All functional transfers completed either Mod Jacqueline squat pivot or Mod Jacqueline stand pivot/short distance ambulation using grab bar. Mod Jacqueline for balance during all dynamic standing activity, steady assist-close supervision for dynamic sitting tasks. She needed assist to place R LE into figure 4 position to wash and dress affected side. HOH for using R UE as gross assist and gross stabilizer as needed. Pt exhibits some proximal activation in shoulder but no active movement noted distally. Worked on joint protection strategies while standing at the sink to complete hand washing. Other grooming activities/oral care completed while seated using one handed techniques she had learned. At end of session pt used RW to ambulate to the doorway and back to w/c parked beside sink. Pt needed Max Jacqueline for weight shifting towards Lt and advancing R LE, especially during turns. She wanted to remain in the w/c, left her with all needs within reach, half lap tray, and safety belt fastened.   2nd Session 1:1 tx (71 min) Pt greeted in w/c with no c/o pain. ADL needs met. Escorted pt  to the dayroom via w/c. Worked on standing balance, Rt knee stability, and Rt sided weightbearing while pt read her mystery book at the elevated table. Pt stabilized the book using her Rt hand and also turned pages with active assist. Provided min facilitation for increasing weightbearing on Rt side. With verbal and tactile cuing pt able to maintain control of Rt knee with Min-CGA for ~5 minute intervals. Longest standing window without rest 9 minutes. Sit<stand x3 completed with steady assist. While seated, guided pt through active assist exercises using the UE ranger. Focus at this time placed on developing proximal strength. Mod facilitation for full extension of elbow and full flexion of elbow. She also began moving her fingers and exhibited grasp/release abilities during session! We celebrated! Provided her with Jacqueline yellow foam sponge to continue practicing grasp/release. Pt was then escorted back to the room and left with all needs within reach, half lap tray, and safety belt.      Therapy Documentation Precautions:  Precautions Precautions: Fall Precaution Comments: R hemi Restrictions Weight Bearing Restrictions: No ADL:       Therapy/Group: Individual Therapy  Jacqueline Leach Jacqueline Leach 08/16/2019, 12:07 PM

## 2019-08-16 NOTE — Plan of Care (Signed)
  Problem: Consults Goal: RH STROKE PATIENT EDUCATION Description: See Patient Education module for education specifics  Outcome: Progressing Goal: Diabetes Guidelines if Diabetic/Glucose > 140 Description: If diabetic or lab glucose is > 140 mg/dl - Initiate Diabetes/Hyperglycemia Guidelines & Document Interventions  Outcome: Progressing   Problem: RH BOWEL ELIMINATION Goal: RH STG MANAGE BOWEL WITH ASSISTANCE Description: STG Manage Bowel with mod I Assistance. Outcome: Progressing Goal: RH STG MANAGE BOWEL W/MEDICATION W/ASSISTANCE Description: STG Manage Bowel with Medication with mod I Assistance. Outcome: Progressing   Problem: RH BLADDER ELIMINATION Goal: RH STG MANAGE BLADDER WITH ASSISTANCE Description: STG Manage Bladder With mod I Assistance Outcome: Progressing   Problem: RH SKIN INTEGRITY Goal: RH STG SKIN FREE OF INFECTION/BREAKDOWN Description: Patient will have no acquired break down while on IP Rehab Outcome: Progressing Goal: RH STG MAINTAIN SKIN INTEGRITY WITH ASSISTANCE Description: STG Maintain Skin Integrity With mod I Assistance. Outcome: Progressing   Problem: RH PAIN MANAGEMENT Goal: RH STG PAIN MANAGED AT OR BELOW PT'S PAIN GOAL Description: Pain scale <2/10 Outcome: Progressing   Problem: RH KNOWLEDGE DEFICIT Goal: RH STG INCREASE KNOWLEDGE OF DIABETES Description: Patient will be able to verbalize the importance of good diabetes management Outcome: Progressing Goal: RH STG INCREASE KNOWLEDGE OF HYPERTENSION Description: Patient will be able to verbalize the risks of hypertension Outcome: Progressing Goal: RH STG INCREASE KNOWLEDGE OF STROKE PROPHYLAXIS Description: Patient will be able to verbalize her stoke prophylaxis medication Outcome: Progressing

## 2019-08-16 NOTE — Progress Notes (Signed)
Physical Therapy Session Note  Patient Details  Name: Jacqueline Leach MRN: [id number] Date of Birth: 1956-04-15  Today's Date: 08/16/2019 PT Individual Time: 4388-8757 PT Individual Time Calculation (min): 73 min   Short Term Goals: Week 1:  PT Short Term Goal 1 (Week 1): Pt will negotiate 3 steps with LRAD & mod assist. PT Short Term Goal 2 (Week 1): Pt will ambulate 75 ft with LRAD & min assist. PT Short Term Goal 3 (Week 1): Pt will complete bed mobility consistently with min assist. PT Short Term Goal 4 (Week 1): Pt will complete bed<>w/c transfers consistently with CGA.  Skilled Therapeutic Interventions/Progress Updates:   Pt received sitting in WC and agreeable to PT. PT instructed pt WC mobility x 142f with supervision assist and min cues for safety in turns and awareness of obstacles on the R.   Tall kneeling on mat table with mod assist to force improved hip extension, upright posture, and weight bearing through the RUE. In tall kneeling, performed lateral reach to the R and L with the LUE to place and remove pegs from peg board puzzle.   Squat pivot transfer to mat table with min assist and min cues for proper UE placement.  Sit>supine with min assist for RLE control.  Supine NMR:  SAQ x 10 BLE with AAROM progressing to AROM on the LLE.  Bridges x 10.  Rolling R and L with CGA to guide RUE x 5 bil.  PNF d1 and D2 for BLE x 12 each, with manual resistance as tolerated.  Side lying RLE hip and knee flexion in gravity eliminated position x 25. Able to perform with mild resistance from PT in extension>flexion  Seated RUE/trunk NMR:  Lateral lean onto elbow with PT to stabilize the hand. 2 x 10 with min assist and min cues for decreased compensations through the RLE.  Reciprocal scooting to EOB with supervision assist and min cues for improved R lateral lean to bring L hip forward. X 5 Bil RUE Shoulder press/retraction with AAROM from PT for full ROM x 15. R elbow flexion x 15  with AAROM from PT.    Gait training with RW x 132fwith min assist and R DF wrap. Additional gait training x 6055fith min fading to mod assist fatigue. Moderate cues for posture, step length, and sequencing to improve AD management.   Patient returned to room and [facility] and all needs met.         Therapy Documentation Precautions:  Precautions Precautions: Fall Precaution Comments: R hemi Restrictions Weight Bearing Restrictions: No    Vital Signs: Therapy Vitals Temp: 98.7 F (37.1 C) Pulse Rate: 89 Resp: 16 BP: 130/71 Patient Position (if appropriate): Sitting Oxygen Therapy SpO2: 98 % O2 Device: Room Air Pain:   denies   Therapy/Group: Individual Therapy  AusLorie Phenix/12/2018, 4:47 PM

## 2019-08-16 NOTE — Progress Notes (Signed)
[place] PHYSICAL MEDICINE & REHABILITATION PROGRESS NOTE   Subjective/Complaints:  No issues overnite  Slept well   ROS- denies CP, SOB, no bowel or bladder issues    Objective:   No results found. No results for input(s): WBC, HGB, HCT, PLT in the last 72 hours. No results for input(s): NA, K, CL, CO2, GLUCOSE, BUN, CREATININE, CALCIUM in the last 72 hours.  Intake/Output Summary (Last 24 hours) at 08/16/2019 0831 Last data filed at 08/15/2019 1815 Gross per 24 hour  Intake 600 ml  Output -  Net 600 ml     Physical Exam: Vital Signs Blood pressure (!) 148/72, pulse 84, temperature 98.5 F (36.9 C), resp. rate 18, height 5\' 3"  (1.6 m), weight 76.2 kg, SpO2 97 %.   General: No acute distress Mood and affect are appropriate Heart: Regular rate and rhythm no rubs murmurs or extra sounds Lungs: Clear to auscultation, breathing unlabored, no rales or wheezes Abdomen: Positive bowel sounds, soft nontender to palpation, nondistended Extremities: No clubbing, cyanosis, or edema Skin: No evidence of breakdown, no evidence of rash Neurologic: Cranial nerves II through XII intact, motor strength is 5/5 in LEFT  deltoid, bicep, tricep, grip, hip flexor, knee extensors, ankle dorsiflexor and plantar flexor 2- RIght biceps and delt, 3- Right Knee ext , 0/5 in Right ankle DF/PF and HF  Sensory exam normal sensation to light touch and proprioception in bilateral upper and lower extremities  Musculoskeletal: Full range of motion in all 4 extremities. No joint swelling   Assessment/Plan: 1. 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  Physiatrist is providing close team supervision and 24 hour management of active medical problems listed below.  Physiatrist and rehab team continue to assess barriers to discharge/monitor patient progress toward functional and medical  goals  Care Tool:  Bathing    Body parts bathed by patient: Right arm, Chest, Abdomen, Right upper leg, Left upper leg, Face, Front perineal area   Body parts bathed by helper: Left arm, Buttocks, Right lower leg, Left lower leg     Bathing assist Assist Level: Moderate Assistance - Patient 50 - 74%     Upper Body Dressing/Undressing Upper body dressing   What is the patient wearing?: Pull over shirt    Upper body assist Assist Level: Minimal Assistance - Patient > 75%    Lower Body Dressing/Undressing Lower body dressing      What is the patient wearing?: Underwear/pull up, Pants     Lower body assist Assist for lower body dressing: Moderate Assistance - Patient 50 - 74%     Toileting Toileting    Toileting assist Assist for toileting: Moderate Assistance - Patient 50 - 74%     Transfers Chair/bed transfer  Transfers assist  Chair/bed transfer activity did not occur: Safety/medical concerns  Chair/bed transfer assist level: Moderate Assistance - Patient 50 - 74%     Locomotion Ambulation   Ambulation assist   Ambulation activity did not occur: Safety/medical concerns          Walk 10 feet activity   Assist  Walk 10 feet activity did not occur: Safety/medical concerns        Walk 50 feet activity   Assist Walk 50 feet with 2 turns activity did not occur: Safety/medical concerns         Walk 150 feet activity   Assist Walk 150 feet activity did not occur:  Safety/medical concerns         Walk 10 feet on uneven surface  activity   Assist Walk 10 feet on uneven surfaces activity did not occur: Safety/medical Armed forces technical officer activity did not occur: Safety/medical concerns         Wheelchair 50 feet with 2 turns activity    Assist    Wheelchair 50 feet with 2 turns activity did not occur: Safety/medical concerns       Wheelchair 150 feet activity     Assist   Wheelchair 150 feet activity did not occur: Safety/medical concerns       Blood pressure (!) 148/72, pulse 84, temperature 98.5 F (36.9 C), resp. rate 18, height 5\' 3"  (1.6 m), weight 76.2 kg, SpO2 97 %.  Medical Problem List and Plan: 1.  Right side weakness and slurred speech secondary to left pontine acute perforator infarction             -patient may shower             -ELOS/Goals: modI in PT, MinA in OT, I in SLP  2.  Antithrombotics: -DVT/anticoagulation: Lovenox             -antiplatelet therapy: Aspirin and Plavix x3 weeks then aspirin alone 3. Pain Management: Tylenol as needed 4. Mood: Provide emotional support             -antipsychotic agents: N/A 5. Neuropsych: This patient is capable of making decisions on her own behalf. 6. Skin/Wound Care: Routine skin checks 7. Fluids/Electrolytes/Nutrition: Routine in and outs with follow-up chemistries 8.  Diabetes mellitus.  Hemoglobin A1c 11.8.  non compliant with meds at home   Diabetic teaching CBG (last 3)  Recent Labs    08/15/19 1725 08/15/19 2104 08/16/19 0613  GLUCAP 209* 171* 171*  off metformin Lantus to 18U -CBG running a little hi increase lantus to 20U  9.  Hyperlipidemia.  Lipitor. 10. HTN: She was on no home medications, but is currently hypertensive. Neurology recommends permissive HTN for 1 week, in 2nd week of rehab will add hypertensive agent if BP still elevated.  Vitals:   08/15/19 1402 08/15/19 2023  BP: 139/76 (!) 148/72  Pulse: 90 84  Resp: 17 18  Temp: 98.6 F (37 C) 98.5 F (36.9 C)  SpO2: 97% 97%  BP well controlled  12/4 10. Education: Will require stroke prevention education and education regarding the importance of good control of her HTN and DM. She was noncompliant with DM medications at home and continues to be hypertensive   11.  Insomnia schedule trazodone  LOS: 4 days A FACE TO FACE EVALUATION WAS PERFORMED  Charlett Blake 08/16/2019, 8:31 AM

## 2019-08-17 ENCOUNTER — Inpatient Hospital Stay (HOSPITAL_COMMUNITY): Payer: Self-pay | Admitting: Physical Therapy

## 2019-08-17 ENCOUNTER — Inpatient Hospital Stay (HOSPITAL_COMMUNITY): Payer: Self-pay | Admitting: Occupational Therapy

## 2019-08-17 LAB — GLUCOSE, CAPILLARY
Glucose-Capillary: 215 mg/dL — ABNORMAL HIGH (ref 70–99)
Glucose-Capillary: 239 mg/dL — ABNORMAL HIGH (ref 70–99)
Glucose-Capillary: 327 mg/dL — ABNORMAL HIGH (ref 70–99)
Glucose-Capillary: 208 mg/dL — ABNORMAL HIGH (ref 70–99)

## 2019-08-17 MED ORDER — INSULIN GLARGINE 100 UNIT/ML ~~LOC~~ SOLN
20.0000 [IU] | Freq: Every day | SUBCUTANEOUS | Status: DC
  Administered 2019-08-18 – 2019-08-20 (×3): 20 [IU] via SUBCUTANEOUS
  Filled 2019-08-17 (×3): qty 0.2

## 2019-08-17 NOTE — Plan of Care (Signed)
  Problem: Consults Goal: RH STROKE PATIENT EDUCATION Description: See Patient Education module for education specifics  Outcome: Progressing Goal: Diabetes Guidelines if Diabetic/Glucose > 140 Description: If diabetic or lab glucose is > 140 mg/dl - Initiate Diabetes/Hyperglycemia Guidelines & Document Interventions  Outcome: Progressing   Problem: RH BOWEL ELIMINATION Goal: RH STG MANAGE BOWEL WITH ASSISTANCE Description: STG Manage Bowel with mod I Assistance. Outcome: Progressing Goal: RH STG MANAGE BOWEL W/MEDICATION W/ASSISTANCE Description: STG Manage Bowel with Medication with mod I Assistance. Outcome: Progressing   Problem: RH BLADDER ELIMINATION Goal: RH STG MANAGE BLADDER WITH ASSISTANCE Description: STG Manage Bladder With mod I Assistance Outcome: Progressing   Problem: RH SKIN INTEGRITY Goal: RH STG SKIN FREE OF INFECTION/BREAKDOWN Description: Patient will have no acquired break down while on IP Rehab Outcome: Progressing Goal: RH STG MAINTAIN SKIN INTEGRITY WITH ASSISTANCE Description: STG Maintain Skin Integrity With mod I Assistance. Outcome: Progressing   Problem: RH PAIN MANAGEMENT Goal: RH STG PAIN MANAGED AT OR BELOW PT'S PAIN GOAL Description: Pain scale <2/10 Outcome: Progressing   Problem: RH KNOWLEDGE DEFICIT Goal: RH STG INCREASE KNOWLEDGE OF DIABETES Description: Patient will be able to verbalize the importance of good diabetes management Outcome: Progressing Goal: RH STG INCREASE KNOWLEDGE OF HYPERTENSION Description: Patient will be able to verbalize the risks of hypertension Outcome: Progressing Goal: RH STG INCREASE KNOWLEDGE OF STROKE PROPHYLAXIS Description: Patient will be able to verbalize her stoke prophylaxis medication Outcome: Progressing

## 2019-08-17 NOTE — Progress Notes (Signed)
[place] PHYSICAL MEDICINE & REHABILITATION PROGRESS NOTE   Subjective/Complaints:  Discussed BP and CBG with Pt   ROS- denies CP, SOB, no bowel or bladder issues    Objective:   No results found. No results for input(s): WBC, HGB, HCT, PLT in the last 72 hours. No results for input(s): NA, K, CL, CO2, GLUCOSE, BUN, CREATININE, CALCIUM in the last 72 hours.  Intake/Output Summary (Last 24 hours) at 08/17/2019 0958 Last data filed at 08/16/2019 2300 Gross per 24 hour  Intake 600 ml  Output -  Net 600 ml     Physical Exam: Vital Signs Blood pressure 120/74, pulse 70, temperature 98.3 F (36.8 C), resp. rate 19, height 5\' 3"  (1.6 m), weight 76.2 kg, SpO2 95 %.   General: No acute distress Mood and affect are appropriate Heart: Regular rate and rhythm no rubs murmurs or extra sounds Lungs: Clear to auscultation, breathing unlabored, no rales or wheezes Abdomen: Positive bowel sounds, soft nontender to palpation, nondistended Extremities: No clubbing, cyanosis, or edema Skin: No evidence of breakdown, no evidence of rash Neurologic: Cranial nerves II through XII intact, motor strength is 5/5 in LEFT  deltoid, bicep, tricep, grip, hip flexor, knee extensors, ankle dorsiflexor and plantar flexor 2- RIght biceps and delt, 3- Right Knee ext , 0/5 in Right ankle DF/PF and HF  Sensory exam normal sensation to light touch and proprioception in bilateral upper and lower extremities  Musculoskeletal: Full range of motion in all 4 extremities. No joint swelling   Assessment/Plan: 1. 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  Physiatrist is providing close team supervision and 24 hour management of active medical problems listed below.  Physiatrist and rehab team continue to assess barriers to discharge/monitor patient progress toward functional and medical  goals  Care Tool:  Bathing    Body parts bathed by patient: Right arm, Chest, Abdomen, Right upper leg, Left upper leg, Face, Front perineal area, Buttocks   Body parts bathed by helper: Right lower leg, Left lower leg     Bathing assist Assist Level: Moderate Assistance - Patient 50 - 74%     Upper Body Dressing/Undressing Upper body dressing   What is the patient wearing?: Pull over shirt    Upper body assist Assist Level: Minimal Assistance - Patient > 75%    Lower Body Dressing/Undressing Lower body dressing      What is the patient wearing?: Underwear/pull up, Pants     Lower body assist Assist for lower body dressing: Moderate Assistance - Patient 50 - 74%     Toileting Toileting    Toileting assist Assist for toileting: Moderate Assistance - Patient 50 - 74%     Transfers Chair/bed transfer  Transfers assist  Chair/bed transfer activity did not occur: Safety/medical concerns  Chair/bed transfer assist level: Minimal Assistance - Patient > 75%     Locomotion Ambulation   Ambulation assist   Ambulation activity did not occur: Safety/medical concerns  Assist level: Moderate Assistance - Patient 50 - 74%   Max distance: 60   Walk 10 feet activity   Assist  Walk 10 feet activity did not occur: Safety/medical concerns  Assist level: Minimal Assistance - Patient > 75% Assistive device: Walker-rolling   Walk 50 feet activity   Assist Walk 50 feet with 2 turns activity did not occur: Safety/medical concerns  Assist level: Moderate Assistance - Patient - 50 - 74%  Assistive device: Walker-rolling    Walk 150 feet activity   Assist Walk 150 feet activity did not occur: Safety/medical concerns         Walk 10 feet on uneven surface  activity   Assist Walk 10 feet on uneven surfaces activity did not occur: Safety/medical concerns         Wheelchair     Assist   Type of Wheelchair: Manual Wheelchair activity did not occur:  Safety/medical concerns  Wheelchair assist level: Supervision/Verbal cueing Max wheelchair distance: 150    Wheelchair 50 feet with 2 turns activity    Assist    Wheelchair 50 feet with 2 turns activity did not occur: Safety/medical concerns   Assist Level: Supervision/Verbal cueing   Wheelchair 150 feet activity     Assist  Wheelchair 150 feet activity did not occur: Safety/medical concerns   Assist Level: Supervision/Verbal cueing   Blood pressure 120/74, pulse 70, temperature 98.3 F (36.8 C), resp. rate 19, height 5\' 3"  (1.6 m), weight 76.2 kg, SpO2 95 %.  Medical Problem List and Plan: 1.  Right side weakness and slurred speech secondary to left pontine acute perforator infarction             -patient may shower             -ELOS/Goals: modI in PT, MinA in OT, I in SLP  2.  Antithrombotics: -DVT/anticoagulation: Lovenox             -antiplatelet therapy: Aspirin and Plavix x3 weeks then aspirin alone 3. Pain Management: Tylenol as needed 4. Mood: Provide emotional support             -antipsychotic agents: N/A 5. Neuropsych: This patient is capable of making decisions on her own behalf. 6. Skin/Wound Care: Routine skin checks 7. Fluids/Electrolytes/Nutrition: Routine in and outs with follow-up chemistries 8.  Diabetes mellitus.  Hemoglobin A1c 11.8.  non compliant with meds at home   Diabetic teaching CBG (last 3)  Recent Labs    08/16/19 1703 08/16/19 2108 08/17/19 0618  GLUCAP 222* 223* 215*  CBG running a little hi increase lantus to 20U  9.  Hyperlipidemia.  Lipitor. 10. HTN: She was on no home medications, but is currently hypertensive. Neurology recommends permissive HTN for 1 week, in 2nd week of rehab will add hypertensive agent if BP still elevated.  Vitals:   08/17/19 0303 08/17/19 0619  BP: (!) 133/59 120/74  Pulse: 78 70  Resp: 19 19  Temp: 98.1 F (36.7 C) 98.3 F (36.8 C)  SpO2: 95%   BP well controlled  12/5 10. Education: Will  require stroke prevention education and education regarding the importance of good control of her HTN and DM. She was noncompliant with DM medications at home and continues to be hypertensive   11.  Insomnia schedule trazodone  LOS: 5 days A FACE TO FACE EVALUATION WAS PERFORMED  Charlett Blake 08/17/2019, 9:58 AM

## 2019-08-17 NOTE — Progress Notes (Signed)
Physical Therapy Session Note  Patient Details  Name: Jacqueline Leach MRN: [id number] Date of Birth: 1955-10-02  Today's Date: 08/17/2019  PT Individual Time: [phone] [id number] PT Individual Time Calculation (min): 56 min and 55 min   Short Term Goals: Week 1:  PT Short Term Goal 1 (Week 1): Pt will negotiate 3 steps with LRAD & mod assist. PT Short Term Goal 2 (Week 1): Pt will ambulate 75 ft with LRAD & min assist. PT Short Term Goal 3 (Week 1): Pt will complete bed mobility consistently with min assist. PT Short Term Goal 4 (Week 1): Pt will complete bed<>w/c transfers consistently with CGA.  Skilled Therapeutic Interventions/Progress Updates:  Session 1 Pt received sitting in WC and agreeable to PT. Pt transported to rehab gym in [facility]. PT applied DF wrap to the RLE. pregait stepping tasks to emphasize use of hip flexion to tap on target, as well as stepping over hockey stick on floor. Min-mod assist for increased sequecning of muscle activation and posture.  3" step x 5 with LUE ascend and RUE descent and 6" step x 1. Mod assist overall with moderate cues for posture, step to gait pattern, and improved terminal knee extension to prevent buckling on the RLE.   Gait training with RW, DF wrap and R hand orthotic 2x 7f, min assist overall to improve pelvic R rotation and cues for improved heel strike to encourage hip/knee flexion in swing on the R.   [facility] and all needs met.    Session 2.   Pt received sitting in WC and agreeable to PT  Seated RUE NMR with AAROM and mild resistance as tolerated. Elbow flexion/extension. Finger flexion/extension. Wrist supination/pronation, and shoulder flexion/elbow extension. Each completed x 1.5 min with cues for decreased upper trap compensations. significant improvement all motions since eval   Gait training with RW, DF wrap and hand orthotic x [id number] min assist for posture as  well as cues for improved hip flexor activation and decreased compensation through circumduction. Side stepping at rail in hall 2 x [id number] with min assist and moderate cues for sequencing, attention to RUE and improved step height. Forward/retrograde gait training with RW 2 x [id number].   nustep reciprocal movement training x 8 min with BUE and BLE. Min cues for symmetry and improved attention to R hip control intermittently.  Patient returned to room and [facility] and all needs met.        Therapy Documentation Precautions:  Precautions Precautions: Fall Precaution Comments: R hemi Restrictions Weight Bearing Restrictions: No Vital Signs: Therapy Vitals Temp: 98.3 F (36.8 C) Pulse Rate: 70 Resp: 19 BP: 120/74 Patient Position (if appropriate): Lying Oxygen Therapy O2 Device: Room Air Pain: Pain Assessment Pain Scale: 0-10 Pain Score: 5  Pain Type: Acute pain Pain Location: Generalized Pain Descriptors / Indicators: Aching Pain Onset: Gradual Patients Stated Pain Goal: 0 Pain Intervention(s): Medication (See eMAR)    Therapy/Group: Individual Therapy  AustLorie Phenix5/2020, 10:04 AM

## 2019-08-17 NOTE — Progress Notes (Signed)
Occupational Therapy Session Note  Patient Details  Name: Jacqueline Leach MRN: [id number] Date of Birth: 1955/12/11  Today's Date: 08/17/2019 OT Individual Time: 1000-1057 OT Individual Time Calculation (min): 57 min   Short Term Goals: Week 1:  OT Short Term Goal 1 (Week 1): Pt will perform shower transfer with min A overall. OT Short Term Goal 2 (Week 1): Pt will perform toileting with min A overall. OT Short Term Goal 3 (Week 1): Pt will perform UB dressing with min A overall. OT Short Term Goal 4 (Week 1): Pt will perform LB dressing with min A overall.  Skilled Therapeutic Interventions/Progress Updates:    Pt greeted in w/c with no c/o pain. Opting to work on her Rt arm instead of showering today. Printed pt off a HEP and we reviewed ROM exercises together during session. Education emphasis was placed on joint protection, prolonged gentle stretching in end ranges, movement in pain free ranges, and using active assist techniques vs passive assist for NMR. Pt very receptive to education and feedback regarding technique modification. She also performed exercises without back support from w/c as she could tolerate to work on trunk strength. At end of session pt remained in w/c with half lap tray and all needs within reach.   Therapy Documentation Precautions:  Precautions Precautions: Fall Precaution Comments: R hemi Restrictions Weight Bearing Restrictions: No Vital Signs: Therapy Vitals Temp: 97.7 F (36.5 C) Temp Source: Oral Pulse Rate: 80 Resp: 14 BP: (!) 132/59 Patient Position (if appropriate): Sitting Oxygen Therapy SpO2: 98 % O2 Device: Room Air   ADL:      Therapy/Group: Individual Therapy  Jacqueline Leach 08/17/2019, 4:32 PM

## 2019-08-18 ENCOUNTER — Inpatient Hospital Stay (HOSPITAL_COMMUNITY): Payer: Self-pay | Admitting: Occupational Therapy

## 2019-08-18 LAB — GLUCOSE, CAPILLARY
Glucose-Capillary: 178 mg/dL — ABNORMAL HIGH (ref 70–99)
Glucose-Capillary: 194 mg/dL — ABNORMAL HIGH (ref 70–99)
Glucose-Capillary: 178 mg/dL — ABNORMAL HIGH (ref 70–99)
Glucose-Capillary: 161 mg/dL — ABNORMAL HIGH (ref 70–99)

## 2019-08-18 MED ORDER — INSULIN ASPART 100 UNIT/ML ~~LOC~~ SOLN
3.0000 [IU] | Freq: Three times a day (TID) | SUBCUTANEOUS | Status: DC
  Administered 2019-08-18 – 2019-08-24 (×19): 3 [IU] via SUBCUTANEOUS

## 2019-08-18 NOTE — Progress Notes (Addendum)
Occupational Therapy Session Note  Patient Details  Name: Jacqueline Leach MRN: [id number] Date of Birth: 1956-02-28  Today's Date: 08/18/2019 OT Individual Time: [id number] and 1445-1526 OT Individual Time Calculation (min): 58 min and 41 min  Short Term Goals: Week 1:  OT Short Term Goal 1 (Week 1): Pt will perform shower transfer with min A overall. OT Short Term Goal 2 (Week 1): Pt will perform toileting with min A overall. OT Short Term Goal 3 (Week 1): Pt will perform UB dressing with min A overall. OT Short Term Goal 4 (Week 1): Pt will perform LB dressing with min A overall.  Skilled Therapeutic Interventions/Progress Updates:    Pt greeted in w/c with no c/o pain. She has been diligently working on her HEPs and was excited about seeing more movement in her Rt hand today! She requested to shower. Pt completed stand pivot<TTB using grab bar with Min A and min facilitation to maintain Rt hand grip on grab bar. Improved Rt knee control noted during transfer. She bathed at sit<stand level, mindfully incorporating the active assist techniques she's learned for R UE and brainstorming a few techniques of her own. Continued education regarding joint protection as she tends to move her Rt hand by the fingers vs wrist. She was able to complete thorough perihygiene while standing with steady assist for balance. Min A for transfer to w/c where she then proceeded with dressing sit<stand at the sink. Assist needed for obtaining and stabilizing R LE into figure 4 during dressing tasks. She also needed help to grip pants with Rt hand so she could use affected UE to pull them up. While seated, pt reaching for grooming task items with active assist from OT, min-mod facilitation for gripping items with Rt hand. She then stood with steady assist to wash hands, active assist with Rt to reach for soap. Pt able to incorporate both hands with supervision assist, pt pleased that Rt arm was able to actively engage vs  flopping on the sink. At end of session pt remained seated in the w/c with half lap tray and all needs within reach.    2nd Session 1:1 tx (41 min) Pt greeted in recliner and agreeable to extra therapy time. Needing to use the restroom. Stand pivot<w/c<toilet completed with Min A. Pt able to lower her clothing with Min A for balance and assist for hooking Rt thumb to incorporate affected UE. Pt with continent BM. While standing, she was able to thoroughly complete her own hygiene with steady assist and Rt hand on grab bar. We celebrated! Min A for fully elevating pants on Rt side and then she returned to the w/c. Handwashing completed while standing at the sink with pt actively assisting her Rt hand when reaching for soap dispenser. Tactile cues for soft stable Rt knee due to hyperextension at times when standing. Post handwashing, worked on Rt sided weightbearing and Rt knee control during mini squats at the sink. Steady assist for maintaining Rt knee stability. Held squatted position for 3 second holds to build strength in the midrange. Pt completed 5 reps 3 sets. Afterwards she wanted to remain up in the w/c. Set her up to complete towel slide exercises using the half lap tray. Discussed a few more seated NMR activities that she can do to keep working on her Rt hand. Pt left with all needs within reach at session exit.   Therapy Documentation Precautions:  Precautions Precautions: Fall Precaution Comments: R hemi Restrictions Weight Bearing Restrictions: No  Vital Signs: Therapy Vitals Temp: 98.3 F (36.8 C) Pulse Rate: 85 Resp: 19 BP: (!) 145/71 Patient Position (if appropriate): Sitting Oxygen Therapy SpO2: 98 % O2 Device: Room Air Pain: No c/o pain during 2nd tx session    ADL:       Therapy/Group: Individual Therapy  Amylah Will A Jamieson Lisa 08/18/2019, 3:47 PM

## 2019-08-18 NOTE — Progress Notes (Signed)
[place] PHYSICAL MEDICINE & REHABILITATION PROGRESS NOTE   Subjective/Complaints:  Discussed  CBG with Pt , she states daughter will be with her 24/7 and be able to administer insulin   ROS- denies CP, SOB, no bowel or bladder issues    Objective:   No results found. No results for input(s): WBC, HGB, HCT, PLT in the last 72 hours. No results for input(s): NA, K, CL, CO2, GLUCOSE, BUN, CREATININE, CALCIUM in the last 72 hours.  Intake/Output Summary (Last 24 hours) at 08/18/2019 1042 Last data filed at 08/17/2019 2100 Gross per 24 hour  Intake 240 ml  Output -  Net 240 ml     Physical Exam: Vital Signs Blood pressure (!) 145/68, pulse 79, temperature 98.4 F (36.9 C), resp. rate 19, height 5\' 3"  (1.6 m), weight 76.2 kg, SpO2 94 %.   General: No acute distress Mood and affect are appropriate Heart: Regular rate and rhythm no rubs murmurs or extra sounds Lungs: Clear to auscultation, breathing unlabored, no rales or wheezes Abdomen: Positive bowel sounds, soft nontender to palpation, nondistended Extremities: No clubbing, cyanosis, or edema Skin: No evidence of breakdown, no evidence of rash Neurologic: Cranial nerves II through XII intact, motor strength is 5/5 in LEFT  deltoid, bicep, tricep, grip, hip flexor, knee extensors, ankle dorsiflexor and plantar flexor 2- RIght biceps and delt, 3- Right Knee ext , 0/5 in Right ankle DF/PF and HF  Sensory exam normal sensation to light touch and proprioception in bilateral upper and lower extremities  Musculoskeletal: Full range of motion in all 4 extremities. No joint swelling   Assessment/Plan: 1. 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  Physiatrist is providing close team supervision and 24 hour management of active medical problems listed below.  Physiatrist and rehab team continue to assess barriers  to discharge/monitor patient progress toward functional and medical goals  Care Tool:  Bathing    Body parts bathed by patient: Right arm, Chest, Abdomen, Right upper leg, Left upper leg, Face, Front perineal area, Buttocks   Body parts bathed by helper: Right lower leg, Left lower leg     Bathing assist Assist Level: Moderate Assistance - Patient 50 - 74%     Upper Body Dressing/Undressing Upper body dressing   What is the patient wearing?: Pull over shirt    Upper body assist Assist Level: Minimal Assistance - Patient > 75%    Lower Body Dressing/Undressing Lower body dressing      What is the patient wearing?: Underwear/pull up, Pants     Lower body assist Assist for lower body dressing: Moderate Assistance - Patient 50 - 74%     Toileting Toileting    Toileting assist Assist for toileting: Moderate Assistance - Patient 50 - 74%     Transfers Chair/bed transfer  Transfers assist  Chair/bed transfer activity did not occur: Safety/medical concerns  Chair/bed transfer assist level: Minimal Assistance - Patient > 75%     Locomotion Ambulation   Ambulation assist   Ambulation activity did not occur: Safety/medical concerns  Assist level: Minimal Assistance - Patient > 75% Assistive device: Walker-rolling Max distance: 60   Walk 10 feet activity   Assist  Walk 10 feet activity did not occur: Safety/medical concerns  Assist level: Minimal Assistance - Patient > 75% Assistive device: Walker-rolling   Walk 50 feet activity   Assist Walk 50 feet with 2 turns activity did  not occur: Safety/medical concerns  Assist level: Minimal Assistance - Patient > 75% Assistive device: Walker-rolling    Walk 150 feet activity   Assist Walk 150 feet activity did not occur: Safety/medical concerns         Walk 10 feet on uneven surface  activity   Assist Walk 10 feet on uneven surfaces activity did not occur: Safety/medical concerns          Wheelchair     Assist   Type of Wheelchair: Manual Wheelchair activity did not occur: Safety/medical concerns  Wheelchair assist level: Supervision/Verbal cueing Max wheelchair distance: 150    Wheelchair 50 feet with 2 turns activity    Assist    Wheelchair 50 feet with 2 turns activity did not occur: Safety/medical concerns   Assist Level: Supervision/Verbal cueing   Wheelchair 150 feet activity     Assist  Wheelchair 150 feet activity did not occur: Safety/medical concerns   Assist Level: Supervision/Verbal cueing   Blood pressure (!) 145/68, pulse 79, temperature 98.4 F (36.9 C), resp. rate 19, height 5\' 3"  (1.6 m), weight 76.2 kg, SpO2 94 %.  Medical Problem List and Plan: 1.  Right side weakness and slurred speech secondary to left pontine acute perforator infarction             -patient may shower             -ELOS/Goals: modI in PT, MinA in OT, I in SLP  2.  Antithrombotics: -DVT/anticoagulation: Lovenox             -antiplatelet therapy: Aspirin and Plavix x3 weeks then aspirin alone 3. Pain Management: Tylenol as needed 4. Mood: Provide emotional support             -antipsychotic agents: N/A 5. Neuropsych: This patient is capable of making decisions on her own behalf. 6. Skin/Wound Care: Routine skin checks 7. Fluids/Electrolytes/Nutrition: Routine in and outs with follow-up chemistries 8.  Diabetes mellitus.  Hemoglobin A1c 11.8.  non compliant with meds at home   Diabetic teaching CBG (last 3)  Recent Labs    08/17/19 1637 08/17/19 2054 08/18/19 0616  GLUCAP 327* 208* 178*  CBG running a little hi increase lantus to 20U  Elevated PM CBG, no metformin due to intolerance sulfa allergy so no sulfonylureas, add novalog  9.  Hyperlipidemia.  Lipitor. 10. HTN: She was on no home medications, but is currently hypertensive. Neurology recommends permissive HTN for 1 week, in 2nd week of rehab will add hypertensive agent if BP still elevated.   Vitals:   08/17/19 2054 08/18/19 0523  BP: (!) 146/62 (!) 145/68  Pulse: 80 79  Resp: 20 19  Temp: 98.4 F (36.9 C) 98.4 F (36.9 C)  SpO2: 95% 94%  BP fairly well controlled  12/6 10. Education: Will require stroke prevention education and education regarding the importance of good control of her HTN and DM. She was noncompliant with DM medications at home and continues to be hypertensive   11.  Insomnia schedule trazodone  LOS: 6 days A FACE TO FACE EVALUATION WAS PERFORMED  Charlett Blake 08/18/2019, 10:42 AM

## 2019-08-18 NOTE — Plan of Care (Signed)
  Problem: Consults Goal: RH STROKE PATIENT EDUCATION Description: See Patient Education module for education specifics  Outcome: Progressing Goal: Diabetes Guidelines if Diabetic/Glucose > 140 Description: If diabetic or lab glucose is > 140 mg/dl - Initiate Diabetes/Hyperglycemia Guidelines & Document Interventions  Outcome: Progressing   Problem: RH BLADDER ELIMINATION Goal: RH STG MANAGE BLADDER WITH ASSISTANCE Description: STG Manage Bladder With mod I Assistance Outcome: Progressing   Problem: RH SKIN INTEGRITY Goal: RH STG SKIN FREE OF INFECTION/BREAKDOWN Description: Patient will have no acquired break down while on IP Rehab Outcome: Progressing Goal: RH STG MAINTAIN SKIN INTEGRITY WITH ASSISTANCE Description: STG Maintain Skin Integrity With mod I Assistance. Outcome: Progressing   Problem: RH PAIN MANAGEMENT Goal: RH STG PAIN MANAGED AT OR BELOW PT'S PAIN GOAL Description: Pain scale <2/10 Outcome: Progressing   Problem: RH KNOWLEDGE DEFICIT Goal: RH STG INCREASE KNOWLEDGE OF DIABETES Description: Patient will be able to verbalize the importance of good diabetes management Outcome: Progressing Goal: RH STG INCREASE KNOWLEDGE OF HYPERTENSION Description: Patient will be able to verbalize the risks of hypertension Outcome: Progressing Goal: RH STG INCREASE KNOWLEDGE OF STROKE PROPHYLAXIS Description: Patient will be able to verbalize her stoke prophylaxis medication Outcome: Progressing

## 2019-08-19 ENCOUNTER — Encounter (HOSPITAL_COMMUNITY): Payer: Self-pay | Admitting: Psychology

## 2019-08-19 ENCOUNTER — Inpatient Hospital Stay (HOSPITAL_COMMUNITY): Payer: Self-pay | Admitting: Physical Therapy

## 2019-08-19 ENCOUNTER — Inpatient Hospital Stay (HOSPITAL_COMMUNITY): Payer: Self-pay | Admitting: Occupational Therapy

## 2019-08-19 LAB — GLUCOSE, CAPILLARY
Glucose-Capillary: 188 mg/dL — ABNORMAL HIGH (ref 70–99)
Glucose-Capillary: 140 mg/dL — ABNORMAL HIGH (ref 70–99)
Glucose-Capillary: 129 mg/dL — ABNORMAL HIGH (ref 70–99)
Glucose-Capillary: 306 mg/dL — ABNORMAL HIGH (ref 70–99)

## 2019-08-19 LAB — CREATININE, SERUM
Creatinine, Ser: 0.58 mg/dL (ref 0.44–1.00)
GFR calc Af Amer: >60 mL/min (ref >60)
GFR calc non Af Amer: >60 mL/min (ref >60)

## 2019-08-19 MED ORDER — SENNOSIDES-DOCUSATE SODIUM 8.6-50 MG PO TABS
1.0000 | ORAL_TABLET | Freq: Every day | ORAL | Status: DC
  Administered 2019-08-19 – 2019-08-25 (×4): 1 via ORAL
  Filled 2019-08-19 (×10): qty 1

## 2019-08-19 NOTE — Consult Note (Addendum)
Neuropsychological Consultation   Patient:   Jacqueline Leach   DOB:   03-07-1956  MR Number:  [id number]  Location:  [facility] [id number] [place] [address] Dept: [place]: [phone]           Date of Service:   08/19/2019  Start Time:   9 AM End Time:   10 AM  Provider/Observer:  Ilean Skill, Psy.D.       Clinical Neuropsychologist       Billing Code/Service: [phone]  Chief Complaint:    Jacqueline Leach is a 63 year old female with history of diabetes, hypertension on no antihypertensive meds.  Presented 08/09/2019 with sudden onset of right sided weakness resulting in a fall and slurred speech as well as hematoma right leg.  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  TPA was initiated but stopped due to hematoma in right thigh.    Reason for Service:  Patient referred for neuropsychological consultation due to coping and adjustment.  Below is the HPI for the current admission.    HPI: Jacqueline Leach is a 63 year old right-handed female with history of diabetes mellitus noncompliant with medications, hypertension on no antihypertensive medications.  Per chart review she lives with her children and assistance as needed.  Reportedly independent prior to admission driving as well as independent ADLs.  She is a retired Merchandiser, retail.  1 level home 3 steps to entry.  Presented 08/09/2019 from [facility] with sudden onset of right side weakness resulting in a fall and slurred speech as well as hematoma right leg.  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  Remote lacunar infarct in the right cerebral white matter.  No hemorrhage or hydrocephalus noted.  TPA was initiated but stopped due to some hematemesis and hematoma right thigh.  Echocardiogram with ejection fraction 65% without emboli.   Carotid Dopplers unremarkable.  CT of head outside hospital showed no large vessel occlusion.  Admission chemistries with SARS coronavirus negative, hemoglobin A1c 11.8, WBC 10,900.  Neurology follow-up maintained on aspirin and Plavix for CVA prophylaxis x3 weeks then aspirin alone.  Subcutaneous Lovenox for DVT prophylaxis.  Tolerating a regular diet.  Therapy evaluations completed and patient was admitted for a comprehensive rehab program.  Current Status:  Patient was alert and oriented.  Emotionally distressed with talking or thinking about current loss of function and not taking care of self and feeling like she likely played a role in her stroke.  Patient denied consent depressive symptoms, just acute when thinking about current loss of function.  We will need to keep eye on possible development of depression symptoms.  No prior depression or anxiety symptoms or treatment.  Behavioral Observation: Jacqueline Leach  presents as a 63 y.o.-year-old Right Caucasian Female who appeared her stated age. her dress was Appropriate and she was Well Groomed and her manners were Appropriate to the situation.  her participation was indicative of Appropriate and Attentive behaviors.  There were any physical disabilities noted.  she displayed an appropriate level of cooperation and motivation.     Interactions:    Active Appropriate and Attentive  Attention:   within normal limits and attention span and concentration were age appropriate  Memory:   within normal limits; recent and remote memory intact  Visuo-spatial:  within normal limits  Speech (Volume):  normal  Speech:   normal; normal  Thought Process:  Coherent and Relevant  Though Content:  WNL; not suicidal and not homicidal  Orientation:   person, place, time/date and situation  Judgment:   Good  Planning:   Good  Affect:    Appropriate  Mood:    Dysphoric  Insight:   Good  Intelligence:   normal  Medical History:  History  reviewed. No pertinent past medical history.      Psychiatric History:  No prior psychiatric history.  Family Med/Psych History: History reviewed. No pertinent family history.  Risk of Suicide/Violence: low   Impression/DX:  Jacqueline Leach is a 63 year old female with history of diabetes, hypertension on no antihypertensive meds.  Presented 08/09/2019 with sudden onset of right sided weakness resulting in a fall and slurred speech as well as hematoma right leg.  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  TPA was initiated but stopped due to hematoma in right thigh.    Patient was alert and oriented.  Emotionally distressed with talking or thinking about current loss of function and not taking care of self and feeling like she likely played a role in her stroke.  Patient denied consent depressive symptoms, just acute when thinking about current loss of function.  We will need to keep eye on possible development of depression symptoms.  No prior depression or anxiety symptoms or treatment.   Disposition/Plan:  Worked of coping and adjustment issues.  Will follow up if needed.  Diagnosis:    Left pontine cerebrovascular accident [facility]) - Plan: Ambulatory referral to Neurology  Dyslipidemia, goal LDL below 70 - Plan: atorvastatin (LIPITOR) tablet 80 mg  Diabetes mellitus due to underlying condition, uncontrolled, with hyperglycemia ([place]) - Plan: insulin glargine (LANTUS) injection 20 Units, DISCONTINUED: insulin glargine (LANTUS) injection 12 Units, DISCONTINUED: insulin glargine (LANTUS) injection 15 Units  Nausea & vomiting - Plan: DG Abd Portable 1V, DG Abd Portable 1V, CANCELED: DG Abd 1 View, CANCELED: DG Abd 1 View         Electronically Signed   _______________________ Ilean Skill, Psy.D.

## 2019-08-19 NOTE — Progress Notes (Signed)
Physical Therapy Session Note  Patient Details  Name: Jenessa Gillingham MRN: [id number] Date of Birth: May 12, 1956  Today's Date: 08/19/2019 PT Individual Time: 0803-0900 PT Individual Time Calculation (min): 57 min   Short Term Goals: Week 1:  PT Short Term Goal 1 (Week 1): Pt will negotiate 3 steps with LRAD & mod assist. PT Short Term Goal 2 (Week 1): Pt will ambulate 75 ft with LRAD & min assist. PT Short Term Goal 3 (Week 1): Pt will complete bed mobility consistently with min assist. PT Short Term Goal 4 (Week 1): Pt will complete bed<>w/c transfers consistently with CGA.  Skilled Therapeutic Interventions/Progress Updates: Pt presented at sink performing oral hygiene in w/c. Once completed pt propelled tro rehab gym via hemi technique supervision level. Performed stand pivot transfer to mat CGA for STS and minA for transfer. PTA noted that orders in for ELR from MD. Pt transferred to supine and performed SAQ 3 x 10 while PTA obtained ELR. Pt then performed following exercises for R NMR via forced use. AASLR 2 x 10, AA heel slides 2 x 10, bridges 2 x 10. Turned to sidelying minA and performed clamshells 2 x 10. Pt returned to sitting and participated in gait training x 40f with RW and min/modA and ace bandage for DF assist. Pt required minA for RLE placement due to increased adduction and tactile cues for quad activation but minimizing hyperextension. Pt transported back to room at end of session and remained in w/c with seat alarm on, call bell within reach and needs met.      Therapy Documentation Precautions:  Precautions Precautions: Fall Precaution Comments: R hemi Restrictions Weight Bearing Restrictions: No General:   Vital Signs:   Pain:     Therapy/Group: Individual Therapy  Azora Bonzo  Annabel Gibeau, PTA  08/19/2019, 1:12 PM

## 2019-08-19 NOTE — Progress Notes (Signed)
[place] PHYSICAL MEDICINE & REHABILITATION PROGRESS NOTE   Subjective/Complaints: [name] has no complaints. She remembers me from acute care consult. She is in positive mood and feels she is improving with therapy. Had good appetite and ate most of her lunch. She has been trying to eat more fiber so she does not require a suppository to have a BM. Denies pain.   ROS- denies CP, SOB, no bowel or bladder issues    Objective:   No results found. No results for input(s): WBC, HGB, HCT, PLT in the last 72 hours. Recent Labs    08/19/19 0652  CREATININE 0.58    Intake/Output Summary (Last 24 hours) at 08/19/2019 1315 Last data filed at 08/19/2019 0700 Gross per 24 hour  Intake 560 ml  Output -  Net 560 ml     Physical Exam: Vital Signs Blood pressure 125/65, pulse 79, temperature 98.3 F (36.8 C), temperature source Oral, resp. rate 18, height 5\' 3"  (1.6 m), weight 76.2 kg, SpO2 96 %.   General: No acute distress Mood and affect are appropriate Heart: Regular rate and rhythm no rubs murmurs or extra sounds Lungs: Clear to auscultation, breathing unlabored, no rales or wheezes Abdomen: Positive bowel sounds, soft nontender to palpation, nondistended Extremities: No clubbing, cyanosis, or edema Skin: No evidence of breakdown, no evidence of rash Neurologic: Cranial nerves II through XII intact, motor strength is 5/5 in LEFT  deltoid, bicep, tricep, grip, hip flexor, knee extensors, ankle dorsiflexor and plantar flexor 2- RIght biceps and delt, 3- Right Knee ext , 0/5 in Right ankle DF/PF and HF  Sensory exam normal sensation to light touch and proprioception in bilateral upper and lower extremities Musculoskeletal: Full range of motion in all 4 extremities. No joint swelling   Assessment/Plan: 1. 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  Physiatrist  is providing close team supervision and 24 hour management of active medical problems listed below.  Physiatrist and rehab team continue to assess barriers to discharge/monitor patient progress toward functional and medical goals  Care Tool:  Bathing    Body parts bathed by patient: Right arm, Chest, Abdomen, Right upper leg, Left upper leg, Face, Front perineal area, Buttocks, Right lower leg   Body parts bathed by helper: Left arm, Left lower leg     Bathing assist Assist Level: Minimal Assistance - Patient > 75%     Upper Body Dressing/Undressing Upper body dressing   What is the patient wearing?: Pull over shirt    Upper body assist Assist Level: Supervision/Verbal cueing    Lower Body Dressing/Undressing Lower body dressing      What is the patient wearing?: Underwear/pull up, Pants     Lower body assist Assist for lower body dressing: Minimal Assistance - Patient > 75%     Toileting Toileting    Toileting assist Assist for toileting: Minimal Assistance - Patient > 75%     Transfers Chair/bed transfer  Transfers assist  Chair/bed transfer activity did not occur: Safety/medical concerns  Chair/bed transfer assist level: Minimal Assistance - Patient > 75%     Locomotion Ambulation   Ambulation assist   Ambulation activity did not occur: Safety/medical concerns  Assist level: Minimal Assistance - Patient > 75% Assistive device: Walker-rolling Max distance: 60   Walk 10 feet activity   Assist  Walk 10 feet activity did not occur: Safety/medical concerns  Assist level: Minimal Assistance -  Patient > 75% Assistive device: Walker-rolling   Walk 50 feet activity   Assist Walk 50 feet with 2 turns activity did not occur: Safety/medical concerns  Assist level: Minimal Assistance - Patient > 75% Assistive device: Walker-rolling    Walk 150 feet activity   Assist Walk 150 feet activity did not occur: Safety/medical concerns         Walk  10 feet on uneven surface  activity   Assist Walk 10 feet on uneven surfaces activity did not occur: Safety/medical concerns         Wheelchair     Assist   Type of Wheelchair: Manual Wheelchair activity did not occur: Safety/medical concerns  Wheelchair assist level: Supervision/Verbal cueing Max wheelchair distance: 150    Wheelchair 50 feet with 2 turns activity    Assist    Wheelchair 50 feet with 2 turns activity did not occur: Safety/medical concerns   Assist Level: Supervision/Verbal cueing   Wheelchair 150 feet activity     Assist  Wheelchair 150 feet activity did not occur: Safety/medical concerns   Assist Level: Supervision/Verbal cueing   Blood pressure 125/65, pulse 79, temperature 98.3 F (36.8 C), temperature source Oral, resp. rate 18, height 5\' 3"  (1.6 m), weight 76.2 kg, SpO2 96 %.  Medical Problem List and Plan: 1.  Right side weakness and slurred speech secondary to left pontine acute perforator infarction             -patient may shower             -ELOS/Goals: modI in PT, MinA in OT, I in SLP 2.  Antithrombotics: -DVT/anticoagulation: Lovenox             -antiplatelet therapy: Aspirin and Plavix x3 weeks then aspirin alone 3. Pain Management: Tylenol as needed 4. Mood: Provide emotional support             -antipsychotic agents: N/A 5. Neuropsych: This patient is capable of making decisions on her own behalf. 6. Skin/Wound Care: Routine skin checks 7. Fluids/Electrolytes/Nutrition: Routine in and outs with follow-up chemistries 8.  Diabetes mellitus.  Hemoglobin A1c 11.8.  non compliant with meds at home. Diabetic teaching CBG (last 3)  Recent Labs    08/18/19 2132 08/19/19 0616 08/19/19 1152  GLUCAP 161* 188* 140*  CBG running a little hi increase lantus to 20U  Elevated PM CBG, no metformin due to intolerance sulfa allergy so no sulfonylureas, add novalog  9.  Hyperlipidemia.  Lipitor. 10. HTN: She was on no home  medications, but is currently hypertensive. Neurology recommends permissive HTN for 1 week, in 2nd week of rehab will add hypertensive agent if BP still elevated.  Vitals:   08/18/19 1933 08/19/19 0553  BP: 127/62 125/65  Pulse: 85 79  Resp: 18 18  Temp: 97.9 F (36.6 C) 98.3 F (36.8 C)  SpO2: 96% 96%  BP fairly well controlled  12/6 and 12/7 10. Education: Will require stroke prevention education and education regarding the importance of good control of her HTN and DM. She was noncompliant with DM medications at home. 11.  Insomnia schedule trazodone  12. Constipation: Will add senna-docusate.  LOS: 7 days A FACE TO FACE EVALUATION WAS PERFORMED  Armand Preast P Jamica Woodyard 08/19/2019, 1:15 PM

## 2019-08-19 NOTE — Progress Notes (Signed)
Occupational Therapy Session Note  Patient Details  Name: Jacqueline Leach MRN: [id number] Date of Birth: 26-Dec-1955  Today's Date: 08/19/2019 OT Individual Time: 8887-5797 and 1415-1530 OT Individual Time Calculation (min): 91 min and 75 min  Short Term Goals: Week 1:  OT Short Term Goal 1 (Week 1): Pt will perform shower transfer with min A overall. OT Short Term Goal 2 (Week 1): Pt will perform toileting with min A overall. OT Short Term Goal 3 (Week 1): Pt will perform UB dressing with min A overall. OT Short Term Goal 4 (Week 1): Pt will perform LB dressing with min A overall.  Skilled Therapeutic Interventions/Progress Updates:    Pt greeted in w/c with no c/o pain. ADL needs met, ready for tx. Escorted pt to dayroom and focused on Rt NMR. She completed forward/backward pedaling on small arm bike in both seated and standing positions. She completed 3 minutes of each cycle with Rt hand strapped with ACE wrap. Pt mindfully grasping pedal and balancing UE recruitment vs compensating with Lt. Transitioned to seated towel slides, facilitation needed for extending elbow fully and moving into external rotation. OT completed shoulder ER stretching to improve ROM. After stand pivoting over to mat with Min A, Rt sided weightbearing with quadruped position on mat. Pt completed a sorting task with L UE and intermittently with R UE given active assist from therapist. Returned to her room afterwards and worked on grasp/release of styrofoam cups. Pt required assistance for arm elevation due to shoulder hiking tendencies. Worked on pt gripping cup, bringing cup to chin, and mindfully releasing cup back onto her half lap tray. Pt continues to be pleased with the progression of her R UE. At end of session pt remained in the w/c with all needs, still working with her cup.    2nd Session 1:1 tx (75 min) Pt greeted in w/c with no c/o pain. ADL needs were met. Discussed d/c plans for home. Pt reports living with 2  very supportive dtrs as well as her young grandchildren. Dtr Burman Nieves will be there 24/7. Pt has a walk-in shower but will need a more stable seat for it. The bathroom with her shower is walker accessible not w/c accessible. Pt will need a 3:1 and also a shower seat from CIR if dtr Burman Nieves does not get these equipment items beforehand. Pt pt, Burman Nieves has CVA caregiver experience. Afterwards taught pt how to don her hoodie using adaptive hemi strategies with supervision assist. Next guided pt through R UE NMR exercises using UE ranger and also unweighted dowel bar. Pt needed A to fully extend elbow and to elevate Rt arm without using compensatory strategies. Pt able to maintain hand grip during all dowel bar exercises! We celebrated! At end of session pt remained in w/c with all needs within reach and half lap tray.   Therapy Documentation Precautions:  Precautions Precautions: Fall Precaution Comments: R hemi Restrictions Weight Bearing Restrictions: No Pain: Pain Assessment Pain Scale: 0-10 Pain Score: 0-No pain ADL:      Therapy/Group: Individual Therapy  Aeris Hersman A Tammala Weider 08/19/2019, 12:28 PM

## 2019-08-20 ENCOUNTER — Inpatient Hospital Stay (HOSPITAL_COMMUNITY): Payer: Self-pay | Admitting: Occupational Therapy

## 2019-08-20 ENCOUNTER — Inpatient Hospital Stay (HOSPITAL_COMMUNITY): Payer: Self-pay | Admitting: Physical Therapy

## 2019-08-20 LAB — GLUCOSE, CAPILLARY
Glucose-Capillary: 185 mg/dL — ABNORMAL HIGH (ref 70–99)
Glucose-Capillary: 174 mg/dL — ABNORMAL HIGH (ref 70–99)
Glucose-Capillary: 128 mg/dL — ABNORMAL HIGH (ref 70–99)
Glucose-Capillary: 219 mg/dL — ABNORMAL HIGH (ref 70–99)

## 2019-08-20 MED ORDER — INSULIN GLARGINE 100 UNIT/ML ~~LOC~~ SOLN
23.0000 [IU] | Freq: Every day | SUBCUTANEOUS | Status: DC
  Administered 2019-08-21 – 2019-08-23 (×3): 23 [IU] via SUBCUTANEOUS
  Filled 2019-08-20 (×3): qty 0.23

## 2019-08-20 NOTE — Progress Notes (Signed)
Physical Therapy Session Note  Patient Details  Name: Jacqueline Leach MRN: [id number] Date of Birth: 09-07-1956  Today's Date: 08/20/2019 PT Individual Time: 6815-9470 and 7615-1834 PT Individual Time Calculation (min): 44 min and 47 min  Short Term Goals: Week 1:  PT Short Term Goal 1 (Week 1): Pt will negotiate 3 steps with LRAD & mod assist. PT Short Term Goal 2 (Week 1): Pt will ambulate 75 ft with LRAD & min assist. PT Short Term Goal 3 (Week 1): Pt will complete bed mobility consistently with min assist. PT Short Term Goal 4 (Week 1): Pt will complete bed<>w/c transfers consistently with CGA.  Skilled Therapeutic Interventions/Progress Updates: Tx1:  Pt presented in w/c agreeable to therapy. Pt propelled via hemi technique to rehab gym supervision A. Performed stand pivot transfer to mat with CGA and improved safety with hand placement. Pt attempted toe taps to 4 in step however continues to demonstrate decreased R hip flexion strength to lift to 4in step. Pt then performed toe taps with LLE with emphasis on maintaining R quad activation but avoiding knee hyperextension. Performed STS 2 x 5 with LLE on balance board to increased RLE recruitment. PTA donned ace bandage and pt ambulated 43f with RW and minA. Will trial shoe with AFO this afternoon. Pt propelled back to room in same manner as prior and remained in w/c with call bell within reach and needs met.   Tx2: Pt presented in w/c agreeable to therapy. Pt denies pain during session. Pt propelled via hemi technique to rehab gym supervision level. Session focused on gait training and trial of AFO. Trialed GRAFO with pt ambulating x 255fx 2. Pt required increased cues for quad activation with intermittent R toe catching. Pt then trialed PLS with improved control and decreased toe catching for same distance. Pt stated felt more comfortable with PLS brace. Pt then participated in stair training use of B rails with PTA providing minA for RUE  management and pt  ascending/descending x 4 steps with minA overall. Ambulated additional 7548fith RW and minA with verbal cues for increasing BOS and RLE placement. Pt felt pleased with overall day's progress. Pt transported back to room at end of session and remained in w/c with seat alarm on, call bell within reach and needs met.      Therapy Documentation Precautions:  Precautions Precautions: Fall Precaution Comments: R hemi Restrictions Weight Bearing Restrictions: No General:   Vital Signs: Therapy Vitals Temp: 97.8 F (36.6 C) Pulse Rate: 78 Resp: 14 BP: (!) 144/90 Patient Position (if appropriate): Sitting Oxygen Therapy SpO2: 99 % O2 Device: Room Air Pain:     Therapy/Group: Individual Therapy  Anes Rigel  Lexton Hidalgo, PTA  08/20/2019, 3:50 PM

## 2019-08-20 NOTE — Progress Notes (Signed)
Physical Therapy Weekly Progress Note  Patient Details  Name: Jacqueline Leach MRN: [id number] Date of Birth: Dec 15, 1955  Beginning of progress report period: August 13, 2019 End of progress report period: August 20, 2019  Today's Date: 08/20/2019   Patient has met 4 of 4 short term goals.  Pt has demonstrated good progress during current week of therapy. Pt has progressed to overall minA for gait and transfers. Pt is able to ascend/descend x 4 steps with B rails with minA for strengthening. Pt continues to demonstrate poor hip flexor activation, limited hamstring strength causing intermittent hyperextension during gait and poor DF. Gait trials with AFO began today with good tolerance.   Patient continues to demonstrate the following deficits muscle weakness and muscle paralysis and impaired timing and sequencing, abnormal tone, unbalanced muscle activation and decreased coordination and therefore will continue to benefit from skilled PT intervention to increase functional independence with mobility.  Patient progressing toward long term goals..  Continue plan of care.  PT Short Term Goals Week 1:  PT Short Term Goal 1 (Week 1): Pt will negotiate 3 steps with LRAD & mod assist. PT Short Term Goal 1 - Progress (Week 1): Met PT Short Term Goal 2 (Week 1): Pt will ambulate 75 ft with LRAD & min assist. PT Short Term Goal 2 - Progress (Week 1): Met PT Short Term Goal 3 (Week 1): Pt will complete bed mobility consistently with min assist. PT Short Term Goal 3 - Progress (Week 1): Met PT Short Term Goal 4 (Week 1): Pt will complete bed<>w/c transfers consistently with CGA. PT Short Term Goal 4 - Progress (Week 1): Met Week 2:  PT Short Term Goal 1 (Week 2): Pt will ambulate 161f with min assist and LRAD PT Short Term Goal 2 (Week 2): [facility] PT Short Term Goal 3 (Week 2): Pt will maintain standing balance with 1 UE for >2 min with min assist for  safety       Therapy Documentation Precautions:  Precautions Precautions: Fall Precaution Comments: R hemi Restrictions Weight Bearing Restrictions: No    Vital Signs: Therapy Vitals Temp: 97.8 F (36.6 C) Pulse Rate: 78 Resp: 14 BP: (!) 144/90 Patient Position (if appropriate): Sitting Oxygen Therapy SpO2: 99 % O2 Device: Room Air   Therapy/Group: Individual Therapy  Rosita DeChalus  Rosita DeChalus, PTA  08/20/2019, 4:29 PM

## 2019-08-20 NOTE — Progress Notes (Signed)
[place] PHYSICAL MEDICINE & REHABILITATION PROGRESS NOTE   Subjective/Complaints:  C/o constipation  No abd pain   "I can move my toes on R"  ROS- denies CP, SOB, no bowel or bladder issues    Objective:   No results found. No results for input(s): WBC, HGB, HCT, PLT in the last 72 hours. Recent Labs    08/19/19 0652  CREATININE 0.58    Intake/Output Summary (Last 24 hours) at 08/20/2019 [id number] Last data filed at 08/20/2019 0742 Gross per 24 hour  Intake 960 ml  Output -  Net 960 ml     Physical Exam: Vital Signs Blood pressure 117/61, pulse 72, temperature 98.5 F (36.9 C), resp. rate 20, height 5\' 3"  (1.6 m), weight 76.2 kg, SpO2 97 %.   General: No acute distress Mood and affect are appropriate Heart: Regular rate and rhythm no rubs murmurs or extra sounds Lungs: Clear to auscultation, breathing unlabored, no rales or wheezes Abdomen: Positive bowel sounds, soft nontender to palpation, nondistended Extremities: No clubbing, cyanosis, or edema Skin: No evidence of breakdown, no evidence of rash Neurologic: Cranial nerves II through XII intact, motor strength is 5/5 in LEFT  deltoid, bicep, tricep, grip, hip flexor, knee extensors, ankle dorsiflexor and plantar flexor 2- RIght biceps and delt, 3- Right Knee ext , 0/5 in Right ankle DF/PF, 2- Right toe flexion and ext  and HF  Sensory exam normal sensation to light touch and proprioception in bilateral upper and lower extremities Musculoskeletal: Full range of motion in all 4 extremities. No joint swelling   Assessment/Plan: 1. 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  Physiatrist is providing close team supervision and 24 hour management of active medical problems listed below.  Physiatrist and rehab team continue to assess barriers to discharge/monitor patient progress toward functional and  medical goals  Care Tool:  Bathing    Body parts bathed by patient: Right arm, Chest, Abdomen, Right upper leg, Left upper leg, Face, Front perineal area, Buttocks, Right lower leg   Body parts bathed by helper: Left arm, Left lower leg     Bathing assist Assist Level: Minimal Assistance - Patient > 75%     Upper Body Dressing/Undressing Upper body dressing   What is the patient wearing?: Pull over shirt    Upper body assist Assist Level: Supervision/Verbal cueing    Lower Body Dressing/Undressing Lower body dressing      What is the patient wearing?: Underwear/pull up, Pants     Lower body assist Assist for lower body dressing: Minimal Assistance - Patient > 75%     Toileting Toileting    Toileting assist Assist for toileting: Minimal Assistance - Patient > 75%     Transfers Chair/bed transfer  Transfers assist  Chair/bed transfer activity did not occur: Safety/medical concerns  Chair/bed transfer assist level: Minimal Assistance - Patient > 75%     Locomotion Ambulation   Ambulation assist   Ambulation activity did not occur: Safety/medical concerns  Assist level: Minimal Assistance - Patient > 75% Assistive device: Walker-rolling Max distance: 60   Walk 10 feet activity   Assist  Walk 10 feet activity did not occur: Safety/medical concerns  Assist level: Minimal Assistance - Patient > 75% Assistive device: Walker-rolling   Walk 50 feet activity   Assist Walk 50 feet with 2 turns activity did not occur: Safety/medical concerns  Assist level: Minimal Assistance - Patient >  75% Assistive device: Walker-rolling    Walk 150 feet activity   Assist Walk 150 feet activity did not occur: Safety/medical concerns         Walk 10 feet on uneven surface  activity   Assist Walk 10 feet on uneven surfaces activity did not occur: Safety/medical concerns         Wheelchair     Assist   Type of Wheelchair: Manual Wheelchair  activity did not occur: Safety/medical concerns  Wheelchair assist level: Supervision/Verbal cueing Max wheelchair distance: 150    Wheelchair 50 feet with 2 turns activity    Assist    Wheelchair 50 feet with 2 turns activity did not occur: Safety/medical concerns   Assist Level: Supervision/Verbal cueing   Wheelchair 150 feet activity     Assist  Wheelchair 150 feet activity did not occur: Safety/medical concerns   Assist Level: Supervision/Verbal cueing   Blood pressure 117/61, pulse 72, temperature 98.5 F (36.9 C), resp. rate 20, height 5\' 3"  (1.6 m), weight 76.2 kg, SpO2 97 %.  Medical Problem List and Plan: 1.  Right side weakness and slurred speech secondary to left pontine acute perforator infarction             -patient may shower             -ELOS/Goals: modI in PT, MinA in OT, I in SLP- team conf in am  2.  Antithrombotics: -DVT/anticoagulation: Lovenox             -antiplatelet therapy: Aspirin and Plavix x3 weeks then aspirin alone 3. Pain Management: Tylenol as needed 4. Mood: Provide emotional support             -antipsychotic agents: N/A 5. Neuropsych: This patient is capable of making decisions on her own behalf. 6. Skin/Wound Care: Routine skin checks 7. Fluids/Electrolytes/Nutrition: Routine in and outs with follow-up chemistries 8.  Diabetes mellitus.  Hemoglobin A1c 11.8.  non compliant with meds at home. Diabetic teaching CBG (last 3)  Recent Labs    08/19/19 1632 08/19/19 2143 08/20/19 0625  GLUCAP 129* 306* 185*  CBG running a little hi increase lantus to 23U  Elevated PM CBG, no metformin due to intolerance sulfa allergy so no sulfonylureas, add novalog  9.  Hyperlipidemia.  Lipitor. 10. HTN: She was on no home medications, but is currently hypertensive. Neurology recommends permissive HTN for 1 week, in 2nd week of rehab will add hypertensive agent if BP still elevated.  Vitals:   08/19/19 2032 08/20/19 0258  BP: (!) 141/70 117/61   Pulse: 82 72  Resp: 20 20  Temp: 98.6 F (37 C) 98.5 F (36.9 C)  SpO2: 97% 97%  BP controlled 12/8 10. Education: Will require stroke prevention education and education regarding the importance of good control of her HTN and DM. She was noncompliant with DM medications at home. 11.  Insomnia schedule trazodone  12. Constipation: Will add senna-docusate.no BM thus far today , may need supp in am   LOS: 8 days A FACE TO FACE EVALUATION WAS PERFORMED  Charlett Blake 08/20/2019, 8:22 AM

## 2019-08-20 NOTE — Progress Notes (Signed)
Pt performed self insulin injection. Pt used left hand to clean skin with alcohol prep pad, stuck needle into left anterior thigh and tolerated well. Educated on insulin injections. Larina Earthly

## 2019-08-20 NOTE — Progress Notes (Signed)
Occupational Therapy Session Note  Patient Details  Name: Jacqueline Leach MRN: [id number] Date of Birth: [birth year]  Today's Date: 08/20/2019 OT Individual Time: [id number] and 1330-1400 OT Individual Time Calculation (min): 74 min and 30 mins   Short Term Goals: Week 1:  OT Short Term Goal 1 (Week 1): Pt will perform shower transfer with min A overall. OT Short Term Goal 2 (Week 1): Pt will perform toileting with min A overall. OT Short Term Goal 3 (Week 1): Pt will perform UB dressing with min A overall. OT Short Term Goal 4 (Week 1): Pt will perform LB dressing with min A overall.  Skilled Therapeutic Interventions/Progress Updates:    Session 1: Upon entering the room, pt seated in wheelchair and calling for assistance to go to bathroom. OT assisted pt via wheelchair into bathroom. Pt performed stand pivot transfer onto toilet with min A overall. Pt able to complete hygiene need while seated and doffed clothing items while seated for safety. Pt ambulating with min A and use of RW 6' to TTB for bathing tasks. Pt seated while bathing with assistance to wash B LEs and back. Pt able to perform lateral leans to wash buttocks and able to grasp cloth with R UE but drops of cloth secondary to gross grasp. Pt exiting the shower in the same way and dressing from wheelchair at sink with focus on sit <>stand and hemiplegic techniques. Pt standing at sink with R UE in weight bearing position for grooming tasks with min A for standing balance. Pt needing assistance to place LEs into figure four position but able to thread clothing herself. Pt utilized one hand technique to don B socks and shoes in figure four position. OT providing assistance to tie shoe laces. Focus on AROM/AAROM for R UE in all planes of movement with OT assisting pt in decreasing compensatory movements. Pt with much more movement and strength in R UE than when this therapist saw her several days ago. Plan to start theraputty HEP at next  session. Pt remained in wheelchair with R UE tray donned and all needs within reach.   Session 2: Upon entering the room, pt seated in wheelchair awaiting OT arrival. OT assisted pt to dayroom via wheelchair for time management. OT provided pt with tan soft theraputty and paper handout regarding hand strengthening and coordination. Pt able to perform exercises with R UE with encouragement and increased time. Pt very excited based on feedback from putty she can see what her hand is actually doing. Pt returning to room by propelling wheelchair with hemiplegic technique and supervision 150'. Chair alarm donned and call bell within reach upon exiting the room.   Therapy Documentation Precautions:  Precautions Precautions: Fall Precaution Comments: R hemi Restrictions Weight Bearing Restrictions: No   Pain: Pain Assessment Pain Scale: 0-10 Pain Score: 0-No pain   Therapy/Group: Individual Therapy  Gypsy Decant 08/20/2019, 11:29 AM

## 2019-08-21 ENCOUNTER — Inpatient Hospital Stay (HOSPITAL_COMMUNITY): Payer: Self-pay | Admitting: Physical Therapy

## 2019-08-21 ENCOUNTER — Inpatient Hospital Stay (HOSPITAL_COMMUNITY): Payer: Self-pay

## 2019-08-21 LAB — GLUCOSE, CAPILLARY
Glucose-Capillary: 171 mg/dL — ABNORMAL HIGH (ref 70–99)
Glucose-Capillary: 196 mg/dL — ABNORMAL HIGH (ref 70–99)
Glucose-Capillary: 215 mg/dL — ABNORMAL HIGH (ref 70–99)
Glucose-Capillary: 144 mg/dL — ABNORMAL HIGH (ref 70–99)

## 2019-08-21 NOTE — Progress Notes (Addendum)
[place] PHYSICAL MEDICINE & REHABILITATION PROGRESS NOTE   Subjective/Complaints:  No issues overnite , could move right toes this am, started to move them yesterday , no other new changes   ROS- denies CP, SOB, no bowel or bladder issues    Objective:   No results found. No results for input(s): WBC, HGB, HCT, PLT in the last 72 hours. Recent Labs    08/19/19 0652  CREATININE 0.58    Intake/Output Summary (Last 24 hours) at 08/21/2019 0837 Last data filed at 08/21/2019 0600 Gross per 24 hour  Intake 350 ml  Output -  Net 350 ml     Physical Exam: Vital Signs Blood pressure 139/60, pulse 77, temperature 98.2 F (36.8 C), resp. rate 18, height '5\' 3"'  (1.6 m), weight 76.2 kg, SpO2 95 %.   General: No acute distress Mood and affect are appropriate Heart: Regular rate and rhythm no rubs murmurs or extra sounds Lungs: Clear to auscultation, breathing unlabored, no rales or wheezes Abdomen: Positive bowel sounds, soft nontender to palpation, nondistended Extremities: No clubbing, cyanosis, or edema Skin: No evidence of breakdown, no evidence of rash Neurologic: Cranial nerves II through XII intact, motor strength is 5/5 in LEFT  deltoid, bicep, tricep, grip, hip flexor, knee extensors, ankle dorsiflexor and plantar flexor 3- RIght biceps and delt, 3- Right Knee ext , 2-/5 in Right ankle DF/PF, trace Right toe flexion and ext  And 3-  HF  +Babinski Right foot  Sensory exam normal sensation to light touch and proprioception in bilateral upper and lower extremities Musculoskeletal: Full range of motion in all 4 extremities. No joint swelling   Assessment/Plan: 1. 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  Physiatrist is providing close team supervision and 24 hour management of active medical problems listed below.  Physiatrist and rehab team continue to  assess barriers to discharge/monitor patient progress toward functional and medical goals  Care Tool:  Bathing    Body parts bathed by patient: Right arm, Chest, Abdomen, Right upper leg, Left upper leg, Face, Front perineal area, Buttocks, Right lower leg   Body parts bathed by helper: Left arm, Left lower leg     Bathing assist Assist Level: Minimal Assistance - Patient > 75%     Upper Body Dressing/Undressing Upper body dressing   What is the patient wearing?: Pull over shirt, Bra    Upper body assist Assist Level: Minimal Assistance - Patient > 75%    Lower Body Dressing/Undressing Lower body dressing      What is the patient wearing?: Underwear/pull up, Pants     Lower body assist Assist for lower body dressing: Minimal Assistance - Patient > 75%     Toileting Toileting    Toileting assist Assist for toileting: Moderate Assistance - Patient 50 - 74%     Transfers Chair/bed transfer  Transfers assist  Chair/bed transfer activity did not occur: Safety/medical concerns  Chair/bed transfer assist level: Contact Guard/Touching assist     Locomotion Ambulation   Ambulation assist   Ambulation activity did not occur: Safety/medical concerns  Assist level: Minimal Assistance - Patient > 75% Assistive device: Walker-rolling Max distance: 74   Walk 10 feet activity   Assist  Walk 10 feet activity did not occur: Safety/medical concerns  Assist level: Minimal Assistance - Patient > 75% Assistive device: Walker-rolling   Walk 50 feet activity   Assist Walk 50 feet with  2 turns activity did not occur: Safety/medical concerns  Assist level: Minimal Assistance - Patient > 75% Assistive device: Walker-rolling    Walk 150 feet activity   Assist Walk 150 feet activity did not occur: Safety/medical concerns         Walk 10 feet on uneven surface  activity   Assist Walk 10 feet on uneven surfaces activity did not occur: Safety/medical  concerns         Wheelchair     Assist   Type of Wheelchair: Manual Wheelchair activity did not occur: Safety/medical concerns  Wheelchair assist level: Supervision/Verbal cueing Max wheelchair distance: 150    Wheelchair 50 feet with 2 turns activity    Assist    Wheelchair 50 feet with 2 turns activity did not occur: Safety/medical concerns   Assist Level: Supervision/Verbal cueing   Wheelchair 150 feet activity     Assist  Wheelchair 150 feet activity did not occur: Safety/medical concerns   Assist Level: Supervision/Verbal cueing   Blood pressure 139/60, pulse 77, temperature 98.2 F (36.8 C), resp. rate 18, height '5\' 3"'  (1.6 m), weight 76.2 kg, SpO2 95 %.  Medical Problem List and Plan: 1.  Right side weakness and slurred speech secondary to left pontine acute perforator infarction        Team conference today please see physician documentation under team conference tab, met with team face-to-face to discuss problems,progress, and goals. Formulized individual treatment plan based on medical history, underlying problem and comorbidities.             -ELOS/Goals: modI in PT, MinA in OT, I in SLP- team conf in am  Stronger proximally today but no toe flexion to command, overall improving motorically  2.  Antithrombotics: -DVT/anticoagulation: Lovenox             -antiplatelet therapy: Aspirin and Plavix x3 weeks then aspirin alone 3. Pain Management: Tylenol as needed 4. Mood: Provide emotional support             -antipsychotic agents: N/A 5. Neuropsych: This patient is capable of making decisions on her own behalf. 6. Skin/Wound Care: Routine skin checks 7. Fluids/Electrolytes/Nutrition: Routine in and outs with follow-up chemistries 8.  Diabetes mellitus.  Hemoglobin A1c 11.8.  non compliant with meds at home. Diabetic teaching CBG (last 3)  Recent Labs    08/20/19 1614 08/20/19 2120 08/21/19 0619  GLUCAP 128* 219* 171*  CBG running a little hi  increase lantus to 23U , overall improving  Elevated PM CBG, no metformin due to intolerance sulfa allergy so no sulfonylureas, add novalog  9.  Hyperlipidemia.  Lipitor. 10. HTN: She was on no home medications, but is currently hypertensive. Neurology recommends permissive HTN for 1 week, in 2nd week of rehab will add hypertensive agent if BP still elevated.  Vitals:   08/20/19 1930 08/21/19 0620  BP: 133/70 139/60  Pulse: 89 77  Resp: 18 18  Temp: 97.8 F (36.6 C) 98.2 F (36.8 C)  SpO2: 98% 95%  BP controlled 12/9 10. Education: Will require stroke prevention education and education regarding the importance of good control of her HTN and DM. She was noncompliant with DM medications at home. 11.  Insomnia schedule trazodone  12. Constipation: Will add senna-docusate.no BM thus far today , may need supp in am   LOS: 9 days A FACE TO FACE EVALUATION WAS PERFORMED  Charlett Blake 08/21/2019, 8:37 AM

## 2019-08-21 NOTE — Progress Notes (Addendum)
Occupational Therapy Weekly Progress Note  Patient Details  Name: Jacqueline Leach MRN: [id number] Date of Birth: 1955/09/21  Beginning of progress report period: August 13, 2019 End of progress report period: August 21, 2019  Today's Date: 08/21/2019 OT Individual Time: 7253-6644 OT Individual Time Calculation (min): 40 min    Patient has met 4 of 4 short term goals. Pt has made excellent progress this reporting period. Pt is able to carryover hemi techniques during bathing and dressing. She dons UB clothing with (S) and LB clothing with min A. Pt is able to complete ADL transfers with CGA-min A. Pt's R UE has also made significant progress and pt is experiencing distal and proximal return, with the new ability to grasp items functionally.   Patient continues to demonstrate the following deficits: muscle weakness, unbalanced muscle activation and decreased coordination and decreased sitting balance, decreased standing balance, hemiplegia and decreased balance strategies and therefore will continue to benefit from skilled OT intervention to enhance overall performance with BADL.  Patient progressing toward long term goals..  Continue plan of care.  OT Short Term Goals Week 1:  OT Short Term Goal 1 (Week 1): Pt will perform shower transfer with min A overall. OT Short Term Goal 1 - Progress (Week 1): Met OT Short Term Goal 2 (Week 1): Pt will perform toileting with min A overall. OT Short Term Goal 2 - Progress (Week 1): Met OT Short Term Goal 3 (Week 1): Pt will perform UB dressing with min A overall. OT Short Term Goal 3 - Progress (Week 1): Met OT Short Term Goal 4 (Week 1): Pt will perform LB dressing with min A overall. OT Short Term Goal 4 - Progress (Week 1): Met Week 2:  OT Short Term Goal 1 (Week 2): STG=LTG d/t ELOS  Skilled Therapeutic Interventions/Progress Updates:     Pt received sitting up in w/c with c/o pain in posterior R knee, described as soreness. No request for  intervention. Pt completed oral care in standing at the sink with CGA. Pt able to complete UB bathing and dressing with CGA- self recalling hemi techniques. Pt required seated rest break following. Pt stood again with CGA and managed LB clothing to wash peri areas in standing. Pt returned to sitting and donned new pad. Pt stood and pulled up pants in standing with min A. Pt completed functional mobility into the bathroom with min A using RW. Pt transferred onto toilet and voided BM. Pt completed all hygiene with CGA. Pt returned to her w/c after hand hygiene at the sink. Pt left sitting up with all needs met.   Therapy Documentation Precautions:  Precautions Precautions: Fall Precaution Comments: R hemi Restrictions Weight Bearing Restrictions: No   Therapy/Group: Individual Therapy  Curtis Sites 08/21/2019, 3:15 PM

## 2019-08-21 NOTE — Progress Notes (Signed)
Social Work Patient ID: Jacqueline Leach, female   DOB: November 28, 1955, 63 y.o.   MRN: [id number]  Met with pt to discuss team conference progress toward her goals of supervision and the movement she is getting back in her arm and leg. Discharge date still 12/17. Her daughter is working on Associate Professor for her when she comes home. Pt is so pleased with the progress she has made this week and hopeful it will continue. Will work toward discharge needs for next week.

## 2019-08-21 NOTE — Progress Notes (Signed)
Physical Therapy Session Note  Patient Details  Name: Jacqueline Leach MRN: [id number] Date of Birth: [birth year]  Today's Date: 08/21/2019 PT Individual Time: 0902-1000 AND 1620-1700 PT Individual Time Calculation (min): 58 min and 40 min   Short Term Goals: Week 2:  PT Short Term Goal 1 (Week 2): Pt will ambulate 158f with min assist and LRAD PT Short Term Goal 2 (Week 2): [facility] PT Short Term Goal 3 (Week 2): Pt will maintain standing balance with 1 UE for >2 min with min assist for safety  Skilled Therapeutic Interventions/Progress Updates:  Session 1 Pt received sitting in WC and agreeable to PT. PT assisted pt to don Bil shoes and RAFO. WC mobility with supervision assist x 1533fand min cues for safety through doors and awareness of R foot around obstacles.   PT instructed pt in gait training with RW and R AFO x [id number] min assist overall and min-moderate multimodal cues for increased hip/knee flexion in swing, posture, and to prevent GR in stance on the R. Only one instance of GR noted throughout gait training.   Stand pivot transfer to mat table with min assist and BUE support on RW as well as mild stabilization to the R knee to prevent IR. Sit>supine with min assist for LE management and roll to the L. Powder board knee flexion/extension 2 x 20. Hip/knee flexion/extension in synergy 2 x 20. Min-mod assist from PT to facilitate knee flexion and encourage decreased compensations from glutes.  Rolling to the R and sit>supine through log roll R with min assist overall and moderate cues for position of the RUE to improve use with transition to sitting.   Sit<>stand from mat table x 3 with BUE push from bed, x 3 with BUE to push from thighs. And x 3 to push from RLE with BUE min assist overall with min manual facilitation to improve WB through the RLE.   Additional gait training with RW and RAFO x [id number] min assist for safety. Following  NueroMotorRe-ed on mat table, pt noted to have mild decrease in hip flexion and step hight due to fatigue in hip flexors.   Patient returned to room and left sitting in [facility] call bell in reach and all needs met.    Session 2.   Pt received sitting in WC and agreeable to PT. Pt transported to rehab gym in WC. [facility] treatment focused on dynamic balance training and gait training with RW. Pt performed lateral reach x 12 to obtain bean bag and toss toward target. 1-0 UE support throughout as well as CGA-min assist from PT to prevent anterior LOB. Significant improvement in functional grasp on the RUE compared to previous treatment session.   Gait training with RW and RAFO 2 x [id number] min assist overall for safety and tim encourage improved postural alignment. Min cues required for increased step length on the R to improve heel contact and hip flexor activation.   WC mobility x 150ft 36f supervision assist and only min cues for safety intermittently from PT for obstacle navigation using Hemi technique on the L.   Patient returned to room and left sitting in WC wit[facility] bell in reach and all needs met.          Therapy Documentation Precautions:  Precautions Precautions: Fall Precaution Comments: R hemi Restrictions Weight Bearing Restrictions: No    Vital Signs: Therapy Vitals Temp: 98.2 F (36.8 C)  Pulse Rate: 77 Resp: 18 BP: 139/60 Patient Position (if appropriate): Lying Oxygen Therapy SpO2: 95 % O2 Device: Room Air Pain: denies  Therapy/Group: Individual Therapy  Lorie Phenix 08/21/2019, 10:15 AM

## 2019-08-21 NOTE — Progress Notes (Signed)
Physical Therapy Session Note  Patient Details  Name: Jacqueline Leach MRN: [id number] Date of Birth: 05-Sep-1956  Today's Date: 08/21/2019 PT Individual Time: 1030-1115 PT Individual Time Calculation (min): 45 min   Short Term Goals: Week 2:  PT Short Term Goal 1 (Week 2): Pt will ambulate 110f with min assist and LRAD PT Short Term Goal 2 (Week 2): [facility] PT Short Term Goal 3 (Week 2): Pt will maintain standing balance with 1 UE for >2 min with min assist for safety  Skilled Therapeutic Interventions/Progress Updates: Pt presented in w/c agreeable to therapy. Pt denies pain during session. Pt propelled to day room supervision and performed stand pivot transfer to [employer] Kinet[employer]. Pt performed seated cycles at 60cm/sec 2 bouts x 1 min for general conditioning. Pt then performed STS with mirror feedback x 5 for visual aide for increased recruitment of RLE. Pt initially showed significant bias of LLE but was able to perform more equal STS with repetitions. Pt then performed standing marches at 40cm/sec 3 bouts of 10 cycles. PTA provided verbal cues for maintaining neutral knee alignment on RLE. Pt noted to have improved grasp of R hand thus performed ambulatory transfer to chair to participate in Wii Bowling without hand orthotic. Pt was able to grasp and maintain grip during transfer. Pt then participated in [place] and dynamic balance. Pt was able to complete full game approx 10 min without seated rest break with CGA. Once completed pt propelled back to room supervision and remained in w/c with seat alarm on, call bell within reach and needs met.      Therapy Documentation Precautions:  Precautions Precautions: Fall Precaution Comments: R hemi Restrictions Weight Bearing Restrictions: No   Therapy/Group: Individual Therapy  Tuere Nwosu  Keylin Ferryman, PTA  08/21/2019, 12:54 PM

## 2019-08-21 NOTE — Evaluation (Signed)
Recreational Therapy Assessment and Plan  Patient Details  Name: Jacqueline Leach MRN: [id number] Date of Birth: Apr 08, 1956 Today's Date: 08/21/2019  Rehab Potential: Good ELOS: 12/17   Assessment  Problem List:      Patient Active Problem List   Diagnosis Date Noted  . Fall at home, initial encounter 08/12/2019  . Hematoma of right thigh 08/12/2019  . Hematemesis 08/12/2019  . Essential hypertension 08/12/2019  . Hyperlipidemia 08/12/2019  . Type II diabetes mellitus, uncontrolled ([place]) 08/12/2019  . Obesity 08/12/2019  . Noncompliance with medication regimen 08/12/2019  . Left pontine cerebrovascular accident ([place]) 08/12/2019  . Ischemic stroke ([place]) - L pontine d/t small vessel dz s/p IV tPA 08/09/2019    Past Medical History: History reviewed. No pertinent past medical history. Past Surgical History: History reviewed. No pertinent surgical history.  Assessment & Plan Clinical Impression: Patient is a 63 y.o. year old female with history of diabetes mellitus noncompliant with medications, hypertension on no antihypertensive medications. Per chart review she lives with her children and assistance as needed. Reportedly independent prior to admission driving as well as independent ADLs. She is a retired Merchandiser, retail. 1 level home 3 steps to entry. Presented 08/09/2019 from [facility] with sudden onset of right side weakness resulting in a fall and slurred speech as well as hematoma right leg. 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 Remote lacunar infarct in the right cerebral white matter. No hemorrhage or hydrocephalus noted. TPA was initiated but stopped due to some hematemesis and hematoma right thigh. Echocardiogram with ejection fraction 65% without emboli. Carotid Dopplers unremarkable. CT of head outside hospital showed no large vessel occlusion. Admission chemistries with SARS coronavirus negative, hemoglobin A1c 11.8, WBC 10,900.  Neurology follow-up maintained on aspirin and Plavix for CVA prophylaxis x3 weeks then aspirin alone. .  Patient transferred to CIR on 08/12/2019 .    Pt presents with decreased activity tolerance, decreased functional mobility, decreased balance, decreased problem solving Limiting pt's independence with leisure/community pursuits.  Discussion/education during today's session focused on leisure education, activity analysis with potential modifications specifically related to doing activities with her grandson as pt became tearful when discussing interactions with him once she returns home.  Pt stated understanding of the above information.  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) Other Leisure Interests: Reading;Housework;Television Leisure Participation Style: With Family/Friends Psychosocial / Spiritual Does patient have pets?: Yes Social interaction - Mood/Behavior: Cooperative Strengths/Weaknesses Patient Strengths/Abilities: Willingness to participate;Active premorbidly Patient weaknesses: Physical limitations TR Patient demonstrates impairments in the following area(s): Endurance;Motor  Plan Rec Therapy Plan Is patient appropriate for Therapeutic Recreation?: Yes Rehab Potential: Good Treatment times per week: Min 1 TR session >20 minutes duirng LOS Estimated Length of Stay: 12/17 TR Treatment/Interventions: Adaptive equipment instruction;Patient/family education;1:1 session;Functional mobility training;Balance/vestibular training;Recreation/leisure participation;Therapeutic activities;Leisure education;Other (Comment)(relaxation strategies)  Recommendations for other services: None   Discharge Criteria: Patient will be discharged from TR if patient refuses treatment 3 consecutive times without medical reason.  If treatment goals not met,  if there is a change in medical status, if patient makes no progress towards goals or if patient is discharged from hospital.  The above assessment, treatment plan, treatment alternatives and goals were discussed and mutually agreed upon: by patient  [place] 08/21/2019, 2:14 PM

## 2019-08-21 NOTE — Patient Care Conference (Signed)
Inpatient RehabilitationTeam Conference and Plan of Care Update Date: 08/21/2019   Time: 10:10 AM    Patient Name: Jacqueline Leach      Medical Record Number: [id number]  Date of Birth: 1956-04-21 Sex: Female         Room/Bed: 4W23C/4W23C-01 Payor Info: Payor: MEDICAID POTENTIAL / Plan: MEDICAID POTENTIAL / Product Type: *No Product type* /    Admit Date/Time:  08/12/2019  4:09 PM  Primary Diagnosis:  <principal problem not specified>  Patient Active Problem List   Diagnosis Date Noted  . Fall at home, initial encounter 08/12/2019  . Hematoma of right thigh 08/12/2019  . Hematemesis 08/12/2019  . Essential hypertension 08/12/2019  . Hyperlipidemia 08/12/2019  . Type II diabetes mellitus, uncontrolled ([place]) 08/12/2019  . Obesity 08/12/2019  . Noncompliance with medication regimen 08/12/2019  . Left pontine cerebrovascular accident ([place]) 08/12/2019  . Ischemic stroke ([place]) - L pontine d/t small vessel dz s/p IV tPA 08/09/2019    Expected Discharge Date: Expected Discharge Date: 08/29/19  Team Members Present: Physician leading conference: Dr. Alysia Penna Social Worker Present: Ovidio Kin, LCSW Nurse Present: Judee Clara, LPN Case Manager: Karene Fry, RN PT Present: Barrie Folk, PT;Rosita Dechalus, PTA OT Present: Darleen Crocker, OT SLP Present: Jettie Booze, CF-SLP PPS Coordinator present : Gunnar Fusi, SLP     Current Status/Progress Goal Weekly Team Focus  Bowel/Bladder   Pt continent of B/B, LBM 08/20/19, senokot scheduled to prevent constipation  Remain continent  Assess toileting q shift and prn   Swallow/Nutrition/ Hydration             ADL's   Min A stand pivot bathroom transfers, Supervision-Min A UB self care, Min A bathing at shower level, Mod A LB dressing  supervision  ADL retraining, standing balance, NMR, pt/family education   Mobility   minA bed mobiltiy, CGA STS and CGA to minA stand pivot transfers, gait up to 9ft with RW and AFO.   supervision overall except min assist 3 steps without rails with LRAD  R NMR, balance, endurance, gait   Communication             Safety/Cognition/ Behavioral Observations            Pain   Has c/o pain, has prns  Pain less than 5  Assess pain q shift and prn   Skin   Ecchymosis on right thigh due to fall at home  Prevent further skin breakdown  Assess skin q shift and prn      *See Care Plan and progress notes for long and short-term goals.     Barriers to Discharge  Current Status/Progress Possible Resolutions Date Resolved   Nursing                  PT                    OT                  SLP                SW                Discharge Planning/Teaching Needs:  Doing much better this week making progress and adjusting better-neuro-psych saw and assisting with support. Daughter to be there with her at home      Team Discussion: Getting some return, improving, BS high, BP controlled.  RN - weak R grip, can flex R foot,  cont B/B.  OT min transfers, S/min UB ADLs, min/mod LB dressing, goals S.  PT progressing, min/CGA transfers/pivots, gait CGA/min A 100', trial an AFO, needs AFO consult.  No SLP.   Revisions to Treatment Plan: N/A     Medical Summary Current Status: Diarrhea resolved, diabetic management ongoing Weekly Focus/Goal: Work on diabetic management for home discharge  Barriers to Discharge: Medical stability   Possible Resolutions to Barriers: Training for family   Continued Need for Acute Rehabilitation Level of Care: The patient requires daily medical management by a physician with specialized training in physical medicine and rehabilitation for the following reasons: Direction of a multidisciplinary physical rehabilitation program to maximize functional independence : Yes Medical management of patient stability for increased activity during participation in an intensive rehabilitation regime.: Yes Analysis of laboratory values and/or radiology reports  with any subsequent need for medication adjustment and/or medical intervention. : Yes   I attest that I was present, lead the team conference, and concur with the assessment and plan of the team.   Jodell Cipro M 08/22/2019, 2:34 PM  Team conference was held via web/ teleconference due to [place] - 19

## 2019-08-22 ENCOUNTER — Inpatient Hospital Stay (HOSPITAL_COMMUNITY): Payer: Self-pay | Admitting: Occupational Therapy

## 2019-08-22 ENCOUNTER — Inpatient Hospital Stay (HOSPITAL_COMMUNITY): Payer: Self-pay | Admitting: Physical Therapy

## 2019-08-22 LAB — GLUCOSE, CAPILLARY
Glucose-Capillary: 177 mg/dL — ABNORMAL HIGH (ref 70–99)
Glucose-Capillary: 277 mg/dL — ABNORMAL HIGH (ref 70–99)
Glucose-Capillary: 129 mg/dL — ABNORMAL HIGH (ref 70–99)
Glucose-Capillary: 216 mg/dL — ABNORMAL HIGH (ref 70–99)

## 2019-08-22 NOTE — Progress Notes (Signed)
Physical Therapy Session Note  Patient Details  Name: Jacqueline Leach MRN: [id number] Date of Birth: Oct 16, 1955  Today's Date: 08/22/2019 PT Individual Time: 1300-1410 PT Individual Time Calculation (min): 70 min   Short Term Goals: Week 2:  PT Short Term Goal 1 (Week 2): Pt will ambulate 125f with min assist and LRAD PT Short Term Goal 2 (Week 2): [facility] Short Term Goal 3 (Week 2): Pt will maintain standing balance with 1 UE for >2 min with min assist for safety Week 3:     Skilled Therapeutic Interventions/Progress Updates:   Pt received sitting in WC and agreeable to PT. [facility] donned RAFO and shoes while pt sitting in WC. Gait training to trial new shoes that daughter provided earlier in the day x 375fwith CGA and BUE support on RW.  Orthotist present for AFO consult additional gait training with AFO and RW 2 x [id number] for GRF posterior support ottobok.  CGA fading to min assist with fatigue and mild toe drag with increased distance.   PT instructed in in dynamic balance training on Bioness BITS system with balance sensor. Weight shifting/trunk control random 4x 1 min BUE support on first 2 bouts and no UE on last 2 bout. Min assist overall and moderate cues for improve symmetry of trunk musculature activation. Target selection standing with 1 UE support on RW using R and L UE as appropriate, moderate hand over hand assist for UE control. Line tracing with the RUE with mod assist from PT for decreased compensations through upper trapezius.   Patient returned to room and [facility] in reach and all needs met.          Therapy Documentation Precautions:  Precautions Precautions: Fall Precaution Comments: R hemi Restrictions Weight Bearing Restrictions: No Vital Signs: Therapy Vitals Temp: 98.6 F (37 C) Temp Source: Oral Pulse Rate: 92 Resp:  18 BP: (!) 144/63 Patient Position (if appropriate): Sitting Oxygen Therapy SpO2: 98 % Pain: denies   Therapy/Group: Individual Therapy  AustLorie Phenix10/2020, 5:28 PM

## 2019-08-22 NOTE — Progress Notes (Signed)
Occupational Therapy Session Note  Patient Details  Name: Jacqueline Leach MRN: [id number] Date of Birth: [birth year]  Today's Date: 08/22/2019 OT Individual Time: 1530-1600 OT Individual Time Calculation (min): 30 min    Short Term Goals: Week 2:  OT Short Term Goal 1 (Week 2): STG=LTG d/t ELOS  Skilled Therapeutic Interventions/Progress Updates:    Upon entering the room, pt seated in wheelchair with no c/o pain and agreeable to OT intervention. OT assisted pt via wheelchair to day room for time management. Focus on R UE NMR with use of resistive peg board. Pt able to complete graded tasks from removal of pegs placed in board to placing and stacking pegs on top of each other in resistive board. Pt keep R UE close to side and decreasing compensatory movements on her own with increased grip and dexterity noted! Much improved over this week. Pt very excited and returns to room in same manner as above. Call bell and all needed items within reach upon exiting the room.   Therapy Documentation Precautions:  Precautions Precautions: Fall Precaution Comments: R hemi Restrictions Weight Bearing Restrictions: No General:   Vital Signs: Therapy Vitals Temp: 98.6 F (37 C) Temp Source: Oral Pulse Rate: 92 Resp: 18 BP: (!) 144/63 Patient Position (if appropriate): Sitting Oxygen Therapy SpO2: 98 %   Therapy/Group: Individual Therapy  Gypsy Decant 08/22/2019, 4:07 PM

## 2019-08-22 NOTE — Progress Notes (Addendum)
[facility] PHYSICAL MEDICINE & REHABILITATION PROGRESS NOTE   Subjective/Complaints:  No issues overnite but has noted some increase freq BM yesterday , no abd pain  Also has had some hypersensitivity of the RIght thigh   ROS- denies CP, SOB, no bowel or bladder issues    Objective:   No results found. No results for input(s): WBC, HGB, HCT, PLT in the last 72 hours. No results for input(s): NA, K, CL, CO2, GLUCOSE, BUN, CREATININE, CALCIUM in the last 72 hours.  Intake/Output Summary (Last 24 hours) at 08/22/2019 0854 Last data filed at 08/21/2019 1900 Gross per 24 hour  Intake 240 ml  Output -  Net 240 ml     Physical Exam: Vital Signs Blood pressure 139/60, pulse 76, temperature 97.9 F (36.6 C), temperature source Oral, resp. rate 16, height 5\' 3"  (1.6 m), weight 76.2 kg, SpO2 95 %.   General: No acute distress Mood and affect are appropriate Heart: Regular rate and rhythm no rubs murmurs or extra sounds Lungs: Clear to auscultation, breathing unlabored, no rales or wheezes Abdomen: Positive bowel sounds, soft nontender to palpation, nondistended Extremities: No clubbing, cyanosis, or edema Skin: No evidence of breakdown, no evidence of rash Neurologic: Cranial nerves II through XII intact, motor strength is 5/5 in LEFT  deltoid, bicep, tricep, grip, hip flexor, knee extensors, ankle dorsiflexor and plantar flexor 3- RIght biceps and delt, 3- Right Knee ext , 2-/5 in Right ankle DF/PF, trace Right toe flexion and ext  And 3-  HF  +Babinski Right foot  Sensory exam normal sensation to light touch and proprioception in bilateral upper and lower extremities Musculoskeletal: Full range of motion in all 4 extremities. No joint swelling, mild tenderness to light palpation RIght lateral thigh and popliteal space    Assessment/Plan: 1. 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  Physiatrist is providing close team supervision and 24 hour management of active medical problems listed below.  Physiatrist and rehab team continue to assess barriers to discharge/monitor patient progress toward functional and medical goals  Care Tool:  Bathing    Body parts bathed by patient: Right arm, Chest, Abdomen, Right upper leg, Left upper leg, Face, Front perineal area, Buttocks, Right lower leg, Left arm, Left lower leg   Body parts bathed by helper: Left arm, Left lower leg     Bathing assist Assist Level: Contact Guard/Touching assist     Upper Body Dressing/Undressing Upper body dressing   What is the patient wearing?: Pull over shirt    Upper body assist Assist Level: Supervision/Verbal cueing    Lower Body Dressing/Undressing Lower body dressing      What is the patient wearing?: Underwear/pull up, Pants     Lower body assist Assist for lower body dressing: Minimal Assistance - Patient > 75%     Toileting Toileting    Toileting assist Assist for toileting: Contact Guard/Touching assist     Transfers Chair/bed transfer  Transfers assist  Chair/bed transfer activity did not occur: Safety/medical concerns  Chair/bed transfer assist level: Contact Guard/Touching assist     Locomotion Ambulation   Ambulation assist   Ambulation activity did not occur: Safety/medical concerns  Assist level: Minimal Assistance - Patient > 75% Assistive device: Walker-rolling Max distance: 100   Walk 10 feet activity   Assist  Walk 10 feet activity did not occur: Safety/medical concerns  Assist level: Minimal Assistance - Patient >  75% Assistive device: Walker-rolling   Walk 50 feet activity   Assist Walk 50 feet with 2 turns activity did not occur: Safety/medical concerns  Assist level: Minimal Assistance - Patient > 75% Assistive device: Walker-rolling    Walk 150 feet activity   Assist Walk 150 feet activity did not  occur: Safety/medical concerns         Walk 10 feet on uneven surface  activity   Assist Walk 10 feet on uneven surfaces activity did not occur: Safety/medical concerns         Wheelchair     Assist   Type of Wheelchair: Manual Wheelchair activity did not occur: Safety/medical concerns  Wheelchair assist level: Supervision/Verbal cueing Max wheelchair distance: 150    Wheelchair 50 feet with 2 turns activity    Assist    Wheelchair 50 feet with 2 turns activity did not occur: Safety/medical concerns   Assist Level: Supervision/Verbal cueing   Wheelchair 150 feet activity     Assist  Wheelchair 150 feet activity did not occur: Safety/medical concerns   Assist Level: Supervision/Verbal cueing   Blood pressure 139/60, pulse 76, temperature 97.9 F (36.6 C), temperature source Oral, resp. rate 16, height 5\' 3"  (1.6 m), weight 76.2 kg, SpO2 95 %.  Medical Problem List and Plan: 1.  Right side weakness and slurred speech secondary to left pontine acute perforator infarction              -ELOS/Goals: modI in PT, MinA in OT, I in SLP- team conf in am  Stronger proximally today but no toe flexion to command, overall improving motorically  2.  Antithrombotics: -DVT/anticoagulation: Lovenox             -antiplatelet therapy: Aspirin and Plavix x3 weeks then aspirin alone 3. Pain Management: Tylenol as needed hypersenstive over the RIght lateral thigh and back of knee, had a fall with large lateral hip hematoma , , old blood products likely tracking down and causing some irritation , should improve with time  4. Mood: Provide emotional support             -antipsychotic agents: N/A 5. Neuropsych: This patient is capable of making decisions on her own behalf. 6. Skin/Wound Care: Routine skin checks 7. Fluids/Electrolytes/Nutrition: Routine in and outs with follow-up chemistries 8.  Diabetes mellitus.  Hemoglobin A1c 11.8.  non compliant with meds at home.  Diabetic teaching CBG (last 3)  Recent Labs    08/21/19 1734 08/21/19 2105 08/22/19 0615  GLUCAP 215* 144* 177*  CBG running a little hi increase lantus to 25U , overall improving  Elevated PM CBG, no metformin due to intolerance sulfa allergy so no sulfonylureas, add novalog  9.  Hyperlipidemia.  Lipitor. 10. HTN: She was on no home medications, but is currently hypertensive. Neurology recommends permissive HTN for 1 week, in 2nd week of rehab will add hypertensive agent if BP still elevated.  Vitals:   08/21/19 2001 08/22/19 0432  BP: 130/62 139/60  Pulse: 83 76  Resp: 18 16  Temp: 97.7 F (36.5 C) 97.9 F (36.6 C)  SpO2: 97% 95%  BP controlled 12/10 10. Education: Will require stroke prevention education and education regarding the importance of good control of her HTN and DM. She was noncompliant with DM medications at home. 11.  Insomnia schedule trazodone  12. Constipation: Will add senna-docusate.no BM thus far today , may need supp in am   LOS: 10 days A FACE TO FACE EVALUATION WAS PERFORMED  Luanna Salk Emmry Hinsch 08/22/2019, 8:54 AM

## 2019-08-22 NOTE — Progress Notes (Signed)
Occupational Therapy Session Note  Patient Details  Name: Jacqueline Leach MRN: [id number] Date of Birth: [birth year]  Today's Date: 08/22/2019 OT Individual Time: [id number] and 1100-1200 OT Individual Time Calculation (min): 45 min and 60 mins   Short Term Goals: Week 2:  OT Short Term Goal 1 (Week 2): STG=LTG d/t ELOS  Skilled Therapeutic Interventions/Progress Updates:    Session 1: Upon entering the room, pt seated on EOB and finishing breakfast. Pt ambulating from bed > bathroom with RW and min A for toileting needs. Pt then returning to wheelchair and OT assisted pt to ADL apartment for time management. Pt verbalized having recliner chair and sofa at home she most often sits on. Pt ambulating on carpeted surface to various furniture items and able to manage controlled sit and stand with min guard and min cuing for technique. Bed in apartment about 3-4 inches taller than her bed at home but she will be getting out of bed on R side. Pt needing min A for supine >sit and recommended pulling bed out from wall so that she could sleep on other side if she was unable to manage tasks independently by discharge. Pt returning to room by propelled wheelchair with hemiplegic technique and supervision for task. Chair alarm activated and call bell within reach.   Session 2: Upon entering the room, pt seated in wheelchair awaiting OT arrival. Prior to arriving, pt obtained all needed items for shower from wheelchair level independently. Pt ambulating with min A into bathroom with RW for toileting needs with min A. Pt required min guard for balance during hygiene and clothing management. Pt doffed all clothing while seated on toilet and standing to transfer onto TTB for bathing with min A overall. Pt bathing while seated with one stand to wash buttocks and peri area with min guard for balance. Pt utilized R UE to wash upper LE and doing hand over hand as needed to incorporate into functional tasks. Pt ambulating  back out of bathroom to wheelchair for dressing tasks. Pt utilized hemiplegic technique and able to reach across with deodorant to L armpit with R UE this session. She needed assistance to move back and forth to apply but was able to get there while holding item. Pt's daughter arrived this session with socks and shoes for use with AFO this afternoon. Pt needing min A to don these items and assistance to place R LE into figure four position. Pt remained in wheelchair with daughter and NT present. All needs within reach.   Therapy Documentation Precautions:  Precautions Precautions: Fall Precaution Comments: R hemi Restrictions Weight Bearing Restrictions: No   Therapy/Group: Individual Therapy  Gypsy Decant 08/22/2019, 12:36 PM

## 2019-08-23 ENCOUNTER — Inpatient Hospital Stay (HOSPITAL_COMMUNITY): Payer: Self-pay | Admitting: Physical Therapy

## 2019-08-23 ENCOUNTER — Inpatient Hospital Stay (HOSPITAL_COMMUNITY): Payer: Self-pay | Admitting: Occupational Therapy

## 2019-08-23 ENCOUNTER — Inpatient Hospital Stay (HOSPITAL_COMMUNITY): Payer: Self-pay | Admitting: *Deleted

## 2019-08-23 LAB — GLUCOSE, CAPILLARY
Glucose-Capillary: 175 mg/dL — ABNORMAL HIGH (ref 70–99)
Glucose-Capillary: 180 mg/dL — ABNORMAL HIGH (ref 70–99)
Glucose-Capillary: 201 mg/dL — ABNORMAL HIGH (ref 70–99)
Glucose-Capillary: 180 mg/dL — ABNORMAL HIGH (ref 70–99)

## 2019-08-23 MED ORDER — INSULIN GLARGINE 100 UNIT/ML ~~LOC~~ SOLN
25.0000 [IU] | Freq: Every day | SUBCUTANEOUS | Status: DC
  Administered 2019-08-24 – 2019-08-25 (×2): 25 [IU] via SUBCUTANEOUS
  Filled 2019-08-23 (×4): qty 0.25

## 2019-08-23 NOTE — Progress Notes (Signed)
Physical Therapy Session Note  Patient Details  Name: Jacqueline Leach MRN: [id number] Date of Birth: 23-Dec-1955  Today's Date: 08/23/2019 PT Individual Time: 1015-1127 PT Individual Time Calculation (min): 72 min   Short Term Goals: Week 2:  PT Short Term Goal 1 (Week 2): Pt will ambulate 192f with min assist and LRAD PT Short Term Goal 2 (Week 2): [facility] Short Term Goal 3 (Week 2): Pt will maintain standing balance with 1 UE for >2 min with min assist for safety  Skilled Therapeutic Interventions/Progress Updates:   Pt received sitting in WC and agreeable to PT. PT assisted pt to don AFO and shoes. WC Mobility with hemi technique x 2048f   Gait training with RW and RAFO. X [id number] CGA fading to min assist for safety. Increasing toe drag noted in the RLE for the last [id number] provided moderate multimodal cues for increased step height, step width and symmetrical weight shifting.   Pt performed sit<>stand from PT with CGA-supervision assist throughout treatment with min cues for improved positioning at the front of the seat and increased anterior weight shift.   Pre-gait weight shifting and stepping to target with the LLE 3 x 5 with HHA on the R. Max cues for improved attention to the RLE as well as sustain knee extension and activation of the glutes.   Dynamic gait training to step over 3 canes x2 leading with the RLE to force hip flexion and x 2 leading the LLE to force RLE knee flexion. Orthotist then present to proivde pt with AFO and R shoe with toe cap. Additional gait training x [id number] CGA and only min cues for step length/height.   Kinetron reciprocal marching in sitting 3 x 1 min. Standing march x 1 min with BUE support on rails and x 1 min with UE support on PT shoulders. Improved symmetry through trunk  With UE on PT. Standing balance without UE support 2 x 1 min with min cues for weight shift R to maintain midline. Stand pivot  [facility]   [facility] returned to room and [facility] and all needs met.           Therapy Documentation Precautions:  Precautions Precautions: Fall Precaution Comments: R hemi Restrictions Weight Bearing Restrictions: No Pain: Pain Assessment Pain Scale: 0-10 Pain Score: 4  Pain Type: Acute pain Pain Location: Leg Pain Orientation: Right Pain Radiating Towards: (R) Knee  Pain Descriptors / Indicators: Aching Pain Frequency: Intermittent Pain Onset: With Activity Patients Stated Pain Goal: 0 Pain Intervention(s): Repositioned;Emotional support   Therapy/Group: Individual Therapy  AustiLorie Phenix1/2020, 1:05 PM

## 2019-08-23 NOTE — Progress Notes (Signed)
Occupational Therapy Session Note  Patient Details  Name: Jacqueline Leach MRN: [id number] Date of Birth: [birth year]  Today's Date: 08/23/2019 OT Individual Time: [phone] and [id number] OT Individual Time Calculation (min): 56 min and 73 min  Short Term Goals: Week 2:  OT Short Term Goal 1 (Week 2): STG=LTG d/t ELOS  Skilled Therapeutic Interventions/Progress Updates:    Pt greeted in w/c with no c/o pain. Requesting to hold off on shower until PM OT session. Wanting to start session with completing oral care and grooming tasks. Close supervision for sit<stand at sink. Min facilitation at elbow for distal reaching (I.e. turning on lightswitch and reaching for paper towels). Vcs for using active assist techniques to manipulate toothbrush and wash face with wash cloth using R UE. Pt does not yet have the shoulder flexion capabilities to meet task demands unassisted at this time. While sitting, continued to work on her Rt hand. Provided pt with supplies to make holiday cards for her grandchildren. Right now she exhibits a weak tripod grasp with Rt but does not have enough strength to apply pressure onto paper. Her colored pencil was modified with built up tubing and pt used gross grasp with OT assisting with wrist flexion. Taught pt how she can provide this assist with Lt while using Rt to sign checks at home. When she was finished making cards, pt worked on writing her signature with the Rt hand using the adaptive strategies mentioned above and elbow supported on her bedside table. At end of session left pt with extra blank pages to keep working on handwriting skills. She was left with all needs within reach.    Also provided pt with a walker bag for increasing safety with ADL/IADL item transport at home.   2nd Session 1:1 tx (73 min) Pt greeted in w/c, reporting Rt leg pain to be manageable for tx without medicinal interventions. She wanted to shower. Pt completed toileting, bathing, and dressing  tasks during session. She ambulated with RW and Min A with and without Rt AFO. During stated ADL tasks, pt able to use R UE functionally with and without active assist from Lt. She continues to be mindful of incorporating Rt for NMR. Steady assist for dynamic standing balance and close supervision for dynamic sitting activity. She needed assist to place R LE into figure 4 position in the shower. Pt able to doff and don her Rt AFO today given increased time and min cuing for adaptive techniques. At end of session pt wanted to remain in the w/c. Left her with all needs within reach.    Also provided pt with lavender for her pillowcase to increase ease of sleep.   Therapy Documentation Precautions:  Precautions Precautions: Fall Precaution Comments: R hemi Restrictions Weight Bearing Restrictions: No Pain: Pain Assessment Pain Scale: 0-10 Pain Score: 6  Pain Type: Acute pain Pain Location: Leg Pain Orientation: Right Pain Radiating Towards: (R) Knee  Pain Descriptors / Indicators: Aching Pain Frequency: Intermittent Pain Onset: With Activity Patients Stated Pain Goal: 0 Pain Intervention(s): Medication (See eMAR);Emotional support ADL:     Therapy/Group: Individual Therapy  Rachael Ferrie A Knute Mazzuca 08/23/2019, 12:19 PM

## 2019-08-23 NOTE — Progress Notes (Signed)
Recreational Therapy Session Note  Patient Details  Name: Jacqueline Leach MRN: [id number] Date of Birth: 29-Sep-1955 Today's Date: 08/23/2019 Time:907-930 Pain: c/o RLE pain when touched, reporting 6?10 but only when touched,  RN in giving morning meds and also administered premedicated for pain prior to PT  Skilled Therapeutic Interventions/Progress Updates: Session focused on leisure education and relaxation training.  Discussed leisure as a form of relaxation, especially reading as pt stated she really enjoys this activity. Provided education on diaphragmatic breathing with demonstration & handout.  Pt stated understanding & appreciation for the information.  Therapy/Group: Individual Therapy Jacqueline Leach 08/23/2019, 12:25 PM

## 2019-08-23 NOTE — Progress Notes (Signed)
[facility] PHYSICAL MEDICINE & REHABILITATION PROGRESS NOTE   Subjective/Complaints:  No issues overnite , several BM yesterday  Pt asking whether daughter needs training   ROS- denies CP, SOB, no bowel or bladder issues    Objective:   No results found. No results for input(s): WBC, HGB, HCT, PLT in the last 72 hours. No results for input(s): NA, K, CL, CO2, GLUCOSE, BUN, CREATININE, CALCIUM in the last 72 hours.  Intake/Output Summary (Last 24 hours) at 08/23/2019 0847 Last data filed at 08/23/2019 0700 Gross per 24 hour  Intake 480 ml  Output --  Net 480 ml     Physical Exam: Vital Signs Blood pressure (!) 130/53, pulse 71, temperature 97.6 F (36.4 C), temperature source Oral, resp. rate 18, height 5\' 3"  (1.6 m), weight 76.2 kg, SpO2 98 %.   General: No acute distress Mood and affect are appropriate Heart: Regular rate and rhythm no rubs murmurs or extra sounds Lungs: Clear to auscultation, breathing unlabored, no rales or wheezes Abdomen: Positive bowel sounds, soft nontender to palpation, nondistended Extremities: No clubbing, cyanosis, or edema Skin: No evidence of breakdown, no evidence of rash Neurologic: Cranial nerves II through XII intact, motor strength is 5/5 in LEFT  deltoid, bicep, tricep, grip, hip flexor, knee extensors, ankle dorsiflexor and plantar flexor 3- RIght biceps and delt, 3- Right Knee ext , 2-/5 in Right ankle DF/PF, trace Right toe flexion and ext  And 3-  HF  +Babinski Right foot  Sensory exam normal sensation to light touch and proprioception in bilateral upper and lower extremities Musculoskeletal: Full range of motion in all 4 extremities. No joint swelling, mild tenderness to light palpation RIght lateral thigh and popliteal space    Assessment/Plan: 1. 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  Physiatrist is  providing close team supervision and 24 hour management of active medical problems listed below.  Physiatrist and rehab team continue to assess barriers to discharge/monitor patient progress toward functional and medical goals  Care Tool:  Bathing    Body parts bathed by patient: Right arm, Chest, Abdomen, Right upper leg, Left upper leg, Face, Front perineal area, Buttocks, Left arm   Body parts bathed by helper: Left arm, Left lower leg     Bathing assist Assist Level: Minimal Assistance - Patient > 75%     Upper Body Dressing/Undressing Upper body dressing   What is the patient wearing?: Pull over shirt    Upper body assist Assist Level: Supervision/Verbal cueing    Lower Body Dressing/Undressing Lower body dressing      What is the patient wearing?: Underwear/pull up, Pants     Lower body assist Assist for lower body dressing: Minimal Assistance - Patient > 75%     Toileting Toileting    Toileting assist Assist for toileting: Contact Guard/Touching assist     Transfers Chair/bed transfer  Transfers assist  Chair/bed transfer activity did not occur: Safety/medical concerns  Chair/bed transfer assist level: Contact Guard/Touching assist     Locomotion Ambulation   Ambulation assist   Ambulation activity did not occur: Safety/medical concerns  Assist level: Minimal Assistance - Patient > 75% Assistive device: Walker-rolling Max distance: 100   Walk 10 feet activity   Assist  Walk 10 feet activity did not occur: Safety/medical concerns  Assist level: Minimal Assistance - Patient > 75% Assistive device: Walker-rolling   Walk 50 feet activity  Assist Walk 50 feet with 2 turns activity did not occur: Safety/medical concerns  Assist level: Minimal Assistance - Patient > 75% Assistive device: Walker-rolling    Walk 150 feet activity   Assist Walk 150 feet activity did not occur: Safety/medical concerns         Walk 10 feet on uneven  surface  activity   Assist Walk 10 feet on uneven surfaces activity did not occur: Safety/medical concerns         Wheelchair     Assist   Type of Wheelchair: Manual Wheelchair activity did not occur: Safety/medical concerns  Wheelchair assist level: Supervision/Verbal cueing Max wheelchair distance: 150    Wheelchair 50 feet with 2 turns activity    Assist    Wheelchair 50 feet with 2 turns activity did not occur: Safety/medical concerns   Assist Level: Supervision/Verbal cueing   Wheelchair 150 feet activity     Assist  Wheelchair 150 feet activity did not occur: Safety/medical concerns   Assist Level: Supervision/Verbal cueing   Blood pressure (!) 130/53, pulse 71, temperature 97.6 F (36.4 C), temperature source Oral, resp. rate 18, height 5\' 3"  (1.6 m), weight 76.2 kg, SpO2 98 %.  Medical Problem List and Plan: 1.  Right side weakness and slurred speech secondary to left pontine acute perforator infarction              -ELOS/Goals: modI in PT, MinA in OT, I in SLP-Daughter available for family ed    2.  Antithrombotics: -DVT/anticoagulation: Lovenox             -antiplatelet therapy: Aspirin and Plavix x3 weeks then aspirin alone 3. Pain Management: Tylenol as needed hypersenstive over the RIght lateral thigh and back of knee, had a fall with large lateral hip hematoma , , old blood products likely tracking down and causing some irritation , should improve with time  4. Mood: Provide emotional support             -antipsychotic agents: N/A 5. Neuropsych: This patient is capable of making decisions on her own behalf. 6. Skin/Wound Care: Routine skin checks 7. Fluids/Electrolytes/Nutrition: Routine in and outs with follow-up chemistries 8.  Diabetes mellitus.  Hemoglobin A1c 11.8.  non compliant with meds at home. Diabetic teaching CBG (last 3)  Recent Labs    08/22/19 1711 08/22/19 2123 08/23/19 0652  GLUCAP 129* 216* 175*  CBG running a  little hi increase lantus to 25U , overall improving  Elevated PM CBG, no metformin due to intolerance sulfa allergy so no sulfonylureas, add novalog  9.  Hyperlipidemia.  Lipitor. 10. HTN: She was on no home medications, but is currently hypertensive. Neurology recommends permissive HTN for 1 week, in 2nd week of rehab will add hypertensive agent if BP still elevated.  Vitals:   08/22/19 1940 08/23/19 0507  BP: (!) 146/77 (!) 130/53  Pulse: 74 71  Resp: 18 18  Temp: 98.5 F (36.9 C) 97.6 F (36.4 C)  SpO2: 97% 98%  BP controlled 12/10 10. Education: Will require stroke prevention education and education regarding the importance of good control of her HTN and DM. She was noncompliant with DM medications at home. 11.  Insomnia schedule trazodone  12. Constipation: Will add senna-docusate.improved no supp needed  LOS: 11 days A FACE TO [place] [facility] Jacqueline Leach 08/23/2019, 8:47 AM

## 2019-08-24 DIAGNOSIS — E669 Obesity, unspecified: Secondary | ICD-10-CM

## 2019-08-24 DIAGNOSIS — E1169 Type 2 diabetes mellitus with other specified complication: Secondary | ICD-10-CM

## 2019-08-24 LAB — GLUCOSE, CAPILLARY
Glucose-Capillary: 178 mg/dL — ABNORMAL HIGH (ref 70–99)
Glucose-Capillary: 227 mg/dL — ABNORMAL HIGH (ref 70–99)
Glucose-Capillary: 178 mg/dL — ABNORMAL HIGH (ref 70–99)
Glucose-Capillary: 166 mg/dL — ABNORMAL HIGH (ref 70–99)

## 2019-08-24 MED ORDER — INSULIN ASPART 100 UNIT/ML ~~LOC~~ SOLN
5.0000 [IU] | Freq: Three times a day (TID) | SUBCUTANEOUS | Status: DC
  Administered 2019-08-24 – 2019-08-29 (×14): 5 [IU] via SUBCUTANEOUS

## 2019-08-24 NOTE — Progress Notes (Signed)
[facility] PHYSICAL MEDICINE & REHABILITATION PROGRESS NOTE   Subjective/Complaints:  C/o mild soreness behind right knee. Otherwise very pleased with her progress  ROS: Patient denies fever, rash, sore throat, blurred vision, nausea, vomiting, diarrhea, cough, shortness of breath or chest pain,  headache, or mood change.    Objective:   No results found. No results for input(s): WBC, HGB, HCT, PLT in the last 72 hours. No results for input(s): NA, K, CL, CO2, GLUCOSE, BUN, CREATININE, CALCIUM in the last 72 hours.  Intake/Output Summary (Last 24 hours) at 08/24/2019 1250 Last data filed at 08/24/2019 0800 Gross per 24 hour  Intake 484 ml  Output --  Net 484 ml     Physical Exam: Vital Signs Blood pressure 137/69, pulse 76, temperature 98.1 F (36.7 C), temperature source Oral, resp. rate 16, height 5\' 3"  (1.6 m), weight 76.2 kg, SpO2 94 %.   Constitutional: No distress . Vital signs reviewed. HEENT: EOMI, oral membranes moist Neck: supple Cardiovascular: RRR without murmur. No JVD    Respiratory: CTA Bilaterally without wheezes or rales. Normal effort    GI: BS +, non-tender, non-distended   Extremities: No clubbing, cyanosis, or edema Skin: No evidence of breakdown, no evidence of rash Neurologic: Cranial nerves II through XII intact, motor strength is 5/5 in LEFT  deltoid, bicep, tricep, grip, hip flexor, knee extensors, ankle dorsiflexor and plantar flexor 3- RIght biceps and delt, 3- Right Knee ext , 2-/5 in Right ankle DF/PF, trace Right toe flexion and ext  And 3-  HF --stble    Sensory exam normal sensation to light touch and proprioception in bilateral upper and lower extremities Musculoskeletal: Full range of motion in all 4 extremities. No joint swelling, mild tenderness to light palpation RIght pop space   Assessment/Plan: 1. 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  Physiatrist is providing close team supervision and 24 hour management of active medical problems listed below.  Physiatrist and rehab team continue to assess barriers to discharge/monitor patient progress toward functional and medical goals  Care Tool:  Bathing    Body parts bathed by patient: Right arm, Chest, Abdomen, Right upper leg, Left upper leg, Face, Front perineal area, Buttocks, Left arm, Left lower leg   Body parts bathed by helper: Right lower leg     Bathing assist Assist Level: Minimal Assistance - Patient > 75%     Upper Body Dressing/Undressing Upper body dressing   What is the patient wearing?: Pull over shirt    Upper body assist Assist Level: Set up assist    Lower Body Dressing/Undressing Lower body dressing      What is the patient wearing?: Underwear/pull up, Pants     Lower body assist Assist for lower body dressing: Contact Guard/Touching assist     Toileting Toileting    Toileting assist Assist for toileting: Contact Guard/Touching assist     Transfers Chair/bed transfer  Transfers assist  Chair/bed transfer activity did not occur: Safety/medical concerns  Chair/bed transfer assist level: Contact Guard/Touching assist     Locomotion Ambulation   Ambulation assist   Ambulation activity did not occur: Safety/medical concerns  Assist level: Minimal Assistance - Patient > 75% Assistive device: Walker-rolling Max distance: 100   Walk 10 feet activity   Assist  Walk 10 feet activity did not occur: Safety/medical concerns  Assist level: Minimal Assistance - Patient > 75% Assistive device: Walker-rolling  Walk 50 feet activity   Assist Walk 50 feet with 2 turns activity did not occur: Safety/medical concerns  Assist level: Minimal Assistance - Patient > 75% Assistive device: Walker-rolling    Walk 150 feet activity   Assist Walk 150 feet activity did not occur: Safety/medical  concerns         Walk 10 feet on uneven surface  activity   Assist Walk 10 feet on uneven surfaces activity did not occur: Safety/medical concerns         Wheelchair     Assist   Type of Wheelchair: Manual Wheelchair activity did not occur: Safety/medical concerns  Wheelchair assist level: Supervision/Verbal cueing Max wheelchair distance: 150    Wheelchair 50 feet with 2 turns activity    Assist    Wheelchair 50 feet with 2 turns activity did not occur: Safety/medical concerns   Assist Level: Supervision/Verbal cueing   Wheelchair 150 feet activity     Assist  Wheelchair 150 feet activity did not occur: Safety/medical concerns   Assist Level: Supervision/Verbal cueing   Blood pressure 137/69, pulse 76, temperature 98.1 F (36.7 C), temperature source Oral, resp. rate 16, height 5\' 3"  (1.6 m), weight 76.2 kg, SpO2 94 %.  Medical Problem List and Plan: 1.  Right side weakness and slurred speech secondary to left pontine acute perforator infarction              -Daughter available for family ed   -ELOS 12/17   2.  Antithrombotics: -DVT/anticoagulation: Lovenox             -antiplatelet therapy: Aspirin and Plavix x3 weeks then aspirin alone 3. Pain Management: Tylenol as needed hypersenstive over the RIght lateral thigh and back of knee, had a fall with large lateral hip hematoma , , old blood products likely tracking down and causing some irritation.  -this has been discussed with pt and should improve with time  4. Mood: Provide emotional support             -antipsychotic agents: N/A 5. Neuropsych: This patient is capable of making decisions on her own behalf. 6. Skin/Wound Care: Routine skin checks 7. Fluids/Electrolytes/Nutrition: Routine in and outs with follow-up chemistries 8.  Diabetes mellitus.  Hemoglobin A1c 11.8.  non compliant with meds at home. Diabetic teaching CBG (last 3)  Recent Labs    08/23/19 2116 08/24/19 0648  08/24/19 1142  GLUCAP 180* 178* 227*  cbg's still elevated Increase mealtime covg to 5u Continue 25u lantus 9.  Hyperlipidemia.  Lipitor. 10. HTN: She was on no home medications, but is currently hypertensive. Neurology recommends permissive HTN for 1 week, in 2nd week of rehab will add hypertensive agent if BP still elevated.  Vitals:   08/23/19 1951 08/24/19 0519  BP: (!) 146/63 137/69  Pulse: 82 76  Resp: 20 16  Temp: 98.3 F (36.8 C) 98.1 F (36.7 C)  SpO2: 96% 94%  BP controlled 12/12 10. Education: Will require stroke prevention education and education regarding the importance of good control of her HTN and DM. She was noncompliant with DM medications at home. 11.  Insomnia schedule trazodone  12. Constipation: addedsenna-docusate.  -improved no supp needed    LOS: 12 days A FACE TO [address] 08/24/2019, 12:50 PM

## 2019-08-25 ENCOUNTER — Inpatient Hospital Stay (HOSPITAL_COMMUNITY): Payer: Self-pay | Admitting: Occupational Therapy

## 2019-08-25 LAB — GLUCOSE, CAPILLARY
Glucose-Capillary: 172 mg/dL — ABNORMAL HIGH (ref 70–99)
Glucose-Capillary: 208 mg/dL — ABNORMAL HIGH (ref 70–99)
Glucose-Capillary: 131 mg/dL — ABNORMAL HIGH (ref 70–99)
Glucose-Capillary: 187 mg/dL — ABNORMAL HIGH (ref 70–99)

## 2019-08-25 NOTE — Progress Notes (Signed)
[facility] PHYSICAL MEDICINE & REHABILITATION PROGRESS NOTE   Subjective/Complaints:  No new issues. Working through left leg pain  ROS: Patient denies fever, rash, sore throat, blurred vision, nausea, vomiting, diarrhea, cough, shortness of breath or chest pain,   headache, or mood change.    Objective:   No results found. No results for input(s): WBC, HGB, HCT, PLT in the last 72 hours. No results for input(s): NA, K, CL, CO2, GLUCOSE, BUN, CREATININE, CALCIUM in the last 72 hours.  Intake/Output Summary (Last 24 hours) at 08/25/2019 1120 Last data filed at 08/25/2019 0810 Gross per 24 hour  Intake 780 ml  Output --  Net 780 ml     Physical Exam: Vital Signs Blood pressure (!) 142/71, pulse 83, temperature 98.5 F (36.9 C), resp. rate 18, height 5\' 3"  (1.6 m), weight 76.2 kg, SpO2 94 %.   Constitutional: No distress . Vital signs reviewed. HEENT: EOMI, oral membranes moist Neck: supple Cardiovascular: RRR without murmur. No JVD    Respiratory: CTA Bilaterally without wheezes or rales. Normal effort    GI: BS +, non-tender, non-distended  eezes or rales. Normal effort    GI: BS +, non-tender, non-distended   Extremities: No clubbing, cyanosis, or edema Skin: No evidence of breakdown, no evidence of rash Neurologic: Cranial nerves II through XII intact, motor strength is 5/5 in LEFT  deltoid, bicep, tricep, grip, hip flexor, knee extensors, ankle dorsiflexor and plantar flexor 3- RIght biceps and delt, 3- Right Knee ext , 2-/5 in Right ankle DF/PF, trace Right toe flexion and ext  And 3-  HF --no changes    Sensory exam normal sensation to light touch and proprioception in bilateral upper and lower extremities Musculoskeletal: Full range of motion in all 4 extremities. No joint swelling, mild tenderness to light palpation RIght pop space, lateral thigh   Assessment/Plan: 1. 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  Physiatrist is providing close team supervision and 24 hour management of active medical problems listed below.  Physiatrist and rehab team continue to assess barriers to discharge/monitor patient progress toward functional and medical goals  Care Tool:  Bathing    Body parts bathed by patient: Right arm, Chest, Abdomen, Right upper leg, Left upper leg, Face, Front perineal area, Buttocks, Left arm, Left lower leg   Body parts bathed by helper: Right lower leg     Bathing assist Assist Level: Minimal Assistance - Patient > 75%     Upper Body Dressing/Undressing Upper body dressing   What is the patient wearing?: Pull over shirt    Upper body assist Assist Level: Set up assist    Lower Body Dressing/Undressing Lower body dressing      What is the patient wearing?: Underwear/pull up, Pants     Lower body assist Assist for lower body dressing: Contact Guard/Touching assist     Toileting Toileting    Toileting assist Assist for toileting: Contact Guard/Touching assist     Transfers Chair/bed transfer  Transfers assist  Chair/bed transfer activity did not occur: Safety/medical concerns  Chair/bed transfer assist level: Contact Guard/Touching assist     Locomotion Ambulation   Ambulation assist   Ambulation activity did not occur: Safety/medical concerns  Assist level: Minimal Assistance - Patient > 75% Assistive device: Walker-rolling Max distance: 100   Walk 10 feet activity   Assist  Walk 10 feet activity did not occur: Safety/medical concerns  Assist level: Minimal Assistance - Patient > 75% Assistive device: Walker-rolling   Walk 50 feet activity   Assist Walk 50 feet with 2 turns activity did not occur: Safety/medical concerns  Assist level: Minimal Assistance - Patient > 75% Assistive device: Walker-rolling    Walk 150 feet activity   Assist Walk 150 feet activity  did not occur: Safety/medical concerns         Walk 10 feet on uneven surface  activity   Assist Walk 10 feet on uneven surfaces activity did not occur: Safety/medical concerns         Wheelchair     Assist   Type of Wheelchair: Manual Wheelchair activity did not occur: Safety/medical concerns  Wheelchair assist level: Supervision/Verbal cueing Max wheelchair distance: 150    Wheelchair 50 feet with 2 turns activity    Assist    Wheelchair 50 feet with 2 turns activity did not occur: Safety/medical concerns   Assist Level: Supervision/Verbal cueing   Wheelchair 150 feet activity     Assist  Wheelchair 150 feet activity did not occur: Safety/medical concerns   Assist Level: Supervision/Verbal cueing   Blood pressure (!) 142/71, pulse 83, temperature 98.5 F (36.9 C), resp. rate 18, height 5\' 3"  (1.6 m), weight 76.2 kg, SpO2 94 %.  Medical Problem List and Plan: 1.  Right side weakness and slurred speech secondary to left pontine acute perforator infarction              -Daughter available for family ed   -ELOS 12/17   2.  Antithrombotics: -DVT/anticoagulation: Lovenox             -antiplatelet therapy: Aspirin and Plavix x3 weeks then aspirin alone 3. Pain Management: Tylenol as needed hypersenstive over the RIght lateral thigh and back of knee, had a fall with large lateral hip hematoma , , old blood products likely tracking down and causing some irritation.  -this has been discussed with pt and should improve with time  4. Mood: Provide emotional support             -antipsychotic agents: N/A 5. Neuropsych: This patient is capable of making decisions on her own behalf. 6. Skin/Wound Care: Routine skin checks 7. Fluids/Electrolytes/Nutrition: Routine in and outs with follow-up chemistries 8.  Diabetes mellitus.  Hemoglobin A1c 11.8.  non compliant with meds at home. Diabetic teaching CBG (last 3)  Recent Labs    08/24/19 1643 08/24/19 2133  08/25/19 0631  GLUCAP 178* 166* 172*    12/12 Increased mealtime covg to 5u  Continue 25u lantus 12/13 observe with yesterday changes 9.  Hyperlipidemia.  Lipitor. 10. HTN: She was on no home medications, but is currently hypertensive. Neurology recommends permissive HTN for 1 week, in 2nd week of rehab will add hypertensive agent if BP still elevated.  Vitals:   08/24/19 2005 08/25/19 0633  BP: 133/71 (!) 142/71  Pulse: 79 83  Resp: 18 18  Temp: 98.6 F (37 C) 98.5 F (36.9 C)  SpO2: 97% 94%  BP controlled 12/13 10. Education: Will require stroke prevention education and education regarding the importance of good control of her HTN and DM. She was noncompliant with DM medications at home. 11.  Insomnia schedule trazodone  12. Constipation: added senna-docusate.  -improved no supp needed    LOS: 13 days A FACE TO [place] 08/25/2019, 11:20 AM

## 2019-08-25 NOTE — Progress Notes (Signed)
Occupational Therapy Session Note  Patient Details  Name: Jacqueline Leach MRN: [id number] Date of Birth: 01/31/1956  Today's Date: 08/25/2019 OT Individual Time: 1400-1532 OT Individual Time Calculation (min): 92 min   Short Term Goals: Week 2:  OT Short Term Goal 1 (Week 2): STG=LTG d/t ELOS  Skilled Therapeutic Interventions/Progress Updates:     Pt greeted in her w/c, requesting to shower. Ambulatory transfer to TTB completed with steady assist without her Rt AFO. Pt bathed at sit<stand level with close supervision for dynamic standing balance. Min A to place R LE into figure 4 so she could wash foot. Dressing was completed sit<stand using RW with close supervision for standing balance. Throughout tasks, pt is able to incorporate R UE functionally without assist from therapist! She used the Rt hand to manipulate toothbrush during oral care while standing at the sink! Dtr [name] was present during dressing aspect of ADL. Discussed pts CLOF and educated her on 24/7 supervision while completing these tasks at home. While in the ADL apartment, demonstrated technique for walk-in shower transfers with [name] having hands on practice with assisting pt in transferring. Taught [name] how to don pts Rt AFO and she had hands on practice with this as well. Pt ambulated down hallway approx ~125 ft with [name] providing close supervision assist. Pt did well throughout family education to direct her own care. They will need a shower chair. Discussed OT f/u and safety recommendations for home (I.e. removing throw rugs, adding nonslip treads to shower floor). [name] was receptive to education and had no further questions for OT. Pt remained in w/c with [name] present.   Therapy Documentation Precautions:  Precautions Precautions: Fall Precaution Comments: R hemi Restrictions Weight Bearing Restrictions: No Pain: Behind Rt knee, "not bad" with pt declining interventions to address   ADL:       Therapy/Group:  Individual Therapy  Estha Few A Reyhan Moronta 08/25/2019, 4:40 PM

## 2019-08-26 ENCOUNTER — Inpatient Hospital Stay (HOSPITAL_COMMUNITY): Payer: Self-pay | Admitting: Physical Therapy

## 2019-08-26 ENCOUNTER — Inpatient Hospital Stay (HOSPITAL_COMMUNITY): Payer: Self-pay | Admitting: Occupational Therapy

## 2019-08-26 LAB — CREATININE, SERUM
Creatinine, Ser: 0.66 mg/dL (ref 0.44–1.00)
GFR calc Af Amer: >60 mL/min (ref >60)
GFR calc non Af Amer: >60 mL/min (ref >60)

## 2019-08-26 LAB — GLUCOSE, CAPILLARY
Glucose-Capillary: 203 mg/dL — ABNORMAL HIGH (ref 70–99)
Glucose-Capillary: 190 mg/dL — ABNORMAL HIGH (ref 70–99)
Glucose-Capillary: 128 mg/dL — ABNORMAL HIGH (ref 70–99)
Glucose-Capillary: 146 mg/dL — ABNORMAL HIGH (ref 70–99)

## 2019-08-26 MED ORDER — INSULIN GLARGINE 100 UNIT/ML ~~LOC~~ SOLN
30.0000 [IU] | Freq: Every day | SUBCUTANEOUS | Status: DC
  Administered 2019-08-26 – 2019-08-29 (×4): 30 [IU] via SUBCUTANEOUS
  Filled 2019-08-26 (×4): qty 0.3

## 2019-08-26 MED ORDER — INSULIN GLARGINE 100 UNIT/ML ~~LOC~~ SOLN: 30.0000 [IU] | Freq: Every day | SUBCUTANEOUS | Status: DC

## 2019-08-26 NOTE — Progress Notes (Signed)
Occupational Therapy Session Note  Patient Details  Name: Jacqueline Leach MRN: [id number] Date of Birth: 1956-05-01  Today's Date: 08/26/2019 OT Individual Time: [id number] OT Individual Time Calculation (min): 56 min   Short Term Goals: Week 2:  OT Short Term Goal 1 (Week 2): STG=LTG d/t ELOS    Skilled Therapeutic Interventions/Progress Updates:    Pt greeted in the w/c finishing up breakfast, opting to wait until 2nd OT session for shower. She donned bra using adaptive standing technique with supervision for balance and then donned her shirt in standing also using RW for support. Min A for donning Rt AFO when putting on socks and shoes. Pt expressed that it is important for her to do laundry at home. Started session with pt gathering linen from closet, stooping to meet demands of task and using her walker bag for item transport. Pt ambulated with supervision assist using RW down to laundry room. Vcs for forward gaze. Pt using Rt hand to load laundry into washing machine. Lt used for retrieving items and then pt used her Rt hand again to transfer clothing into dryer. Pt able to stoop with supervision to reach in the back of the dryer to retrieve linen to place in her walker bag. Once back in the room, pt sat in a chair beside the dresser. She folded towels and wash cloths and then opened drawers with Rt hand to put folded linen away. Pt able to stoop to 3rd drawer without LOBs. Discussed laundry modification at home to increase safety, such as placing soiled clothing directly into washer after her showers (vs stooping for clothing in laundry basket) and changing location of where she'll keep her towels so she can sit, fold, and easily put them away. Pt then gathered the ADL items needed for shower this AM, including clothing and towels/wash cloths (that she had just folded). Pt used RW and walker bag to do so, stooping and ambulating as needed with supervision assist. At end of session pt returned to  the w/c. She gave OT a high five with Rt hand and we celebrated! Last week she could only give OT low fives with Rt hand! Pt continues to be pleased by her progress at [facility]. Left her with all needs within reach.   Therapy Documentation Precautions:  Precautions Precautions: Fall Precaution Comments: R hemi Restrictions Weight Bearing Restrictions: No Pain: Pt reported that Rt leg wasn't bothering her this morning    ADL:     Therapy/Group: Individual Therapy  Mena Simonis A Sabirin Baray 08/26/2019, 12:18 PM

## 2019-08-26 NOTE — Progress Notes (Signed)
Physical Therapy Session Note  Patient Details  Name: Jacqueline Leach MRN: [id number] Date of Birth: 09/30/1955  Today's Date: 08/26/2019 PT Individual Time: 1420-1530 PT Individual Time Calculation (min): 70 min   Short Term Goals: Week 2:  PT Short Term Goal 1 (Week 2): Pt will ambulate 160ft with min assist and LRAD PT Short Term Goal 2 (Week 2): Pt will transfer to and from [facility] with CGA and LRAD PT Short Term Goal 3 (Week 2): Pt will maintain standing balance with 1 UE for >2 min with min assist for safety  Skilled Therapeutic Interventions/Progress Updates: Pt presented in w/c agreeable to therapy but requesting to use bathroom. Pt denies pain during session. Pt performed ambulatory transfer to toilet close S and performed toil;et transfers at same level (+void). Once completed ambulated to sink and performed hand hygiene in standing supervision level. Pt then ambulated to rehab gym 121ft with CGA fading to close S with min verbal cues for increasing R foot clearance as PTA able to intermittently hear foot scraping against floor. Pt then performed reverse step ups in parallel bars to 6in step to simulate ascending stairs at home without rails. Pt then transported to stairs in w/c and PTA demonstrated ascending steps backwards with RW. Pt was able to ascend x 2 steps with RW and CGA.Pt also participated in toe taps to 3in steps 2 x 10 with pt demonstrating improved hip flexor activation. Participated in standing use of re bounder 15 reps x 2 without AD for dynamic balance and forced use of RUE.  Pt then transported to ADL apt and performed STS from couch with supervision. Pt then participated in obstacle course with uneven surfaces, stepping over thresholds and weaving through cones with pt requiring CGA for uneven surface with supervision for remaining parts.      Therapy Documentation Precautions:  Precautions Precautions: Fall Precaution Comments: R hemi Restrictions Weight Bearing  Restrictions: No General:   Vital Signs: Therapy Vitals Temp: 98.2 F (36.8 C) Temp Source: Oral Pulse Rate: 85 Resp: 17 BP: 131/72 Patient Position (if appropriate): Sitting Oxygen Therapy SpO2: 97 % O2 Device: Room Air   Therapy/Group: Individual Therapy  Asyia Hornung  Cecelia Graciano, PTA  08/26/2019, 3:54 PM

## 2019-08-26 NOTE — Progress Notes (Signed)
Social Work Patient ID: Jacqueline Leach, female   DOB: 15-Feb-1956, 63 y.o.   MRN: [id number]  Met with pt to inform have spoken with daughter-[name] she will be here Wed @ 1:00 for PT session to finish up family education. Equipment needs rolling walker and tub seat. Preparing for discharge Thursday.

## 2019-08-26 NOTE — Progress Notes (Signed)
Occupational Therapy Session Note  Patient Details  Name: Jacqueline Leach MRN: [id number] Date of Birth: [birth year]  Today's Date: 08/26/2019 OT Individual Time: 1100-1200 OT Individual Time Calculation (min): 60 min    Short Term Goals: Week 2:  OT Short Term Goal 1 (Week 2): STG=LTG d/t ELOS  Skilled Therapeutic Interventions/Progress Updates:    Upon entering the room, pt seated in wheelchair and requesting to shower this session. Pt has obtained clothing items prior to OT arrival and placed in RW bag. Pt standing with supervision and ambulating 10' into bathroom with close supervision. Pt engaged in toileting task with supervision overall for toileting transfer and hygiene. Pt doffing clothing items while seated on commode and then transferred onto TTB with close supervision. Pt bathing from seated position and able to utilize R UE to wash self and to hold shower head this session with no drops noted. Pt did need assistance to wash R LE and to cross it into figure four position. Pt transferred to sit onto commode to don clothing items as she plans on doing it this way at home too. Pt able to cross foot at ankles and get R LE up enough to thread pants without needed assistance for figure four position. Pt standing with close supervision - min guard for balance without use of UE support. Pt returning to stand at sink for grooming tasks at supervision level and then seated in wheelchair. Pt utilized UE Ranger and demonstrated increased control of movement in R UE for all planes this session! Pt is very excited with amount of progress she has made. Pt remained seated in wheelchair at end of session with call bell and all needed items within reach.   Therapy Documentation Precautions:  Precautions Precautions: Fall Precaution Comments: R hemi Restrictions Weight Bearing Restrictions: No   Therapy/Group: Individual Therapy  Gypsy Decant 08/26/2019, 12:25 PM

## 2019-08-26 NOTE — Progress Notes (Signed)
[facility] PHYSICAL MEDICINE & REHABILITATION PROGRESS NOTE   Subjective/Complaints:  No pains, can do high 5! Asking about some gum bleeding yesterday  ss well as some easy bruising  ROS: Patient denies fever, rash, sore throat, blurred vision, nausea, vomiting, diarrhea, cough, shortness of breath or chest pain,   headache, or mood change.    Objective:   No results found. No results for input(s): WBC, HGB, HCT, PLT in the last 72 hours. Recent Labs    08/26/19 0718  CREATININE 0.66    Intake/Output Summary (Last 24 hours) at 08/26/2019 0925 Last data filed at 08/26/2019 0730 Gross per 24 hour  Intake 960 ml  Output --  Net 960 ml     Physical Exam: Vital Signs Blood pressure 137/70, pulse 82, temperature 98.6 F (37 C), resp. rate 18, height 5\' 3"  (1.6 m), weight 76.2 kg, SpO2 98 %.   Constitutional: No distress . Vital signs reviewed. HEENT: EOMI, oral membranes moist Neck: supple Cardiovascular: RRR without murmur. No JVD    Respiratory: CTA Bilaterally without wheezes or rales. Normal effort    GI: BS +, non-tender, non-distended  eezes or rales. Normal effort    GI: BS +, non-tender, non-distended   Extremities: No clubbing, cyanosis, or edema Skin: No evidence of breakdown, no evidence of rash Neurologic: Cranial nerves II through XII intact, motor strength is 5/5 in LEFT  deltoid, bicep, tricep, grip, hip flexor, knee extensors, ankle dorsiflexor and plantar flexor 3- RIght biceps and delt, 3- Right Knee ext , 2-/5 in Right ankle DF/PF, trace Right toe flexion and ext  And 3-  HF --no changes    Sensory exam normal sensation to light touch and proprioception in bilateral upper and lower extremities Musculoskeletal: Full range of motion in all 4 extremities. No joint swelling, mild tenderness to light palpation RIght pop space, lateral thigh   Assessment/Plan: 1. 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  Physiatrist is providing close team supervision and 24 hour management of active medical problems listed below.  Physiatrist and rehab team continue to assess barriers to discharge/monitor patient progress toward functional and medical goals  Care Tool:  Bathing    Body parts bathed by patient: Right arm, Chest, Abdomen, Right upper leg, Left upper leg, Face, Front perineal area, Buttocks, Left arm, Left lower leg   Body parts bathed by helper: Right lower leg     Bathing assist Assist Level: Minimal Assistance - Patient > 75%     Upper Body Dressing/Undressing Upper body dressing   What is the patient wearing?: Pull over shirt, Bra    Upper body assist Assist Level: Supervision/Verbal cueing(bra donned in standing)    Lower Body Dressing/Undressing Lower body dressing      What is the patient wearing?: Underwear/pull up, Pants     Lower body assist Assist for lower body dressing: Supervision/Verbal cueing     Toileting Toileting    Toileting assist Assist for toileting: Contact Guard/Touching assist     Transfers Chair/bed transfer  Transfers assist  Chair/bed transfer activity did not occur: Safety/medical concerns  Chair/bed transfer assist level: Contact Guard/Touching assist     Locomotion Ambulation   Ambulation assist   Ambulation activity did not occur: Safety/medical concerns  Assist level: Minimal Assistance - Patient > 75% Assistive device: Walker-rolling Max distance: 100   Walk 10 feet activity   Assist  Walk 10  feet activity did not occur: Safety/medical concerns  Assist level: Minimal Assistance - Patient > 75% Assistive device: Walker-rolling   Walk 50 feet activity   Assist Walk 50 feet with 2 turns activity did not occur: Safety/medical concerns  Assist level: Minimal Assistance - Patient > 75% Assistive device: Walker-rolling    Walk 150 feet  activity   Assist Walk 150 feet activity did not occur: Safety/medical concerns         Walk 10 feet on uneven surface  activity   Assist Walk 10 feet on uneven surfaces activity did not occur: Safety/medical concerns         Wheelchair     Assist   Type of Wheelchair: Manual Wheelchair activity did not occur: Safety/medical concerns  Wheelchair assist level: Supervision/Verbal cueing Max wheelchair distance: 150    Wheelchair 50 feet with 2 turns activity    Assist    Wheelchair 50 feet with 2 turns activity did not occur: Safety/medical concerns   Assist Level: Supervision/Verbal cueing   Wheelchair 150 feet activity     Assist  Wheelchair 150 feet activity did not occur: Safety/medical concerns   Assist Level: Supervision/Verbal cueing   Blood pressure 137/70, pulse 82, temperature 98.6 F (37 C), resp. rate 18, height 5\' 3"  (1.6 m), weight 76.2 kg, SpO2 98 %.  Medical Problem List and Plan: 1.  Right side weakness and slurred speech secondary to left pontine acute perforator infarction              -Daughter available for family ed   -ELOS 12/17   2.  Antithrombotics: -DVT/anticoagulation: Lovenox- will d/c on 12/17             -antiplatelet therapy: Aspirin and Plavix x3 weeks then aspirin alone 12/21 3. Pain Management: Tylenol as needed hypersenstive over the RIght lateral thigh and back of knee, had a fall with large lateral hip hematoma , , old blood products likely tracking down and causing some irritation.  -this has been discussed with pt and should improve with time  4. Mood: Provide emotional support             -antipsychotic agents: N/A 5. Neuropsych: This patient is capable of making decisions on her own behalf. 6. Skin/Wound Care: Routine skin checks 7. Fluids/Electrolytes/Nutrition: Routine in and outs with follow-up chemistries 8.  Diabetes mellitus.  Hemoglobin A1c 11.8.  non compliant with meds at home. Diabetic  teaching CBG (last 3)  Recent Labs    08/25/19 1642 08/25/19 2059 08/26/19 0643  GLUCAP 131* 187* 203*    12/12 Increased mealtime covg to 5u  Continue 25u lantus increase to 30U - ? Need to switch to 70/30 12/13 observe with yesterday changes 9.  Hyperlipidemia.  Lipitor. 10. HTN: She was on no home medications, but is currently hypertensive. Neurology recommends permissive HTN for 1 week, in 2nd week of rehab will add hypertensive agent if BP still elevated.  Vitals:   08/25/19 2008 08/26/19 0520  BP: (!) 151/67 137/70  Pulse: 83 82  Resp: 20 18  Temp: 98.6 F (37 C) 98.6 F (37 C)  SpO2: 97% 98%  BP controlled 12/14 10. Education: Will require stroke prevention education and education regarding the importance of good control of her HTN and DM. She was noncompliant with DM medications at home. 11.  Insomnia schedule trazodone  12. Constipation: added senna-docusate.  -improved no supp needed    LOS: 14 days A FACE TO FACE EVALUATION WAS PERFORMED  Luanna Salk Bonham Zingale 08/26/2019, 9:25 AM

## 2019-08-27 ENCOUNTER — Inpatient Hospital Stay (HOSPITAL_COMMUNITY): Payer: Self-pay | Admitting: Occupational Therapy

## 2019-08-27 ENCOUNTER — Inpatient Hospital Stay (HOSPITAL_COMMUNITY): Payer: Self-pay

## 2019-08-27 ENCOUNTER — Inpatient Hospital Stay (HOSPITAL_COMMUNITY): Payer: Self-pay | Admitting: Physical Therapy

## 2019-08-27 LAB — GLUCOSE, CAPILLARY
Glucose-Capillary: 155 mg/dL — ABNORMAL HIGH (ref 70–99)
Glucose-Capillary: 196 mg/dL — ABNORMAL HIGH (ref 70–99)
Glucose-Capillary: 127 mg/dL — ABNORMAL HIGH (ref 70–99)
Glucose-Capillary: 177 mg/dL — ABNORMAL HIGH (ref 70–99)

## 2019-08-27 NOTE — Progress Notes (Signed)
Occupational Therapy Session Note  Patient Details  Name: Jacqueline Leach MRN: [id number] Date of Birth: 01/14/1956  Today's Date: 08/27/2019 OT Individual Time: 1400-1500 OT Individual Time Calculation (min): 60 min    Short Term Goals: Week 1:  OT Short Term Goal 1 (Week 1): Pt will perform shower transfer with min A overall. OT Short Term Goal 1 - Progress (Week 1): Met OT Short Term Goal 2 (Week 1): Pt will perform toileting with min A overall. OT Short Term Goal 2 - Progress (Week 1): Met OT Short Term Goal 3 (Week 1): Pt will perform UB dressing with min A overall. OT Short Term Goal 3 - Progress (Week 1): Met OT Short Term Goal 4 (Week 1): Pt will perform LB dressing with min A overall. OT Short Term Goal 4 - Progress (Week 1): Met Week 2:  OT Short Term Goal 1 (Week 2): STG=LTG d/t ELOS  Skilled Therapeutic Interventions/Progress Updates:    1:1 Pt reported need to go to the bathroom and wanting to perform bathing at shower level. Pt able to doff clothing with supervision with extra time. Pt ambulated into the bathroom with supervision as well perform toileting tasks sit to stand. Pt bathed in shower sit to stand with grab bar mod I. Pt transferred out of shower and into regular chair in the bathroom and dressed sit to stand. Pt required cues for dressing right UE; encouraging her to cross her right Le over the left for threading. Pt able to successfully dress with supervision. Pt ambulated back out to recliner in the room with RW with supervision.   Upgraded her home exercises for right UE; including more resistive foam block (blue) and cut up her yellow one to work on picking up smaller pieces and putting them inside of a small hole in a container. Also added small beads to her theraputty to feel, locate and retrieve from putty.   Therapy Documentation Precautions:  Precautions Precautions: Fall Precaution Comments: R hemi Restrictions Weight Bearing Restrictions:  No General:   Vital Signs: Therapy Vitals Temp: 97.9 F (36.6 C) Temp Source: Oral Pulse Rate: 79 Resp: 16 BP: 130/71 Patient Position (if appropriate): Sitting Oxygen Therapy SpO2: 97 % O2 Device: Room Air Pain:  no c/o pain   Therapy/Group: Individual Therapy  Smith, Jennifer Lynsey 08/27/2019, 3:22 PM  

## 2019-08-27 NOTE — Progress Notes (Signed)
[place] PHYSICAL MEDICINE & REHABILITATION PROGRESS NOTE   Subjective/Complaints:  Slept well, no new issues   ROS: Patient denies, blurred vision, nausea, vomiting, diarrhea, cough, shortness of breath or chest pain,      Objective:   No results found. No results for input(s): WBC, HGB, HCT, PLT in the last 72 hours. Recent Labs    08/26/19 0718  CREATININE 0.66    Intake/Output Summary (Last 24 hours) at 08/27/2019 0853 Last data filed at 08/26/2019 1818 Gross per 24 hour  Intake 436 ml  Output --  Net 436 ml     Physical Exam: Vital Signs Blood pressure (!) 142/76, pulse 83, temperature 98.6 F (37 C), temperature source Oral, resp. rate 16, height 5\' 3"  (1.6 m), weight 76.2 kg, SpO2 93 %.   Constitutional: No distress . Vital signs reviewed. HEENT: EOMI, oral membranes moist Neck: supple Cardiovascular: RRR without murmur. No JVD    Respiratory: CTA Bilaterally without wheezes or rales. Normal effort    GI: BS +, non-tender, non-distended  eezes or rales. Normal effort    GI: BS +, non-tender, non-distended   Extremities: No clubbing, cyanosis, or edema Skin: No evidence of breakdown, no evidence of rash Neurologic: Cranial nerves II through XII intact, motor strength is 5/5 in LEFT  deltoid, bicep, tricep, grip, hip flexor, knee extensors, ankle dorsiflexor and plantar flexor 3- RIght biceps and delt, 3- Right Knee ext , 2-/5 in Right ankle DF/PF, trace Right toe flexion and ext  And 3-  HF --no changes    Sensory exam normal sensation to light touch and proprioception in bilateral upper and lower extremities Musculoskeletal: Full range of motion in all 4 extremities. No joint swelling, mild tenderness to light palpation RIght pop space, lateral thigh   Assessment/Plan: 1. 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  Physiatrist is  providing close team supervision and 24 hour management of active medical problems listed below.  Physiatrist and rehab team continue to assess barriers to discharge/monitor patient progress toward functional and medical goals  Care Tool:  Bathing    Body parts bathed by patient: Right arm, Chest, Abdomen, Right upper leg, Left upper leg, Face, Front perineal area, Buttocks, Left arm, Left lower leg   Body parts bathed by helper: Right lower leg     Bathing assist Assist Level: Minimal Assistance - Patient > 75%     Upper Body Dressing/Undressing Upper body dressing   What is the patient wearing?: Pull over shirt, Bra    Upper body assist Assist Level: Supervision/Verbal cueing    Lower Body Dressing/Undressing Lower body dressing      What is the patient wearing?: Underwear/pull up, Pants     Lower body assist Assist for lower body dressing: Supervision/Verbal cueing     Toileting Toileting    Toileting assist Assist for toileting: Supervision/Verbal cueing     Transfers Chair/bed transfer  Transfers assist  Chair/bed transfer activity did not occur: Safety/medical concerns  Chair/bed transfer assist level: Contact Guard/Touching assist     Locomotion Ambulation   Ambulation assist   Ambulation activity did not occur: Safety/medical concerns  Assist level: Contact Guard/Touching assist Assistive device: Walker-rolling Max distance: 176ft   Walk 10 feet activity   Assist  Walk 10 feet activity did not occur: Safety/medical concerns  Assist level: Contact Guard/Touching assist Assistive device: Walker-rolling   Walk 50 feet activity  Assist Walk 50 feet with 2 turns activity did not occur: Safety/medical concerns  Assist level: Contact Guard/Touching assist Assistive device: Walker-rolling    Walk 150 feet activity   Assist Walk 150 feet activity did not occur: Safety/medical concerns  Assist level: Contact Guard/Touching assist       Walk 10 feet on uneven surface  activity   Assist Walk 10 feet on uneven surfaces activity did not occur: Safety/medical concerns         Wheelchair     Assist   Type of Wheelchair: Manual Wheelchair activity did not occur: Safety/medical concerns  Wheelchair assist level: Supervision/Verbal cueing Max wheelchair distance: 150    Wheelchair 50 feet with 2 turns activity    Assist    Wheelchair 50 feet with 2 turns activity did not occur: Safety/medical concerns   Assist Level: Supervision/Verbal cueing   Wheelchair 150 feet activity     Assist  Wheelchair 150 feet activity did not occur: Safety/medical concerns   Assist Level: Supervision/Verbal cueing   Blood pressure (!) 142/76, pulse 83, temperature 98.6 F (37 C), temperature source Oral, resp. rate 16, height 5\' 3"  (1.6 m), weight 76.2 kg, SpO2 93 %.  Medical Problem List and Plan: 1.  Right side weakness and slurred speech secondary to left pontine acute perforator infarction              -Daughter available for family ed , team conf in am   -ELOS 12/17   2.  Antithrombotics: -DVT/anticoagulation: Lovenox- will d/c on 12/17             -antiplatelet therapy: Aspirin and Plavix x3 weeks then aspirin alone 12/21 3. Pain Management: Tylenol as needed hypersenstive over the RIght lateral thigh and back of knee, had a fall with large lateral hip hematoma , , old blood products likely tracking down and causing some irritation.  -this has been discussed with pt and should improve with time  4. Mood: Provide emotional support             -antipsychotic agents: N/A 5. Neuropsych: This patient is capable of making decisions on her own behalf. 6. Skin/Wound Care: Routine skin checks 7. Fluids/Electrolytes/Nutrition: Routine in and outs with follow-up chemistries 8.  Diabetes mellitus.  Hemoglobin A1c 11.8.  non compliant with meds at home. Diabetic teaching CBG (last 3)  Recent Labs    08/26/19 1620  08/26/19 2153 08/27/19 0712  GLUCAP 128* 146* 155*    12/12 Increased mealtime covg to 5u  Continue 25u lantus increase to 30U - ? Need to switch to 70/30 12/13 observe with yesterday changes 9.  Hyperlipidemia.  Lipitor. 10. HTN: She was on no home medications, but is currently hypertensive. Neurology recommends permissive HTN for 1 week, in 2nd week of rehab will add hypertensive agent if BP still elevated.  Vitals:   08/26/19 2117 08/27/19 0545  BP: (!) 151/61 (!) 142/76  Pulse: 69 83  Resp: 16 16  Temp: 98 F (36.7 C) 98.6 F (37 C)  SpO2: 98% 93%  BP controlled 12/15 10. Education: Will require stroke prevention education and education regarding the importance of good control of her HTN and DM. She was noncompliant with DM medications at home. 11.  Insomnia schedule trazodone  12. Constipation: added senna-docusate.  -improved no supp needed    LOS: 15 days A FACE TO FACE EVALUATION WAS PERFORMED  Charlett Blake 08/27/2019, 8:53 AM

## 2019-08-27 NOTE — Plan of Care (Signed)
  Problem: Consults Goal: RH STROKE PATIENT EDUCATION Description: See Patient Education module for education specifics  Outcome: Progressing Goal: Diabetes Guidelines if Diabetic/Glucose > 140 Description: If diabetic or lab glucose is > 140 mg/dl - Initiate Diabetes/Hyperglycemia Guidelines & Document Interventions  Outcome: Progressing   Problem: RH BOWEL ELIMINATION Goal: RH STG MANAGE BOWEL WITH ASSISTANCE Description: STG Manage Bowel with mod I Assistance. Outcome: Progressing Goal: RH STG MANAGE BOWEL W/MEDICATION W/ASSISTANCE Description: STG Manage Bowel with Medication with mod I Assistance. Outcome: Progressing   Problem: RH BLADDER ELIMINATION Goal: RH STG MANAGE BLADDER WITH ASSISTANCE Description: STG Manage Bladder With mod I Assistance Outcome: Progressing   Problem: RH SKIN INTEGRITY Goal: RH STG SKIN FREE OF INFECTION/BREAKDOWN Description: Patient will have no acquired break down while on IP Rehab Outcome: Progressing Goal: RH STG MAINTAIN SKIN INTEGRITY WITH ASSISTANCE Description: STG Maintain Skin Integrity With mod I Assistance. Outcome: Progressing   Problem: RH PAIN MANAGEMENT Goal: RH STG PAIN MANAGED AT OR BELOW PT'S PAIN GOAL Description: Pain scale <2/10 Outcome: Progressing   Problem: RH KNOWLEDGE DEFICIT Goal: RH STG INCREASE KNOWLEDGE OF DIABETES Description: Patient will be able to verbalize the importance of good diabetes management Outcome: Progressing Goal: RH STG INCREASE KNOWLEDGE OF HYPERTENSION Description: Patient will be able to verbalize the risks of hypertension Outcome: Progressing Goal: RH STG INCREASE KNOWLEDGE OF STROKE PROPHYLAXIS Description: Patient will be able to verbalize her stoke prophylaxis medication Outcome: Progressing

## 2019-08-27 NOTE — Discharge Summary (Signed)
Physician Discharge Summary  Patient ID: Jacqueline Leach MRN: [id number] DOB/AGE: 1956/06/30 63 y.o.  Admit date: 08/12/2019 Discharge date: 08/29/2019  Discharge Diagnoses:  Active Problems:   Left pontine cerebrovascular accident [facility]) DVT prophylaxis Diabetes mellitus with peripheral neuropathy Hyperlipidemia Hypertension Insomnia Constipation  Discharged Condition: Stable  Significant Diagnostic Studies: MR BRAIN WO CONTRAST  Result Date: 08/09/2019 CLINICAL DATA:  Focal neuro deficit with stroke suspected EXAM: MRI HEAD WITHOUT CONTRAST TECHNIQUE: Multiplanar, multiecho pulse sequences of the brain and surrounding structures were obtained without intravenous contrast. COMPARISON:  None. FINDINGS: Brain: Wedge of restricted diffusion in the left pons consistent with acute infarct. Remote lacunar infarct in the right frontal parietal white matter with peripheral facilitated diffusion. Brain volume is normal. No hemorrhage, hydrocephalus, or masslike finding. Vascular: Normal flow voids Skull and upper cervical spine: Normal marrow signal. Cervical facet spurring with C4-5 facet ankylosis. Sinuses/Orbits: Normal IMPRESSION: 1. Left pontine acute perforator infarction. 2. Remote lacunar infarct in the right cerebral white matter. Electronically Signed   By: Monte Fantasia M.D.   On: 08/09/2019 17:13   DG CHEST PORT 1 VIEW  Result Date: 08/09/2019 CLINICAL DATA:  History of stroke.  Low-grade fever EXAM: PORTABLE CHEST 1 VIEW COMPARISON:  None. FINDINGS: Normal heart size. Negative mediastinal contours other than atherosclerotic calcification. There is no edema, consolidation, effusion, or pneumothorax. Artifact from EKG leads. IMPRESSION: No evidence of active disease. Electronically Signed   By: Monte Fantasia M.D.   On: 08/09/2019 18:57   DG Abd Portable 1V  Result Date: 08/13/2019 CLINICAL DATA:  Nausea and vomiting today. EXAM: PORTABLE ABDOMEN - 1 VIEW COMPARISON:  None.  FINDINGS: The abdominal bowel gas pattern is unremarkable. There is scattered air in the small bowel and colon but no findings to suggest obstruction or perforation. The soft tissue shadows are maintained. The bony structures are intact. IMPRESSION: No plain film findings for an acute abdominal process. Electronically Signed   By: Marijo Sanes M.D.   On: 08/13/2019 07:21   ECHOCARDIOGRAM COMPLETE  Result Date: 08/09/2019   ECHOCARDIOGRAM REPORT   Patient Name:   Jacqueline Leach Date of Exam: 08/09/2019 Medical Rec #:  [id number]      Height:       63.0 in Accession #:    [id number]     Weight:       168.9 lb Date of Birth:  12/05/1957      BSA:          1.80 m Patient Age:    74 years       BP:           124/56 mmHg Patient Gender: F              HR:           72 bpm. Exam Location:  Inpatient Procedure: 2D Echo, Cardiac Doppler and Color Doppler Indications:   Stroke 434.91  History:       Patient has no prior history of Echocardiogram examinations.                Stroke.  Sonographer:   Paulita Fujita RDCS Referring      [id number] [facility] R AROOR Phys: IMPRESSIONS  1. Left ventricular ejection fraction, by visual estimation, is 60 to 65%. The left ventricle has normal function. There is no left ventricular hypertrophy.  2. Left ventricular diastolic parameters are consistent with Grade I diastolic dysfunction (impaired relaxation).  3. Global right ventricle has normal systolic function.The right  ventricular size is normal.  4. Left atrial size was normal.  5. Right atrial size was normal.  6. Mild mitral annular calcification.  7. The mitral valve is normal in structure. Trace mitral valve regurgitation. No evidence of mitral stenosis.  8. The tricuspid valve is normal in structure. Tricuspid valve regurgitation is mild.  9. The aortic valve is tricuspid. Aortic valve regurgitation is not visualized. Mild aortic valve sclerosis without stenosis. 10. The pulmonic valve was normal in structure. Pulmonic valve  regurgitation is not visualized. 11. The inferior vena cava is normal in size with greater than 50% respiratory variability, suggesting right atrial pressure of 3 mmHg. 12. Normal LV systolic function; grade 1 diastolic dysfunction. FINDINGS  Left Ventricle: Left ventricular ejection fraction, by visual estimation, is 60 to 65%. The left ventricle has normal function. There is no left ventricular hypertrophy. Left ventricular diastolic parameters are consistent with Grade I diastolic dysfunction (impaired relaxation). Normal left atrial pressure. Right Ventricle: The right ventricular size is normal.Global RV systolic function is has normal systolic function. Left Atrium: Left atrial size was normal in size. Right Atrium: Right atrial size was normal in size Pericardium: There is no evidence of pericardial effusion. Mitral Valve: The mitral valve is normal in structure. Mild mitral annular calcification. No evidence of mitral valve stenosis by observation. Trace mitral valve regurgitation. Tricuspid Valve: The tricuspid valve is normal in structure. Tricuspid valve regurgitation is mild. Aortic Valve: The aortic valve is tricuspid. Aortic valve regurgitation is not visualized. Mild aortic valve sclerosis is present, with no evidence of aortic valve stenosis. Pulmonic Valve: The pulmonic valve was normal in structure. Pulmonic valve regurgitation is not visualized. Aorta: The aortic root is normal in size and structure. Venous: The inferior vena cava is normal in size with greater than 50% respiratory variability, suggesting right atrial pressure of 3 mmHg.  LEFT VENTRICLE PLAX 2D LVIDd:         4.20 cm  Diastology LVIDs:         2.99 cm  LV e' lateral:   10.40 cm/s LV PW:         0.96 cm  LV E/e' lateral: 7.5 LV IVS:        0.96 cm  LV e' medial:    7.29 cm/s LVOT diam:     2.00 cm  LV E/e' medial:  10.7 LV SV:         44 ml LV SV Index:   23.45 LVOT Area:     3.14 cm  RIGHT VENTRICLE RV S prime:     14.80 cm/s  TAPSE (M-mode): 1.7 cm LEFT ATRIUM             Index       RIGHT ATRIUM           Index LA diam:        4.00 cm 2.22 cm/m  RA Area:     11.00 cm LA Vol (A2C):   20.6 ml 11.45 ml/m RA Volume:   22.20 ml  12.34 ml/m LA Vol (A4C):   28.6 ml 15.89 ml/m LA Biplane Vol: 24.3 ml 13.50 ml/m  AORTIC VALVE LVOT Vmax:   109.00 cm/s LVOT Vmean:  77.700 cm/s LVOT VTI:    0.272 m  AORTA Ao Root diam: 3.10 cm MITRAL VALVE MV Area (PHT): 3.42 cm             SHUNTS MV PHT:        64.38 msec  Systemic VTI:  0.27 m MV Decel Time: 222 msec             Systemic Diam: 2.00 cm MV E velocity: 78.20 cm/s 103 cm/s MV A velocity: 99.50 cm/s 70.3 cm/s MV E/A ratio:  0.79       1.5  Kirk Ruths MD Electronically signed by Kirk Ruths MD Signature Date/Time: 08/09/2019/12:12:19 PM    Final    VAS US CAROTID  Result Date: 08/13/2019 Carotid Arterial Duplex Study Indications:       CVA and Speech disturbance. Risk Factors:      Hypertension, Diabetes. Comparison Study:  No priors Performing Technologist: Velva Harman Sturdivant RDMS, RVT  Examination Guidelines: A complete evaluation includes B-mode imaging, spectral Doppler, color Doppler, and power Doppler as needed of all accessible portions of each vessel. Bilateral testing is considered an integral part of a complete examination. Limited examinations for reoccurring indications may be performed as noted.  Right Carotid Findings: +----------+--------+--------+--------+------------------+------------------+           PSV cm/sEDV cm/sStenosisPlaque DescriptionComments           +----------+--------+--------+--------+------------------+------------------+ CCA Prox  109     28                                                   +----------+--------+--------+--------+------------------+------------------+ CCA Distal103     28                                                   +----------+--------+--------+--------+------------------+------------------+ ICA Prox   113     41      1-39%   heterogenous      Upper end of range +----------+--------+--------+--------+------------------+------------------+ ICA Mid   104     37      1-39%                                        +----------+--------+--------+--------+------------------+------------------+ ICA Distal131     48                                tortuous           +----------+--------+--------+--------+------------------+------------------+ ECA       119     18                                                   +----------+--------+--------+--------+------------------+------------------+ +----------+--------+-------+----------------+-------------------+           PSV cm/sEDV cmsDescribe        Arm Pressure (mmHG) +----------+--------+-------+----------------+-------------------+ [id number]            Multiphasic, WNL                    +----------+--------+-------+----------------+-------------------+ +---------+--------+--+--------+--+---------+ VertebralPSV cm/s65EDV cm/s22Antegrade +---------+--------+--+--------+--+---------+  Left Carotid Findings: +----------+--------+--------+--------+------------------+--------+           PSV cm/sEDV cm/sStenosisPlaque DescriptionComments +----------+--------+--------+--------+------------------+--------+ CCA Prox  139  30                                tortuous +----------+--------+--------+--------+------------------+--------+ CCA Distal100     31                                         +----------+--------+--------+--------+------------------+--------+ ICA Prox  103     39      1-39%   heterogenous               +----------+--------+--------+--------+------------------+--------+ ICA Distal112     24                                         +----------+--------+--------+--------+------------------+--------+ ECA       121     17                                          +----------+--------+--------+--------+------------------+--------+ +----------+--------+--------+----------------+-------------------+           PSV cm/sEDV cm/sDescribe        Arm Pressure (mmHG) +----------+--------+--------+----------------+-------------------+ [id number]             Multiphasic, WNL                    +----------+--------+--------+----------------+-------------------+ +---------+--------+--+--------+--+---------+ VertebralPSV cm/s53EDV cm/s20Antegrade +---------+--------+--+--------+--+---------+  Summary: Right Carotid: Velocities in the right ICA are consistent with a 1-39% stenosis. Left Carotid: Velocities in the left ICA are consistent with a 1-39% stenosis. Vertebrals: Bilateral vertebral arteries demonstrate antegrade flow. *See table(s) above for measurements and observations.  Electronically signed by Antony Contras MD on 08/13/2019 at 8:14:45 AM.    Final     Labs:  Basic Metabolic Panel: Recent Labs  Lab 08/26/19 0718  CREATININE 0.66    CBC: No results for input(s): WBC, NEUTROABS, HGB, HCT, MCV, PLT in the last 168 hours.  CBG: Recent Labs  Lab 08/27/19 2128 08/28/19 0625 08/28/19 1051 08/28/19 1640 08/28/19 2123  GLUCAP 177* 134* 230* 124* 117*   Family history.  Mother and father with history of hypertension hyperlipidemia and diabetes mellitus.  Denies any: Or rectal cancer   Brief HPI:   Jacqueline Leach is a 63 y.o. right-handed female with history of diabetes mellitus noncompliant with medications, hypertension on no antihypertensive medications.  Patient lives with her children assistance is needed.  Reportedly independent prior to admission.  Presented 08/09/2019 from [facility] with sudden onset of right-sided weakness resulting in a fall and slurred speech as well as hematoma to the right leg.  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  Remote lacunar infarct in the right cerebral white matter.   No hemorrhage or hydrocephalus noted.  TPA was initiated but stopped due to some hematemesis and hematoma of the right thigh.  Echocardiogram with ejection fraction of 65% without emboli.  Carotid Dopplers unremarkable.  CTA of the head at outside hospital showed no large vessel occlusion.  Admission chemistries SARS coronavirus negative, hemoglobin A1c of 11.8, WBC 10,900.  Neurology services consulted maintain on aspirin and Plavix for CVA prophylaxis x3 weeks and aspirin alone.  Subcutaneous Lovenox for DVT prophylaxis.  Patient was admitted  for a comprehensive rehab program.   Hospital Course: Taylia Peska was admitted to rehab 08/12/2019 for inpatient therapies to consist of PT, ST and OT at least three hours five days a week. Past admission physiatrist, therapy team and rehab RN have worked together to provide customized collaborative inpatient rehab.  Pertaining to patient left pontine acute perforator infarction remained stable she would follow-up with neurology services.  Aspirin and Plavix x3 weeks then aspirin alone.  Subcutaneous Lovenox for DVT prophylaxis discontinued 08/29/2019 as mobility improved no bleeding episodes as well as discontinued due to a large lateral hip hematoma old blood products noted tracking down and causing some irritation.  Blood sugars overall monitored hemoglobin A1c 11.8.  Patient maintained on Lantus insulin 30 units daily full diabetic teaching completed patient would need follow-up with primary MD.  She remained on Lipitor for hyperlipidemia.  Bouts of constipation resolved with laxative assistance.  Sleep disturbance with trazodone added with good results.   Blood pressures were monitored on TID basis and controlled  Diabetes has been monitored with ac/hs CBG checks and SSI was use prn for tighter BS control.   She is continent of bowel and bladder.  She has made gains during rehab stay and is attending therapies  She will continue to receive follow up  therapies   after discharge  Rehab course: During patient's stay in rehab weekly team conferences were held to monitor patient's progress, set goals and discuss barriers to discharge. At admission, patient required minimal assist stand pivot transfers, minimum to moderate assist squat pivot transfers, moderate assist 15 feet bilateral platform walker, moderate assist supine to sit.  Moderate assist grooming, moderate assist upper body bathing, max assist lower body bathing, mod assist upper body dressing, max assist lower body dressing, minimal assist toilet transfers  Physical exam.  Blood pressure 158/71 pulse 93 temperature 98.4 respirations 19 oxygen saturation 97% room air Neurological.  Alert follows commands oriented x3 cranial nerves II through XII intact except for mild right facial droop HEENT oral mucosa pink and moist Eyes.  Pupils round and reactive to light no discharge without nystagmus Cardiac regular rate rhythm without any extra sounds or murmur heard Respiratory effort normal normal rate of breathing no respiratory distress Abdomen.  Soft nontender positive bowel sounds without rebound Extremities.  No edema Musculoskeletal.  5 out of 5 strength on the left side right upper extremity 2 out of 5 elbow flexion wrist flexion right lower extremity 3 out of 5 hip flexion knee extension 0 out of 5 dorsi flexion.  He/  has had improvement in activity tolerance, balance, postural control as well as ability to compensate for deficits. He/ has had improvement in functional use RUE/LUE  and RLE/LLE as well as improvement in awareness.  Working with energy conservation techniques.  Patient ambulates to the Ortho gym supervision with rolling walker.  Participating in DynaVision for dynamic balance.  Participated in ascending and descending 3 steps without rails with rolling walker.  Patient goes to the bathroom wanting to perform bathing at shower level.  Able to doff clothing with supervision  and extra time.  Ambulates into the bathroom with supervision as well as performing toileting task sit to stand.  Patient may then shower sit to stand with a grab bar modified independent.  Patient transferred out of shower into regular chair to the bathroom and dressed sit to stand.  Full family teaching completed plan discharge to home       Disposition: Discharge disposition: 01-Home  or Self Care     Discharge to home   Diet: Carb modified diet  Special Instructions: No driving smoking or alcohol  Aspirin and Plavix x3 weeks then aspirin alone beginning 09/02/2019  Medications at discharge. 1.  Tylenol as needed 2.  Aspirin 81 mg p.o. daily 3.  Lipitor 80 mg p.o. daily 4.  Plavix 75 mg p.o. daily 5.  Lantus insulin 30 units subcutaneous daily 6.  Protonix 40 mg p.o. daily 7.  Senokot S1 tablet p.o. nightly hold for loose stools 8.  Trazodone 50 mg p.o. nightly  Discharge Instructions    Ambulatory referral to Neurology   Complete by: As directed    An appointment is requested in approximately 4 weeks left pontine infarction   Ambulatory referral to Physical Medicine Rehab   Complete by: As directed    Moderate complexity follow-up 1 to 2 weeks left pontine infarction      Follow-up Information    Kirsteins, Luanna Salk, MD Follow up.   Specialty: Physical Medicine and Rehabilitation Why: Office to call for appointment Contact information: [place] [address] [phone]           Signed: Cathlyn Parsons 08/29/2019, 5:18 AM

## 2019-08-27 NOTE — Progress Notes (Signed)
Physical Therapy Session Note  Patient Details  Name: Jacqueline Leach MRN: [id number] Date of Birth: 18-May-1956  Today's Date: 08/27/2019 PT Individual Time: 1100-1200 PT Individual Time Calculation (min): 60 min   Short Term Goals: Week 1:  PT Short Term Goal 1 (Week 1): Pt will negotiate 3 steps with LRAD & mod assist. PT Short Term Goal 1 - Progress (Week 1): Met PT Short Term Goal 2 (Week 1): Pt will ambulate 75 ft with LRAD & min assist. PT Short Term Goal 2 - Progress (Week 1): Met PT Short Term Goal 3 (Week 1): Pt will complete bed mobility consistently with min assist. PT Short Term Goal 3 - Progress (Week 1): Met PT Short Term Goal 4 (Week 1): Pt will complete bed<>w/c transfers consistently with CGA. PT Short Term Goal 4 - Progress (Week 1): Met Week 2:  PT Short Term Goal 1 (Week 2): Pt will ambulate 148f with min assist and LRAD PT Short Term Goal 2 (Week 2): [facility] PT Short Term Goal 3 (Week 2): Pt will maintain standing balance with 1 UE for >2 min with min assist for safety Week 3:     Skilled Therapeutic Interventions/Progress Updates:      Therapy Documentation Precautions:  Precautions Precautions: Fall Precaution Comments: R hemi Restrictions Weight Bearing Restrictions: No  PAIN post R knee w/pressure to area, no pain w/therapy. Pt initially seated in wc.  Focus of todays session gait and dynamic balance, RLE NMRE.  Pt sit to stand from wc w/ supervision and gait 2070fto gym w/Rw, RAFO, and close supervision, decreased clearance RLE w/decreassed hip and knee flexion.  Pt w/significantly decreased stance time RLE noted. Standing tapping 5in step w/RLE x 5, very difficult due to weakness, unable to maintain balance to perform w/L Repeated w/3inch step w/RLE x 5, continue to be unable to balance on R and perfom w/LLE  STS + sidestep + stand to sit length of mat each direction x 1 repeated x 2 for increased wbing /wt  shifting to R. Standing ball toss w/rebounder slightly to R to promote increase RLE wbing x 20reps Standing w/L foot on red rubber disk to promote wbing/wtshift to RLE 1 min, 1.41m58m 2 min w/cervical nods and rotation as progressed w/task, cga to min assist for balance.  Pt rated this task as very challenging.  Standing stepping R/reaching L and vice versa to clip clothespin w/opposite hand on basketball net at high setting  3 min x 2 wtactile facilitation of RUE elevation required 50% time, decreaseing dysmetria w/repittion.    wc propulsion [id number] w/bilat LE's only for bipedal task and R hamstring strengthening. Pt transported to room at end of session and left OOB in wc w/needs in reach.  Would benefit from continued RLE NMRE, dynamic balance/promoting wt shifting to RLE to promote efficiency of gait.  Therapy/Group: Individual Therapy  BarbCallie Fielding   BarbJerrilyn Cairo15/2020, 12:12 PM

## 2019-08-27 NOTE — Progress Notes (Signed)
Physical Therapy Session Note  Patient Details  Name: Jacqueline Leach MRN: [id number] Date of Birth: 07-18-1956  Today's Date: 08/27/2019 PT Individual Time: 0900-1000 PT Individual Time Calculation (min): 60 min   Short Term Goals: Week 2:  PT Short Term Goal 1 (Week 2): Pt will ambulate 152f with min assist and LRAD PT Short Term Goal 2 (Week 2): [facility] PT Short Term Goal 3 (Week 2): Pt will maintain standing balance with 1 UE for >2 min with min assist for safety  Skilled Therapeutic Interventions/Progress Updates: Pt presented in w/c agreeable to therapy. Pt denies pain during session. Pt ambulated to ortho gym supervision with RW. Participated in DIslefor dynamic balance and forced use of RUE. Pt was able to participate in modeB 2 minute trials x 3 total on compliant and non-compliant surfaces. Pt intermittently provided AA to RUE when reaching moderately challenging buttons. Pt transported to rehab gym and participated in ascending/descending x 3 steps without rails but with RW backwards with PTA supporting RW twice with CGA on first bouts and supervision on second bout. Participated in seated and standing LE therex as follows: LAQ 2 x 10 with 2lb on LLE, 1lb on RLE. STS no AD x 5, attempted standing hamstring curls however pt continues to have significant weakness against gravity therefore changed to seated hamstring pulls with use of washcloth to decrease friction, standing hip abd/add 2 x 10 with 1lb wt bilaterally. Pt then ambulated additional 1039ftowards room and transported remaining distance as noted decreased gait quality due to fatigue. Pt remained in w/c at end of session with seat alarm on, call bell within reach and needs met.      Therapy Documentation Precautions:  Precautions Precautions: Fall Precaution Comments: R hemi Restrictions Weight Bearing Restrictions: No General:   Vital Signs: Therapy Vitals Temp: 97.9 F  (36.6 C) Temp Source: Oral Pulse Rate: 79 Resp: 16 BP: 130/71 Patient Position (if appropriate): Sitting Oxygen Therapy SpO2: 97 % O2 Device: Room Air Therapy/Group: Individual Therapy  Jacqueline Leach  Jacqueline Leach, PTA  08/27/2019, 4:09 PM

## 2019-08-28 ENCOUNTER — Ambulatory Visit (HOSPITAL_COMMUNITY): Payer: Self-pay | Admitting: Physical Therapy

## 2019-08-28 ENCOUNTER — Inpatient Hospital Stay (HOSPITAL_COMMUNITY): Payer: Self-pay | Admitting: Physical Therapy

## 2019-08-28 ENCOUNTER — Inpatient Hospital Stay (HOSPITAL_COMMUNITY): Payer: Self-pay | Admitting: Occupational Therapy

## 2019-08-28 LAB — GLUCOSE, CAPILLARY
Glucose-Capillary: 134 mg/dL — ABNORMAL HIGH (ref 70–99)
Glucose-Capillary: 230 mg/dL — ABNORMAL HIGH (ref 70–99)
Glucose-Capillary: 124 mg/dL — ABNORMAL HIGH (ref 70–99)
Glucose-Capillary: 117 mg/dL — ABNORMAL HIGH (ref 70–99)

## 2019-08-28 MED ORDER — CLOPIDOGREL BISULFATE 75 MG PO TABS: 75.0000 mg | ORAL_TABLET | Freq: Every day | ORAL | 0 refills | Status: DC

## 2019-08-28 MED ORDER — TRAZODONE HCL 50 MG PO TABS: 50.0000 mg | ORAL_TABLET | Freq: Every day | ORAL | 0 refills | Status: DC

## 2019-08-28 MED ORDER — ACETAMINOPHEN 325 MG PO TABS: 650.0000 mg | ORAL_TABLET | ORAL | Status: AC | PRN

## 2019-08-28 MED ORDER — PANTOPRAZOLE SODIUM 40 MG PO TBEC: 40.0000 mg | DELAYED_RELEASE_TABLET | Freq: Every day | ORAL | 0 refills | Status: AC

## 2019-08-28 MED ORDER — INSULIN GLARGINE 100 UNIT/ML SOLOSTAR PEN: 30.0000 [IU] | PEN_INJECTOR | Freq: Every day | SUBCUTANEOUS | 11 refills | Status: DC

## 2019-08-28 MED ORDER — SENNOSIDES-DOCUSATE SODIUM 8.6-50 MG PO TABS: 1.0000 | ORAL_TABLET | Freq: Every day | ORAL | Status: AC

## 2019-08-28 MED ORDER — ATORVASTATIN CALCIUM 80 MG PO TABS: 80.0000 mg | ORAL_TABLET | Freq: Every day | ORAL | 0 refills | Status: DC

## 2019-08-28 NOTE — Progress Notes (Signed)
Physical Therapy Session Note  Patient Details  Name: Jacqueline Leach MRN: [id number] Date of Birth: 04-Jul-1956  Today's Date: 08/28/2019 PT Individual Time: 1125-1205 1300-1415 PT Individual Time Calculation (min): 40 min and 75 min  Short Term Goals: Week 2:  PT Short Term Goal 1 (Week 2): Pt will ambulate 187f with min assist and LRAD PT Short Term Goal 2 (Week 2): [facility] Short Term Goal 3 (Week 2): Pt will maintain standing balance with 1 UE for >2 min with min assist for safety  Skilled Therapeutic Interventions/Progress Updates: Tx1: Pt presented in w/c agreeable to therapy. Pt transported to ortho gym and performed car transfer, ramp, and ambulation on mulch with RW and close S with CGA on mulch. Pt then ambulated to rehab gym in preparation for stairs however inaccessible due to people in gym. Pt then ambulated to ADL apt and performed bed mobility from standard bed with supervision and verbal cues for technique. MS testing performed for final assessment, pt noted to be able to perform DF against gravity. Ambulated approx 1086fwith RW without AFO. Pt with no knee instability and although was able to clear R foot but inconsistently. Explained that with continued strengthening of DF and hamstrings should be able to ambulate in time without AFO. Pt overall pleased with progress. Pt transported remaining distance back to room and left with call bell within reach and needs met.   Tx2: Pt presented in w/c with dgt LoCecille Rubinresent agreeable to therapy. Session focused on family education with dgt. Provided edu that pt is primarily at supervision level, has overall good safety awareness but needs to monitor endurance promote energy conservation. PTA also provided eduction that pt has some DF return but would continue to benefit from use of AFO until improvement of RLE, pt and dgt verbalized understanding. Pt ambulated to rehab gym and PTA and pt  demonstrated ascending/descending stairs x 3 with RW only. After demonstration pt and dgt was able to perform with pt providing appropriate cues for technique. Pt then transported to ortho gym and pt and dgt ambulated up ramp and up mulch with pt providing cues as needed to dgt. Pt then performed floor transfer with CGA and significant effort. With trial and error deems it was better performed without AFO to allow for increased r foot mobility. PTA then provided pt HEP as noted below. PTA was able to answer any queries by dgt. Pt transported back to room at end of session and left with call bell within reach and needs met.   HEP LAQ 3 x 10 STS 2 x 5 Sidelying Clamshells 3 x 10 Seated hamstring pulls AROM 3 x 10 with progression to standing hamstring curls 3 x 10 Heel rises 3 x 10      Therapy Documentation Precautions:  Precautions Precautions: Fall Precaution Comments: R hemi Restrictions Weight Bearing Restrictions: No    Therapy/Group: Individual Therapy  Jaiyana Canale  Allora Bains, PTA  08/28/2019, 12:38 PM

## 2019-08-28 NOTE — Patient Care Conference (Signed)
Inpatient RehabilitationTeam Conference and Plan of Care Update Date: 08/28/2019   Time: 10:10 AM    Patient Name: Jacqueline Leach      Medical Record Number: [id number]  Date of Birth: October 15, 1955 Sex: Female         Room/Bed: 4W23C/4W23C-01 Payor Info: Payor: MEDICAID POTENTIAL / Plan: MEDICAID POTENTIAL / Product Type: *No Product type* /    Admit Date/Time:  08/12/2019  4:09 PM  Primary Diagnosis:  <principal problem not specified>  Patient Active Problem List   Diagnosis Date Noted  . Fall at home, initial encounter 08/12/2019  . Hematoma of right thigh 08/12/2019  . Hematemesis 08/12/2019  . Essential hypertension 08/12/2019  . Hyperlipidemia 08/12/2019  . Type II diabetes mellitus, uncontrolled ([place]) 08/12/2019  . Obesity 08/12/2019  . Noncompliance with medication regimen 08/12/2019  . Left pontine cerebrovascular accident ([place]) 08/12/2019  . Ischemic stroke ([place]) - L pontine d/t small vessel dz s/p IV tPA 08/09/2019    Expected Discharge Date: Expected Discharge Date: 08/29/19  Team Members Present: Physician leading conference: Dr. Alysia Penna Social Worker Present: Ovidio Kin, LCSW Nurse Present: Rosita Fire, RN Case Manager: Karene Fry, RN PT Present: Barrie Folk, PT;Rosita Dechalus, PTA OT Present: Willeen Cass, OT SLP Present: Charolett Bumpers, SLP PPS Coordinator present : Gunnar Fusi, SLP     Current Status/Progress Goal Weekly Team Focus  Bowel/Bladder   pt continent of B/B, LMB 08/27/2019 , pt refuses senokot during night shift  remain continent  assess tolieting q shift and prn   Swallow/Nutrition/ Hydration             ADL's   Supervision-Min A overall at shower level, ambulating with RW  supervision  ADL retraining, standing balance, NMR, d/c planning   Mobility   supervision for transfers, and gait >371ft on level time, CGA gait on uneven surfaces. CGA to close S ascending/descending x 3 steps with RW and second person managing RW   supervision overall except min assist 3 steps without rails with LRAD  balance, endurance, family ed   Communication             Safety/Cognition/ Behavioral Observations            Pain   has c/o pain, has prns  keep pain less than 3  asses pain q shift and prn   Skin   ecchymosis on right thigh due to fall at [employer] futher skin breakdown  asses skin q shift and prn    Rehab Goals Patient on target to meet rehab goals: Yes *See Care Plan and progress notes for long and short-term goals.     Barriers to Discharge  Current Status/Progress Possible Resolutions Date Resolved   Nursing                  PT                    OT                  SLP                SW                Discharge Planning/Teaching Needs:  Daughter to be here to finish up family education, reached goals of supervision level. Daughter's to provide 24 hr supervision. Pt to follow up with [facility] for medical follow up      Team Discussion: DM good, diarrhea  resolved, med stable.  RN BS 134, fam ed with Dtr today? OT goal level with ADLs at S.  PT goal level S, stairs with RW practiced.   Revisions to Treatment Plan: N/A     Medical Summary Current Status: good diabetic control, good BP controlled Weekly Focus/Goal: D/C planning  Barriers to Discharge: Medical stability   Possible Resolutions to Barriers: Discharge education   Continued Need for Acute Rehabilitation Level of Care: The patient requires daily medical management by a physician with specialized training in physical medicine and rehabilitation for the following reasons: Direction of a multidisciplinary physical rehabilitation program to maximize functional independence : Yes Medical management of patient stability for increased activity during participation in an intensive rehabilitation regime.: Yes Analysis of laboratory values and/or radiology reports with any subsequent need for medication adjustment and/or medical  intervention. : Yes   I attest that I was present, lead the team conference, and concur with the assessment and plan of the team.   Retta Diones 08/28/2019, 3:13 PM  Team conference was held via web/ teleconference due to [place] - 19

## 2019-08-28 NOTE — Progress Notes (Signed)
Recreational Therapy Session Note  Patient Details  Name: Jacqueline Leach MRN: [id number] Date of Birth: 06/13/1956 Today's Date: 08/28/2019  Pain: no c/o Skilled Therapeutic Interventions/Progress Updates: Met with pt to discuss discharge planning, use of leisure time & follow up on relaxation strategies.  Pt stated excitement and feeling prepared for discharge.  Pt reviewed relaxation strategies including leisure participation.  Pt also shared that she had used and enjoyed aromatherapy in the past.  No further TR.  Marland KitchenGallatin 08/28/2019, 3:59 PM

## 2019-08-28 NOTE — Progress Notes (Signed)
Physical Therapy Discharge Summary  Patient Details  Name: Jacqueline Leach MRN: [id number] Date of Birth: April 23, 1956  Today's Date: 08/28/2019    Patient has met 8 of 8 long term goals due to improved activity tolerance, improved balance, increased strength, improved attention, improved awareness and improved coordination.  Patient to discharge at an ambulatory level Supervision.   Patient's care partner is independent to provide the necessary physical assistance at discharge.  Reasons goals not met: N/A al goals met  Recommendation:  Patient will benefit from ongoing skilled PT services in home health setting to continue to advance safe functional mobility, address ongoing impairments in transfers, balance, strength, coordination, safety, and minimize fall risk.  Equipment: RW  Reasons for discharge: treatment goals met  Patient/family agrees with progress made and goals achieved: Yes  PT Discharge Precautions/Restrictions Precautions Precautions: Fall Precaution Comments: R hemi Restrictions Weight Bearing Restrictions: No Vital Signs Therapy Vitals Temp: 97.8 F (36.6 C) Pulse Rate: 93 Resp: 20 BP: 130/83 Patient Position (if appropriate): Sitting Oxygen Therapy SpO2: 97 % O2 Device: Room Air Vision/Perception  Vision - Assessment Eye Alignment: Within Functional Limits Ocular Range of Motion: Within Functional Limits Alignment/Gaze Preference: Within Defined Limits Perception Perception: Within Functional Limits Praxis Praxis: Intact  Cognition Overall Cognitive Status: Within Functional Limits for tasks assessed Arousal/Alertness: Awake/alert Orientation Level: Oriented X4 Memory: Appears intact Immediate Memory Recall: Sock;Blue;Bed Memory Recall Sock: Without Cue Memory Recall Blue: Without Cue Memory Recall Bed: Without Cue Awareness: Appears intact Problem Solving: Appears intact Safety/Judgment: Appears intact Sensation Sensation Light Touch:  Appears Intact Proprioception: Appears Intact Coordination Gross Motor Movements are Fluid and Coordinated: No Fine Motor Movements are Fluid and Coordinated: Yes Coordination and Movement Description: improved coordination with right UE Motor  Motor Motor: Within Functional Limits Motor - Discharge Observations: improved hemiparesis  Mobility Bed Mobility Bed Mobility: Supine to Sit;Sit to Supine Rolling Right: Independent with assistive device Supine to Sit: Independent with assistive device Sit to Supine: Independent with assistive device Transfers Transfers: Stand to Sit;Sit to Stand Sit to Stand: Supervision/Verbal cueing Stand to Sit: Supervision/Verbal cueing Stand Pivot Transfers: Supervision/Verbal cueing Transfer (Assistive device): Rolling walker Locomotion  Gait Ambulation: Yes Gait Assistance: Supervision/Verbal cueing Gait Distance (Feet): 150 Feet Assistive device: Rolling walker Gait Assistance Details: use of R AFO Gait Gait: Yes Gait Pattern: Impaired Gait Pattern: Step-through pattern;Decreased dorsiflexion - right;Poor foot clearance - right Gait velocity: decreased Stairs / Additional Locomotion Stairs: No Ramp: Supervision/Verbal cueing Curb: Supervision/Verbal cueing Wheelchair Mobility Wheelchair Mobility: No  Trunk/Postural Assessment  Cervical Assessment Cervical Assessment: Within Functional Limits Thoracic Assessment Thoracic Assessment: Exceptions to WFL(rounded shoulders) Lumbar Assessment Lumbar Assessment: Within Functional Limits Postural Control Postural Control: Within Functional Limits Righting Reactions: improved Protective Responses: improved  Balance Balance Balance Assessed: Yes Static Sitting Balance Static Sitting - Balance Support: Feet unsupported Static Sitting - Level of Assistance: 6: Modified independent (Device/Increase time) Dynamic Sitting Balance Sitting balance - Comments: mod I Dynamic Standing  Balance Dynamic Standing - Balance Support: No upper extremity supported;During functional activity Dynamic Standing - Level of Assistance: 5: Stand by assistance Dynamic Standing - Balance Activities: Ball toss Dynamic Standing - Comments: use of rebounder Extremity Assessment  RUE Assessment Passive Range of Motion (PROM) Comments: WFLs Active Range of Motion (AROM) Comments: WFL General Strength Comments: 4-/5 throughout improved coordination LUE Assessment LUE Assessment: Within Functional Limits RLE Assessment RLE Assessment: Exceptions to [facility] RLE Strength Right Hip Flexion: 4-/5 Right Hip ABduction: 4-/5 Right Knee Flexion: 3+/5 Right  Knee Extension: 3-/5 Right Ankle Dorsiflexion: 3/5 LLE Assessment LLE Assessment: Within Functional Limits    Rosita DeChalus 08/28/2019, 4:29 PM

## 2019-08-28 NOTE — Progress Notes (Signed)
Social Work Patient ID: Jacqueline Leach, female   DOB: [birth year], 63 y.o.   MRN: [id number] Daughter here to finish up family training and prepare for discharge tomorrow. Home health arranged and equipment to be delivered today to room. Information given for [facility] to obtain PCP until Medicare received in 11/2019. Pt very pleased with her progress and ready to go home tomorrow. Will have 24 hr supervision via daughter's.

## 2019-08-28 NOTE — Progress Notes (Signed)
[place] PHYSICAL MEDICINE & REHABILITATION PROGRESS NOTE   Subjective/Complaints:  Discused diabetic meds, currently good control discussed option of 70/30 from a cost standpoint   ROS: Patient denies, blurred vision, nausea, vomiting, diarrhea, cough, shortness of breath or chest pain,      Objective:   No results found. No results for input(s): WBC, HGB, HCT, PLT in the last 72 hours. Recent Labs    08/26/19 0718  CREATININE 0.66    Intake/Output Summary (Last 24 hours) at 08/28/2019 0904 Last data filed at 08/28/2019 0730 Gross per 24 hour  Intake 596 ml  Output --  Net 596 ml     Physical Exam: Vital Signs Blood pressure 138/62, pulse 80, temperature 98.8 F (37.1 C), temperature source Oral, resp. rate 20, height _0  (1.6 m), weight 76.2 kg, SpO2 94 %.   Constitutional: No distress . Vital signs reviewed. HEENT: EOMI, oral membranes moist Neck: supple Cardiovascular: RRR without murmur. No JVD    Respiratory: CTA Bilaterally without wheezes or rales. Normal effort    GI: BS +, non-tender, non-distended  eezes or rales. Normal effort    GI: BS +, non-tender, non-distended   Extremities: No clubbing, cyanosis, or edema Skin: No evidence of breakdown, no evidence of rash Neurologic: Cranial nerves II through XII intact, motor strength is 5/5 in LEFT  deltoid, bicep, tricep, grip, hip flexor, knee extensors, ankle dorsiflexor and plantar flexor 3- RIght biceps and delt, 3- Right Knee ext , 2-/5 in Right ankle DF/PF, trace Right toe flexion and ext  And 3-  HF --no changes    Sensory exam normal sensation to light touch and proprioception in bilateral upper and lower extremities Musculoskeletal: Full range of motion in all 4 extremities. No joint swelling, mild tenderness to light palpation RIght pop space, lateral thigh   Assessment/Plan: 1. 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 which require 3+ hours per day of interdisciplinary therapy  in a comprehensive inpatient rehab setting.  Physiatrist is providing close team supervision and 24 hour management of active medical problems listed below.  Physiatrist and rehab team continue to assess barriers to discharge/monitor patient progress toward functional and medical goals  Care Tool:  Bathing    Body parts bathed by patient: Right arm, Chest, Abdomen, Right upper leg, Left upper leg, Face, Front perineal area, Buttocks, Left arm, Left lower leg   Body parts bathed by helper: Right lower leg     Bathing assist Assist Level: Minimal Assistance - Patient > 75%     Upper Body Dressing/Undressing Upper body dressing   What is the patient wearing?: Pull over shirt, Bra    Upper body assist Assist Level: Supervision/Verbal cueing    Lower Body Dressing/Undressing Lower body dressing      What is the patient wearing?: Underwear/pull up, Pants     Lower body assist Assist for lower body dressing: Supervision/Verbal cueing     Toileting Toileting    Toileting assist Assist for toileting: Supervision/Verbal cueing     Transfers Chair/bed transfer  Transfers assist  Chair/bed transfer activity did not occur: Safety/medical concerns  Chair/bed transfer assist level: Supervision/Verbal cueing     Locomotion Ambulation   Ambulation assist   Ambulation activity did not occur: Safety/medical concerns  Assist level: Supervision/Verbal cueing Assistive device: Walker-rolling(AFO) Max distance: 220   Walk 10 feet activity   Assist  Walk 10 feet activity did not occur: Safety/medical concerns  Assist level: Supervision/Verbal cueing Assistive device: Walker-rolling   Walk  50 feet activity   Assist Walk 50 feet with 2 turns activity did not occur: Safety/medical concerns  Assist level: Supervision/Verbal cueing Assistive device: Walker-rolling    Walk 150 feet activity   Assist Walk 150 feet activity did not occur: Safety/medical  concerns  Assist level: Supervision/Verbal cueing Assistive device: Walker-rolling    Walk 10 feet on uneven surface  activity   Assist Walk 10 feet on uneven surfaces activity did not occur: Safety/medical concerns         Wheelchair     Assist   Type of Wheelchair: Manual Wheelchair activity did not occur: Safety/medical concerns  Wheelchair assist level: Supervision/Verbal cueing Max wheelchair distance: 150    Wheelchair 50 feet with 2 turns activity    Assist    Wheelchair 50 feet with 2 turns activity did not occur: Safety/medical concerns   Assist Level: Supervision/Verbal cueing   Wheelchair 150 feet activity     Assist  Wheelchair 150 feet activity did not occur: Safety/medical concerns   Assist Level: Supervision/Verbal cueing   Blood pressure 138/62, pulse 80, temperature 98.8 F (37.1 C), temperature source Oral, resp. rate 20, height _0  (1.6 m), weight 76.2 kg, SpO2 94 %.  Medical Problem List and Plan: 1.  Right side weakness and slurred speech secondary to left pontine acute perforator infarction  Team conference today please see physician documentation under team conference tab, met with team face-to-face to discuss problems,progress, and goals. Formulized individual treatment plan based on medical history, underlying problem and comorbidities.  -ELOS 12/17   2.  Antithrombotics: -DVT/anticoagulation: Lovenox- will d/c on 12/17             -antiplatelet therapy: Aspirin and Plavix x3 weeks then aspirin alone 12/21 3. Pain Management: Tylenol as needed hypersenstive over the RIght lateral thigh and back of knee, had a fall with large lateral hip hematoma , , old blood products likely tracking down and causing some irritation.  -this has been discussed with pt and should improve with time  4. Mood: Provide emotional support             -antipsychotic agents: N/A 5. Neuropsych: This patient is capable of making decisions on her own  behalf. 6. Skin/Wound Care: Routine skin checks 7. Fluids/Electrolytes/Nutrition: Routine in and outs with follow-up chemistries 8.  Diabetes mellitus.  Hemoglobin A1c 11.8.  non compliant with meds at home. Diabetic teaching CBG (last 3)  Recent Labs    08/27/19 1624 08/27/19 2128 08/28/19 0625  GLUCAP 127* 177* 134*   Well controled on Lantus and novalog, will have 1 mo pt assistance then f/u at [facility] clinic, Medicare kicks in March 2021   9.  Hyperlipidemia.  Lipitor. 10. HTN: She was on no home medications, but is currently hypertensive. Neurology recommends permissive HTN for 1 week, in 2nd week of rehab will add hypertensive agent if BP still elevated.  Vitals:   08/27/19 1938 08/28/19 0451  BP: (!) 146/68 138/62  Pulse: 75 80  Resp: 18 20  Temp: 98 F (36.7 C) 98.8 F (37.1 C)  SpO2: 99% 94%  BP controlled 12/16 10. Education: Will require stroke prevention education and education regarding the importance of good control of her HTN and DM. She was noncompliant with DM medications at home. 11.  Insomnia schedule trazodone  12. Constipation: added senna-docusate.  -improved no supp needed    LOS: 16 days A FACE TO FACE EVALUATION WAS PERFORMED  Charlett Blake 08/28/2019, 9:04 AM

## 2019-08-28 NOTE — Plan of Care (Signed)
  Problem: Consults Goal: RH STROKE PATIENT EDUCATION Description: See Patient Education module for education specifics  Outcome: Progressing Goal: Diabetes Guidelines if Diabetic/Glucose > 140 Description: If diabetic or lab glucose is > 140 mg/dl - Initiate Diabetes/Hyperglycemia Guidelines & Document Interventions  Outcome: Progressing   Problem: RH KNOWLEDGE DEFICIT Goal: RH STG INCREASE KNOWLEDGE OF DIABETES Description: Patient will be able to verbalize the importance of good diabetes management Outcome: Progressing Goal: RH STG INCREASE KNOWLEDGE OF HYPERTENSION Description: Patient will be able to verbalize the risks of hypertension Outcome: Progressing Goal: RH STG INCREASE KNOWLEDGE OF STROKE PROPHYLAXIS Description: Patient will be able to verbalize her stoke prophylaxis medication Outcome: Progressing   Problem: RH BOWEL ELIMINATION Goal: RH STG MANAGE BOWEL WITH ASSISTANCE Description: STG Manage Bowel with mod I Assistance. Outcome: Completed/Met Goal: RH STG MANAGE BOWEL W/MEDICATION W/ASSISTANCE Description: STG Manage Bowel with Medication with mod I Assistance. Outcome: Completed/Met   Problem: RH BLADDER ELIMINATION Goal: RH STG MANAGE BLADDER WITH ASSISTANCE Description: STG Manage Bladder With mod I Assistance Outcome: Completed/Met   Problem: RH SKIN INTEGRITY Goal: RH STG SKIN FREE OF INFECTION/BREAKDOWN Description: Patient will have no acquired break down while on IP Rehab Outcome: Completed/Met Goal: RH STG MAINTAIN SKIN INTEGRITY WITH ASSISTANCE Description: STG Maintain Skin Integrity With mod I Assistance. Outcome: Completed/Met   Problem: RH PAIN MANAGEMENT Goal: RH STG PAIN MANAGED AT OR BELOW PT'S PAIN GOAL Description: Pain scale <2/10 Outcome: Completed/Met

## 2019-08-28 NOTE — Progress Notes (Signed)
Occupational Therapy Discharge Summary  Patient Details  Name: Jacqueline Leach MRN: [id number] Date of Birth: 1956-01-13  Today's Date: 08/28/2019 OT Individual Time: 1415-1500 OT Individual Time Calculation (min): 45 min   1:1 Focus on family education with pt's daughter on pt's performance and recommendations for functional mobility in the bathroom and with ADLs. Pt bathes at shower level and can enter in and out of shower with supervision. Pt can don and doff clothing with supervision including AFO. Discussed energy conservation and safety in the home. Also went down to the ADL apartment and simulated making coffee and recommendations for set up for meal prep in the kitchen. Also discussed transporting items from kitchen to dinning room / living room. Discussed the possibility of her getting a tray for her walker to transport items; looked up different options online.  Pt ambulated from ADL apartment back to her room with RW with supervision.    Patient has met 11 of 11 long term goals due to improved activity tolerance, improved balance, postural control, ability to compensate for deficits, functional use of  RIGHT upper and RIGHT lower extremity and improved coordination.  Patient to discharge at overall Supervision level.  Patient's care partner is independent to provide the necessary physical assistance at discharge.    Reasons goals not met: n/a  Recommendation:  Patient will benefit from ongoing skilled OT services in home health setting to continue to advance functional skills in the area of BADL and Reduce care partner burden.  Equipment: shower chair  Reasons for discharge: treatment goals met and discharge from hospital  Patient/family agrees with progress made and goals achieved: Yes  OT Discharge Precautions/Restrictions  Precautions Precautions: Fall Precaution Comments: R hemi Restrictions Weight Bearing Restrictions: No General Chart Reviewed: Yes Family/Caregiver  Present: Yes Vital Signs Therapy Vitals Temp: 97.8 F (36.6 C) Pulse Rate: 93 Resp: 20 BP: 130/83 Patient Position (if appropriate): Sitting Oxygen Therapy SpO2: 97 % O2 Device: Room Air Pain  no c/o pain  ADL ADL Eating: Independent Where Assessed-Eating: Wheelchair Grooming: Modified independent Where Assessed-Grooming: Standing at sink Upper Body Bathing: Setup Where Assessed-Upper Body Bathing: Shower Lower Body Bathing: Setup Where Assessed-Lower Body Bathing: Shower Upper Body Dressing: Setup Where Assessed-Upper Body Dressing: Chair Lower Body Dressing: Setup Where Assessed-Lower Body Dressing: Chair Toileting: Supervision/safety Where Assessed-Toileting: Glass blower/designer: Distant supervision Armed forces technical officer Method: Magazine features editor: Close supervision Social research officer, government Method: Heritage manager: Civil engineer, contracting with back Vision Baseline Vision/History: No visual deficits Patient Visual Report: No change from baseline Eye Alignment: Within Functional Limits Ocular Range of Motion: Within Functional Limits Alignment/Gaze Preference: Within Defined Limits Perception  Perception: Within Functional Limits Praxis Praxis: Intact Cognition Overall Cognitive Status: Within Functional Limits for tasks assessed Arousal/Alertness: Awake/alert Orientation Level: Oriented X4 Memory: Appears intact Immediate Memory Recall: Sock;Blue;Bed Memory Recall Sock: Without Cue Memory Recall Blue: Without Cue Memory Recall Bed: Without Cue Awareness: Appears intact Problem Solving: Appears intact Safety/Judgment: Appears intact Sensation Sensation Light Touch: Appears Intact Proprioception: Appears Intact Coordination Gross Motor Movements are Fluid and Coordinated: No Fine Motor Movements are Fluid and Coordinated: Yes Coordination and Movement Description: improved coordination with right UE Motor  Motor Motor - Discharge  Observations: improved hemiparesis Mobility  Transfers Sit to Stand: Supervision/Verbal cueing Stand to Sit: Supervision/Verbal cueing  Trunk/Postural Assessment  Cervical Assessment Cervical Assessment: Within Functional Limits Thoracic Assessment Thoracic Assessment: (rounded shoulders) Lumbar Assessment Lumbar Assessment: Within Functional Limits Postural Control Righting Reactions: improved Protective Responses: improved  Balance Static Sitting Balance Static Sitting - Level of Assistance: 6: Modified independent (Device/Increase time) Dynamic Sitting Balance Sitting balance - Comments: mod I Dynamic Standing Balance Dynamic Standing - Level of Assistance: 5: Stand by assistance Extremity/Trunk Assessment RUE Assessment Passive Range of Motion (PROM) Comments: WFLs Active Range of Motion (AROM) Comments: WFL General Strength Comments: 4-/5 throughout improved coordination LUE Assessment LUE Assessment: Within Functional Limits   Willeen Cass [facility] 08/28/2019, 3:19 PM

## 2019-08-28 NOTE — Discharge Instructions (Signed)
Inpatient Rehab Discharge Instructions  Jacqueline Leach Discharge date and time: No discharge date for patient encounter.   Activities/Precautions/ Functional Status: Activity: activity as tolerated Diet: diabetic diet Wound Care: none needed Functional status:  ___ No restrictions     ___ Walk up steps independently ___ 24/7 supervision/assistance   ___ Walk up steps with assistance ___ Intermittent supervision/assistance  ___ Bathe/dress independently ___ Walk with walker     _x__ Bathe/dress with assistance ___ Walk Independently    ___ Shower independently ___ Walk with assistance    ___ Shower with assistance ___ No alcohol     ___ Return to work/school ________  Special Instructions: No driving smoking or alcohol  Continue aspirin 81 mg daily and Plavix 75 mg daily x3 weeks then aspirin alone   COMMUNITY REFERRALS UPON DISCHARGE:    Home Health:   PT & OT  Agency:[facility]   [place]   Date of last service:08/29/2019  Medical Equipment/Items [place]  Agency/Supplier:[facility]  [phone]  Other: MATCH GIVEN FOR PRESCRIPTION ASSIST AND WILL FOLLOW UP WITH [place] PCP  STROKE/TIA DISCHARGE INSTRUCTIONS SMOKING Cigarette smoking nearly doubles your risk of having a stroke & is the single most alterable risk factor  If you smoke or have smoked in the last 12 months, you are advised to quit smoking for your health.  Most of the excess cardiovascular risk related to smoking disappears within a year of stopping.  Ask you doctor about anti-smoking medications  [place] Quit Line: 1-800-QUIT NOW  Free Smoking Cessation Classes [phone]  CHOLESTEROL Know your levels; limit fat & cholesterol in your diet  Lipid Panel     Component Value Date/Time   CHOL 147 08/09/2019 0503   TRIG 82 08/09/2019 0503   HDL 28 (L) 08/09/2019 0503   CHOLHDL 5.3 08/09/2019 0503   VLDL 16 08/09/2019 0503   LDLCALC 103 (H)  08/09/2019 0503      Many patients benefit from treatment even if their cholesterol is at goal.  Goal: Total Cholesterol (CHOL) less than 160  Goal:  Triglycerides (TRIG) less than 150  Goal:  HDL greater than 40  Goal:  LDL (LDLCALC) less than 100   BLOOD PRESSURE American Stroke Association blood pressure target is less that 120/80 mm/Hg  Your discharge blood pressure is:  BP: (!) 156/74  Monitor your blood pressure  Limit your salt and alcohol intake  Many individuals will require more than one medication for high blood pressure  DIABETES (A1c is a blood sugar average for last 3 months) Goal HGBA1c is under 7% (HBGA1c is blood sugar average for last 3 months)  Diabetes:   Lab Results  Component Value Date   HGBA1C 11.8 (H) 08/09/2019     Your HGBA1c can be lowered with medications, healthy diet, and exercise.  Check your blood sugar as directed by your physician  Call your physician if you experience unexplained or low blood sugars.  PHYSICAL ACTIVITY/REHABILITATION Goal is 30 minutes at least 4 days per week  Activity: Increase activity slowly, Therapies: Physical Therapy: Home Health Return to work:   Activity decreases your risk of heart attack and stroke and makes your heart stronger.  It helps control your weight and blood pressure; helps you relax and can improve your mood.  Participate in a regular exercise program.  Talk with your doctor about the best form of exercise for you (dancing, walking, swimming, cycling).  DIET/WEIGHT Goal is to maintain  a healthy weight  Your discharge diet is:  Diet Order            Diet heart healthy/carb modified Room service appropriate? Yes with Assist; Fluid consistency: Thin  Diet effective now              liquids Your height is:  Height: 5\' 3"  (160 cm) Your current weight is: Weight: 75.7 kg Your Body Mass Index (BMI) is:  BMI (Calculated): 29.57  Following the type of diet specifically designed for you will help  prevent another stroke.  Your goal weight range is:    Your goal Body Mass Index (BMI) is 19-24.  Healthy food habits can help reduce 3 risk factors for stroke:  High cholesterol, hypertension, and excess weight.  RESOURCES Stroke/Support Group:  Call [phone]   STROKE EDUCATION PROVIDED/REVIEWED AND GIVEN TO PATIENT Stroke warning signs and symptoms How to activate emergency medical system (call 911). Medications prescribed at discharge. Need for follow-up after discharge. Personal risk factors for stroke. Pneumonia vaccine given:  Flu vaccine given:  My questions have been answered, the writing is legible, and I understand these instructions.  I will adhere to these goals & educational materials that have been provided to me after my discharge from the hospital.      My questions have been answered and I understand these instructions. I will adhere to these goals and the provided educational materials after my discharge from the hospital.  Patient/Caregiver Signature _______________________________ Date __________  Clinician Signature _______________________________________ Date __________  Please bring this form and your medication list with you to all your follow-up doctor's appointments.

## 2019-08-28 NOTE — Progress Notes (Signed)
Social Work Discharge Note   The overall goal for the admission was met for:   Discharge location: Yes-HOME WITH DAUGHTER'S  Length of Stay: Yes-17 DAYS  Discharge activity level: Yes-SUPERVISION LEVEL  Home/community participation: Yes  Services provided included: MD, RD, PT, OT, RN, CM, Pharmacy, Neuropsych and SW  Financial Services: Other: PENDING MEDICAID  Follow-up services arranged: Home Health: [place], DME: [facility] and Patient/Family has no preference for HH/DME agencies  Comments (or additional information):PT DID VERY WELL AND PROGRESSED TO SUPERVISION LEVEL. BETWEEN TWO DAUGHTER'S SHE WILL HAVE 24 HR SUPERVISION. MATCH GIVEN FOR PRESCRIPTION ASSISTANCE AND INFORMATION GIVEN ON [facility] TO OBTAIN PCP UNTIL SHE RECEIVES MEDICARE 11/2019.   Patient/Family verbalized understanding of follow-up arrangements: Yes  Individual responsible for coordination of the follow-up plan: SELF & [name]-DAUGHTER  Confirmed correct DME delivered: Elease Hashimoto 08/28/2019    Elease Hashimoto

## 2019-08-29 LAB — GLUCOSE, CAPILLARY: Glucose-Capillary: 149 mg/dL — ABNORMAL HIGH (ref 70–99)

## 2019-08-29 MED ORDER — BLOOD GLUCOSE METER KIT: PACK | 0 refills | Status: DC

## 2019-08-29 NOTE — Progress Notes (Signed)
Patient discharged to home per wheelchair to be accompanied by NT and daughter; Discharge instructions done by [facility] PA. No further questions noted all meds given.

## 2019-08-29 NOTE — Progress Notes (Signed)
Recreational Therapy Discharge Summary Patient Details  Name: Jacqueline Leach MRN: [id number] Date of Birth: June 25, 1956 Today's Date: 08/29/2019  Long term goals set: 1  Long term goals met: 1  Comments on progress toward goals: Pt has made great progress during LOS and is ready for discharge home with family at Mod I for TR tasks.  Education provided on activity analysis identifying potential modifications, emphasis on interactions with her young grandson as pt expressed concern about doing activities with him.  Also discussed relaxation strategies including leisure participation, diaphragmatic breathing & aroma therapy.  Handouts provided.  Pt is excited about discharge home with family. Reasons for discharge: discharge from hospital  Patient/family agrees with progress made and goals achieved: Yes  Tonya Wantz 08/29/2019, 2:50 PM

## 2019-08-29 NOTE — Progress Notes (Signed)
[facility] PHYSICAL MEDICINE & REHABILITATION PROGRESS NOTE   Subjective/Complaints:  No new issues overnight.  Had a good session with family training yesterday.  Daughter will be getting her today  ROS: Patient denies, blurred vision, nausea, vomiting, diarrhea, cough, shortness of breath or chest pain,      Objective:   No results found. No results for input(s): WBC, HGB, HCT, PLT in the last 72 hours. No results for input(s): NA, K, CL, CO2, GLUCOSE, BUN, CREATININE, CALCIUM in the last 72 hours.  Intake/Output Summary (Last 24 hours) at 08/29/2019 0934 Last data filed at 08/28/2019 2300 Gross per 24 hour  Intake 930 ml  Output -  Net 930 ml     Physical Exam: Vital Signs Blood pressure (!) 131/58, pulse 73, temperature 98.5 F (36.9 C), resp. rate 18, height 5\' 3"  (1.6 m), weight 76.2 kg, SpO2 96 %.   Constitutional: No distress . Vital signs reviewed. HEENT: EOMI, oral membranes moist Neck: supple Cardiovascular: RRR without murmur. No JVD    Respiratory: CTA Bilaterally without wheezes or rales. Normal effort    GI: BS +, non-tender, non-distended  eezes or rales. Normal effort    GI: BS +, non-tender, non-distended   Extremities: No clubbing, cyanosis, or edema Skin: No evidence of breakdown, no evidence of rash Neurologic: Cranial nerves II through XII intact, motor strength is 5/5 in LEFT  deltoid, bicep, tricep, grip, hip flexor, knee extensors, ankle dorsiflexor and plantar flexor 3- RIght biceps and delt, 3- Right Knee ext , 2-/5 in Right ankle DF/PF, trace Right toe flexion and ext  And 3-  HF --no changes    Sensory exam normal sensation to light touch and proprioception in bilateral upper and lower extremities Musculoskeletal: Full range of motion in all 4 extremities. No joint swelling, mild tenderness to light palpation RIght pop space, lateral thigh   Assessment/Plan: 1. 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  Stable for D/C  today F/u PCP in 3-4 weeks F/u PM&R 2 weeks See D/C summary See D/C instructions Care Tool:  Bathing    Body parts bathed by patient: Right arm, Chest, Abdomen, Right upper leg, Left upper leg, Face, Front perineal area, Buttocks, Left arm, Left lower leg, Right lower leg   Body parts bathed by helper: Right lower leg     Bathing assist Assist Level: Set up assist     Upper Body Dressing/Undressing Upper body dressing   What is the patient wearing?: Pull over shirt, Bra    Upper body assist Assist Level: Set up assist    Lower Body Dressing/Undressing Lower body dressing      What is the patient wearing?: Underwear/pull up, Pants     Lower body assist Assist for lower body dressing: Set up assist     Toileting Toileting    Toileting assist Assist for toileting: Supervision/Verbal cueing     Transfers Chair/bed transfer  Transfers assist  Chair/bed transfer activity did not occur: Safety/medical concerns  Chair/bed transfer assist level: Supervision/Verbal cueing     Locomotion Ambulation   Ambulation assist   Ambulation activity did not occur: Safety/medical concerns  Assist level: Supervision/Verbal cueing Assistive device: Walker-rolling Max distance: 160ft   Walk 10 feet activity   Assist  Walk 10 feet activity did not occur: Safety/medical concerns  Assist level: Supervision/Verbal cueing Assistive device: Walker-rolling   Walk 50 feet activity   Assist Walk 50 feet with 2 turns activity did not occur: Safety/medical concerns  Assist level:  Supervision/Verbal cueing Assistive device: Walker-rolling    Walk 150 feet activity   Assist Walk 150 feet activity did not occur: Safety/medical concerns  Assist level: Supervision/Verbal cueing Assistive device: Walker-rolling    Walk 10 feet on uneven surface  activity   Assist Walk 10 feet on uneven surfaces activity did not occur: Safety/medical concerns   Assist level: Contact  Guard/Touching assist Assistive device: Aeronautical engineer Will patient use wheelchair at discharge?: No Type of Wheelchair: Manual Wheelchair activity did not occur: Safety/medical concerns  Wheelchair assist level: Supervision/Verbal cueing Max wheelchair distance: 150    Wheelchair 50 feet with 2 turns activity    Assist    Wheelchair 50 feet with 2 turns activity did not occur: Safety/medical concerns   Assist Level: Supervision/Verbal cueing   Wheelchair 150 feet activity     Assist  Wheelchair 150 feet activity did not occur: Safety/medical concerns   Assist Level: Supervision/Verbal cueing   Blood pressure (!) 131/58, pulse 73, temperature 98.5 F (36.9 C), resp. rate 18, height 5\' 3"  (1.6 m), weight 76.2 kg, SpO2 96 %.  Medical Problem List and Plan: 1.  Right side weakness and slurred speech secondary to left pontine acute perforator infarction  Continue PT OT through home health  -Discharge 12/17   2.  Antithrombotics: -DVT/anticoagulation: Lovenox- will d/c on 12/17             -antiplatelet therapy: Aspirin and Plavix x3 weeks then aspirin alone 12/21 3. Pain Management: Tylenol as needed hypersenstive over the RIght lateral thigh and back of knee, had a fall with large lateral hip hematoma , , old blood products likely tracking down and causing some irritation.  -this has been discussed with pt and should improve with time  4. Mood: Provide emotional support             -antipsychotic agents: N/A 5. Neuropsych: This patient is capable of making decisions on her own behalf. 6. Skin/Wound Care: Routine skin checks 7. Fluids/Electrolytes/Nutrition: Routine in and outs with follow-up chemistries 8.  Diabetes mellitus.  Hemoglobin A1c 11.8.  non compliant with meds at home. Diabetic teaching CBG (last 3)  Recent Labs    08/28/19 1640 08/28/19 2123 08/29/19 0623  GLUCAP 124* 117* 149*   Well controled on Lantus and novalog,  will have 1 mo pt assistance then f/u at [facility] clinic, Medicare kicks in March 2021   9.  Hyperlipidemia.  Lipitor. 10. HTN: She was on no home medications, but is currently hypertensive. Neurology recommends permissive HTN for 1 week, in 2nd week of rehab will add hypertensive agent if BP still elevated.  Vitals:   08/28/19 1932 08/29/19 0537  BP: (!) 149/91 (!) 131/58  Pulse: 90 73  Resp: 20 18  Temp: 98.2 F (36.8 C) 98.5 F (36.9 C)  SpO2: 98% 96%  BP controlled 12/17 10. Education: Will require stroke prevention education and education regarding the importance of good control of her HTN and DM. She was noncompliant with DM medications at home. 11.  Insomnia schedule trazodone  12. Constipation: added senna-docusate.  -improved no supp needed    LOS: 17 days A FACE TO FACE EVALUATION WAS PERFORMED  Charlett Blake 08/29/2019, 9:33 AM

## 2019-09-02 ENCOUNTER — Telehealth: Payer: Self-pay | Admitting: *Deleted

## 2019-09-02 NOTE — Telephone Encounter (Signed)
Transitional Care call-I spoke with Mrs Jacqueline Leach and her daughter [name]    1. Are you/is patient experiencing any problems since coming home? Are there any questions regarding any aspect of care?NO PROBLEMS WITH CARE, HAS DEVELOPED A RASH OVER BODY AND THEY SUSPECT MAY BE DETERGENT USED BUT IS GOING TO URGENT CARE TO BE CHECKED ANYWAY. 2. Are there any questions regarding medications administration/dosing? Are meds being taken as prescribed? Patient should review meds with caller to confirm HAS ALL MEDICATIONS. TOOK LAST DOSE OF PLAVIX (TO BE STOPPED 09/02/19 3. Have there been any falls? NO 4. Has Home Health been to the house and/or have they contacted you? If not, have you tried to contact them? Can we help you contact them? THEY HAVE CONTACTED HER, THERAPIES HAVE NOT STARTED YET 5. Are bowels and bladder emptying properly? Are there any unexpected incontinence issues? If applicable, is patient following bowel/bladder programs? NO PROBLEMS WITH BLADDER, WAS HAVING DIARRHEA BUT HAS SLOWED SINCE STOPPED PLAVIX YESTERDAY 6. Any fevers, problems with breathing, unexpected pain? NO 7. Are there any skin problems or new areas of breakdown? SKIN -RASH SEE ABOVE 8. Has the patient/family member arranged specialty MD follow up (ie cardiology/neurology/renal/surgical/etc)?  Can we help arrange? APPT GIVEN TO FOLLOW UP IN DR Letta Pate OFFICE-WILL SEE EUNICE FIRST 9. Does the patient need any other services or support that we can help arrange? NO 10. Are caregivers following through as expected in assisting the patient? YES 11. Has the patient quit smoking, drinking alcohol, or using drugs as recommended? N/A  Appointment Thursday 09/12/19 @1 :00 ARRIVE BY 12:40 TO SEE Danella Sensing NP THEN BACK TO Letta Pate ADDRESS REVIEWED AND TOLD TO LOOK FOR PACKET FROM OFFICE IN MAIL [address]

## 2019-09-03 ENCOUNTER — Telehealth: Payer: Self-pay | Admitting: *Deleted

## 2019-09-03 MED ORDER — INSULIN LISPRO 100 UNIT/ML ~~LOC~~ SOLN: 5.0000 [IU] | Freq: Three times a day (TID) | SUBCUTANEOUS | 0 refills | Status: DC

## 2019-09-03 NOTE — Telephone Encounter (Signed)
Jacqueline Leach went to the [facility] yesterday about a rash she developed.  The MD there put her on taper dose of prednisone. He was concerned because she was not on short acting insulin-just the lantus.  She cannot go to the [facility] until 09/12/19 to establish primary care .  She was told to contact her rehab doctor about the insulin issue. She was on novolog tid in the hospital. Dr Letta Pate last progress note says "Well controled on Lantus and novalog, will have 1 mo pt assistance then f/u at [facility] clinic, Medicare kicks in March 2021."  Jacqueline Leach did not write for novolog.  Her blood sugars are running mostly in 300's -last reported 321.  Please advise.

## 2019-09-03 NOTE — Telephone Encounter (Addendum)
Jacqueline Leach's daughter notified of the short acting insulin order from Dr Letta Pate to be taken only while taking the prednisone. She will see her new PCP next week and they can assume management of blood sugars.

## 2019-09-12 ENCOUNTER — Encounter: Payer: Self-pay | Attending: Registered Nurse | Admitting: Registered Nurse

## 2019-10-03 ENCOUNTER — Inpatient Hospital Stay: Payer: Self-pay | Admitting: Adult Health

## 2019-10-03 NOTE — Progress Notes (Deleted)
[facility] [address] [place]. [place] [address] [phone]       HOSPITAL FOLLOW UP NOTE  [name] Date of Birth:  1956-01-21 Medical Record Number:  [id number]   Reason for Referral:  hospital stroke follow up    CHIEF COMPLAINT:  No chief complaint on file.   HPI: Jacqueline Leach being seen today for in office hospital follow-up regarding left pontine infarct status post IV TPA at outside hospital received a complete dosing due to inaccurate infusion schedule resulting in hematemesis and leg hematoma with infarct likely secondary to small vessel disease on 08/08/2019.  History obtained from *** and chart review. Reviewed all radiology images and labs personally.  Ms.Jacqueline Grimsleyis a 64 y.o.femalewith history of arthritis, diabetes mellitus noncompliant with her medications, possibly hypertensionpresented [facility] the emergency [facility] with sudden onset right sided weakness and slurred speech resulting in her falling at 6:30 [facility] 08/08/19. CTA showed bilateral PCA stenosis but negative for large vessel occlusion.  NIHSS 4 for mild right hemiparesis and dysarthria.  TPA at [facility] per tele-neurologyand transfer to [facility]. tPA stopped at [facility] due to hematemesis as well as R leg hematoma.  Also reported upon arrival to [facility], right upper extremity weakness worsened as well as hematoma in the right leg larger in size.  Evaluated by Dr. Leonie Man and Dr. Erlinda Hong as well as stroke team with stroke work-up revealing left pontine infarct as evidenced on MRI s/p IV TPA at outside hospital received incomplete dosing due to inaccurate confusion schedule and hematemesis and enlarging leg hematoma with infarct likely secondary to small vessel disease.  In addition to acute stroke, MRI also showed remote lacunar infarct in the right cerebral white matter.  Carotid Doppler unremarkable.  2D echo showed an EF of 60 to 65%.  Sars coronavirus 2 negative.   Not previously on antithrombotic and recommended DAPT for 3 weeks and aspirin alone.  Hematemesis and hematoma right thigh post TPA stable hematoma and resolution of hematemesis.  History of HTN and recommended BP goal normotensive range.  LDL 103 and initiated atorvastatin 80 mg daily.  Uncontrolled DM with A1c 11.8 and recommended close PCP follow-up outpatient with Metformin 500 mg twice daily and Lantus 12 units daily.  Other stroke risk factors include advanced age, obesity and medical noncompliance.  Other active problems include mild anemia.  Residual deficits of mild right facial droop, and right hemiparesis therefore was discharged to CIR for ongoing therapy needs.    ROS:   14 system review of systems performed and negative with exception of ***  PMH: No past medical history on file.  PSH: No past surgical history on file.  Social History:  Social History   Socioeconomic History  . Marital status: Widowed    Spouse name: Not on file  . Number of children: Not on file  . Years of education: Not on file  . Highest education level: Not on file  Occupational History  . Not on file  Tobacco Use  . Smoking status: Never Smoker  . Smokeless tobacco: Never Used  Substance and Sexual Activity  . Alcohol use: Not Currently  . Drug use: Never  . Sexual activity: Not Currently  Other Topics Concern  . Not on file  Social History Narrative  . Not on file   Social Determinants of Health   Financial Resource Strain:   . Difficulty of Paying Living Expenses: Not on file  Food Insecurity:   . Worried About  Running Out of Food in the Last Year: Not on file  . Ran Out of Food in the Last Year: Not on file  Transportation Needs:   . Lack of Transportation (Medical): Not on file  . Lack of Transportation (Non-Medical): Not on file  Physical Activity:   . Days of Exercise per Week: Not on file  . Minutes of Exercise per Session: Not on file  Stress:   . Feeling of Stress : Not on  file  Social Connections:   . Frequency of Communication with Friends and Family: Not on file  . Frequency of Social Gatherings with Friends and Family: Not on file  . Attends Religious Services: Not on file  . Active Member of Clubs or Organizations: Not on file  . Attends Archivist Meetings: Not on file  . Marital Status: Not on file  Intimate Partner Violence:   . Fear of Current or Ex-Partner: Not on file  . Emotionally Abused: Not on file  . Physically Abused: Not on file  . Sexually Abused: Not on file    Family History: No family history on file.  Medications:   Current Outpatient Medications on File Prior to Visit  Medication Sig Dispense Refill  . acetaminophen (TYLENOL) 325 MG tablet Take 2 tablets (650 mg total) by mouth every 4 (four) hours as needed for mild pain (or temp > 37.5 C (99.5 F)).    Marland Kitchen aspirin 81 MG chewable tablet Chew 1 tablet (81 mg total) by mouth daily.    Marland Kitchen atorvastatin (LIPITOR) 80 MG tablet Take 1 tablet (80 mg total) by mouth daily at 6 PM. 30 tablet 0  . blood glucose meter kit and supplies Dispense based on patient and insurance preference. Use up to four times daily as directed. (FOR ICD-10 E10.9, E11.9). 1 each 0  . clopidogrel (PLAVIX) 75 MG tablet Take 1 tablet (75 mg total) by mouth daily. 5 tablet 0  . Insulin Glargine (LANTUS) 100 UNIT/ML Solostar Pen Inject 30 Units into the skin daily. 15 mL 11  . insulin lispro (HUMALOG) 100 UNIT/ML injection Inject 0.05 mLs (5 Units total) into the skin 3 (three) times daily before meals. Use only while patient is taking prednisone 10 mL 0  . pantoprazole (PROTONIX) 40 MG tablet Take 1 tablet (40 mg total) by mouth daily. 30 tablet 0  . senna-docusate (SENOKOT-S) 8.6-50 MG tablet Take 1 tablet by mouth at bedtime.    . traZODone (DESYREL) 50 MG tablet Take 1 tablet (50 mg total) by mouth at bedtime. 20 tablet 0   No current facility-administered medications on file prior to visit.     Allergies:   Allergies  Allergen Reactions  . Penicillins Rash    Did it involve swelling of the face/tongue/throat, SOB, or low BP? Yes Did it involve sudden or severe rash/hives, skin peeling, or any reaction on the inside of your mouth or nose? Yes Did you need to seek medical attention at a hospital or doctor's office? Yes When did it last happen?approx 64yo If all above answers are "NO", may proceed with cephalosporin use.   . Sulfa Antibiotics Rash     Physical Exam  There were no vitals filed for this visit. There is no height or weight on file to calculate BMI. No exam data present  No flowsheet data found.   General: well developed, well nourished, seated, in no evident distress Head: head normocephalic and atraumatic.   Neck: supple with no carotid or supraclavicular  bruits Cardiovascular: regular rate and rhythm, no murmurs Musculoskeletal: no deformity Skin:  no rash/petichiae Vascular:  Normal pulses all extremities   Neurologic Exam Mental Status: Awake and fully alert. Oriented to place and time. Recent and remote memory intact. Attention span, concentration and fund of knowledge appropriate. Mood and affect appropriate.  Cranial Nerves: Fundoscopic exam reveals sharp disc margins. Pupils equal, briskly reactive to light. Extraocular movements full without nystagmus. Visual fields full to confrontation. Hearing intact. Facial sensation intact. Face, tongue, palate moves normally and symmetrically.  Motor: Normal bulk and tone. Normal strength in all tested extremity muscles. Sensory.: intact to touch , pinprick , position and vibratory sensation.  Coordination: Rapid alternating movements normal in all extremities. Finger-to-nose and heel-to-shin performed accurately bilaterally. Gait and Station: Arises from chair without difficulty. Stance is normal. Gait demonstrates normal stride length and balance Reflexes: 1+ and symmetric. Toes downgoing.      NIHSS  *** Modified Rankin  *** CHA2DS2-VASc *** HAS-BLED ***   Diagnostic Data (Labs, Imaging, Testing)  CT HEAD WO CONTRAST ***  CT ANGIO HEAD W OR WO CONTRAST CT ANGIO NECK W OR WO CONTRAST ***  MR BRAIN WO CONTRAST ***  MR MRA HEAD  MR MRA NECK ***  ECHOCARDIOGRAM ***    ASSESSMENT: Jacqueline Leach is a 64 y.o. year old female presented with right hemiparesis and slurred speech on 09/07/2019 receiving IV tPA but did not receive complete dosing due to inaccurate infusion schedule resulting in hematemesis and right leg hematoma secondary to small vessel disease source. Vascular risk factors include DM with noncompliance of medications, HTN, HLD, prior stroke on imaging and obesity.     PLAN:  1. *** : Continue {anticoagulants:31417}  and ***  for secondary stroke prevention. Maintain strict control of hypertension with blood pressure goal below 130/90, diabetes with hemoglobin A1c goal below 6.5% and cholesterol with LDL cholesterol (bad cholesterol) goal below 70 mg/dL.  I also advised the patient to eat a healthy diet with plenty of whole grains, cereals, fruits and vegetables, exercise regularly with at least 30 minutes of continuous activity daily and maintain ideal body weight. 2. HTN: Advised to continue current treatment regimen.  Today's BP ***.  Advised to continue to monitor at home along with continued follow-up with PCP for management 3. HLD: Advised to continue current treatment regimen along with continued follow-up with PCP for future prescribing and monitoring of lipid panel 4. DMII: Advised to continue to monitor glucose levels at home along with continued follow-up with PCP for management and monitoring    Follow up in *** or call earlier if needed   Greater than 50% of time during this 45 minute visit was spent on counseling, explanation of diagnosis of ***, reviewing risk factor management of ***, planning of further management along with  potential future management, and discussion with patient and family answering all questions.    Frann Rider, AGNP-BC  [facility] [address] [place], [place] [address]  Phone [phone] Fax [phone] Note: This document was prepared with digital dictation and possible smart phrase technology. Any transcriptional errors that result from this process are unintentional.

## 2019-10-04 ENCOUNTER — Encounter: Payer: Self-pay | Admitting: Adult Health

## 2019-10-23 ENCOUNTER — Other Ambulatory Visit: Payer: Self-pay

## 2019-10-23 NOTE — Patient Outreach (Signed)
First telephone outreach attempt to obtain mRs. Patient does not have a DPR on file. Left message with daughter [name] for returned call.   [facility] Management Assistant [phone]

## 2019-11-07 ENCOUNTER — Other Ambulatory Visit: Payer: Self-pay

## 2019-11-07 NOTE — Patient Outreach (Signed)
Second telephone outreach attempt to obtain mRs. Unable to leave message for returned call due to no voicemail options.   [facility] Management Assistant

## 2019-11-12 ENCOUNTER — Other Ambulatory Visit: Payer: Self-pay

## 2019-11-12 NOTE — Patient Outreach (Signed)
3 outreach attempts were completed to obtain mRs. mRs could not be obtained because patient never returned my calls. mRs=7    [facility] Management Assistant [phone]

## 2020-05-28 ENCOUNTER — Other Ambulatory Visit: Payer: Self-pay | Admitting: Family Medicine

## 2020-05-28 DIAGNOSIS — R921 Mammographic calcification found on diagnostic imaging of breast: Secondary | ICD-10-CM

## 2020-06-09 ENCOUNTER — Ambulatory Visit
Admission: RE | Admit: 2020-06-09 | Discharge: 2020-06-09 | Disposition: A | Discharge status: Home / Self Care | Payer: Medicare Other | Source: Ambulatory Visit | Attending: Family Medicine | Admitting: Family Medicine

## 2020-06-09 ENCOUNTER — Other Ambulatory Visit: Payer: Self-pay | Admitting: Family Medicine

## 2020-06-09 ENCOUNTER — Other Ambulatory Visit: Payer: Self-pay

## 2020-06-09 DIAGNOSIS — R921 Mammographic calcification found on diagnostic imaging of breast: Secondary | ICD-10-CM

## 2020-06-09 IMAGING — MG MM BREAST LOCALIZATION CLIP
4 series · 4 of 12 positions shown · non-contrast
Comparison: Previous exam(s).

CLINICAL DATA: Status post stereotactic guided core biopsy of 2
sites in the LEFT breast.

EXAM:
3D DIAGNOSTIC LEFT MAMMOGRAM POST STEREOTACTIC BIOPSY

[L CC synth-2D]
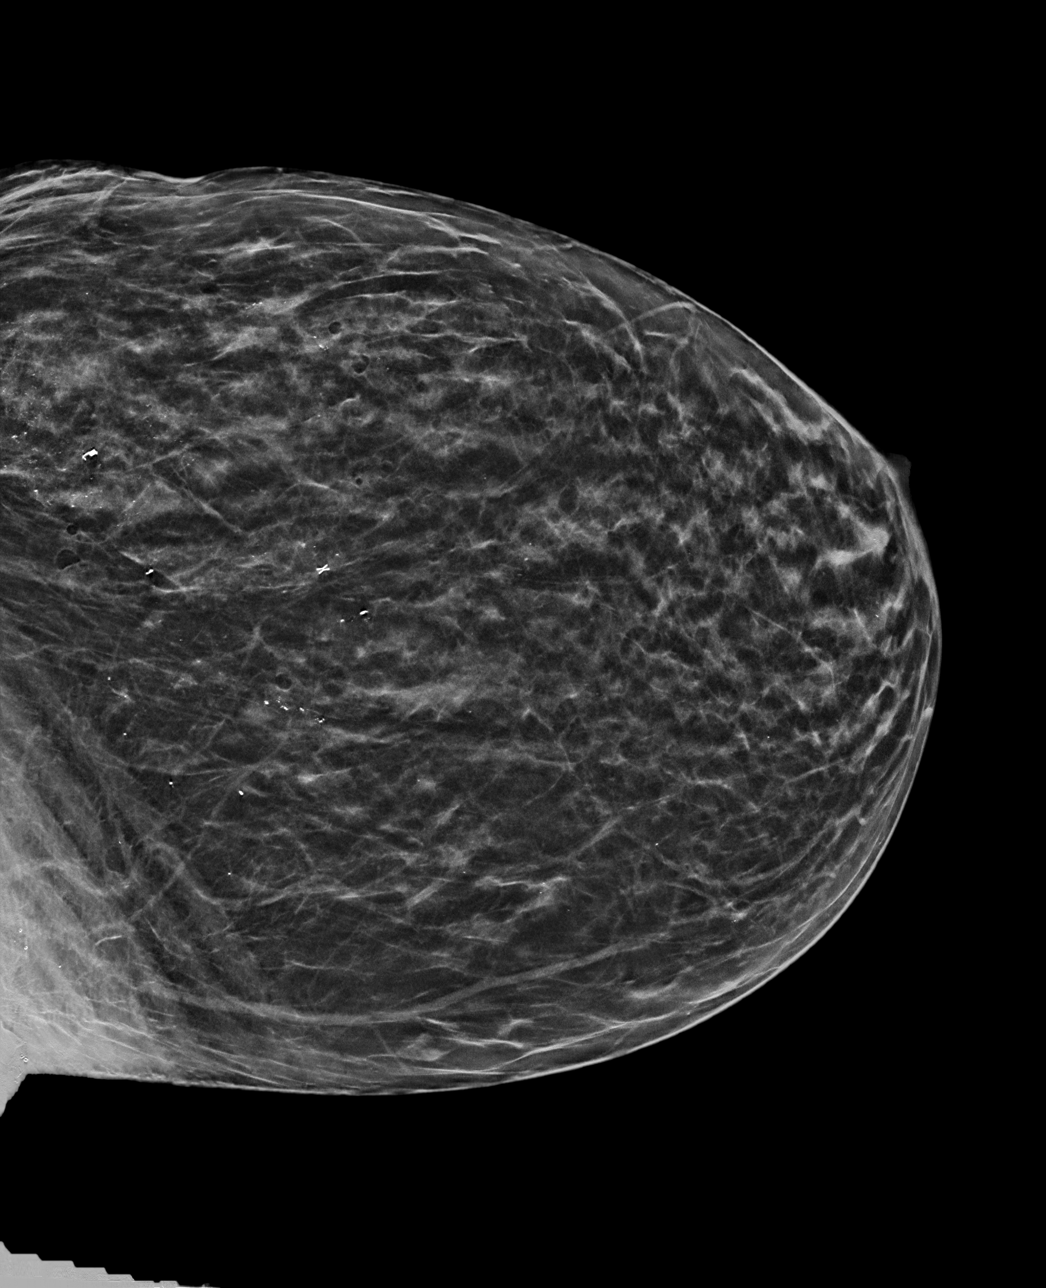

[L LM synth-2D]
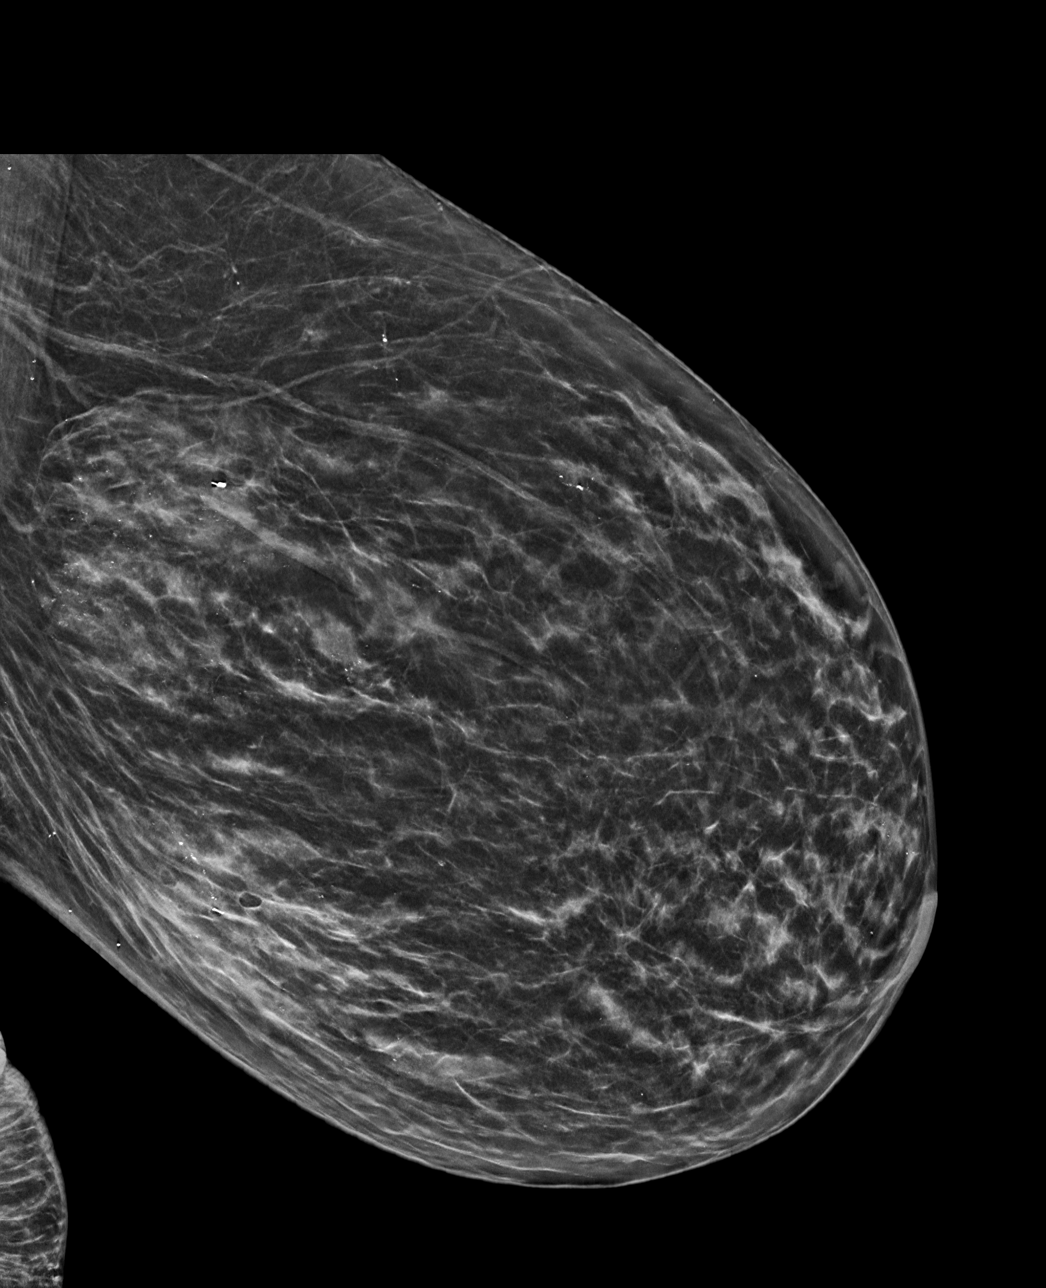

[L CC tomo · tomo slice 28/55.0]
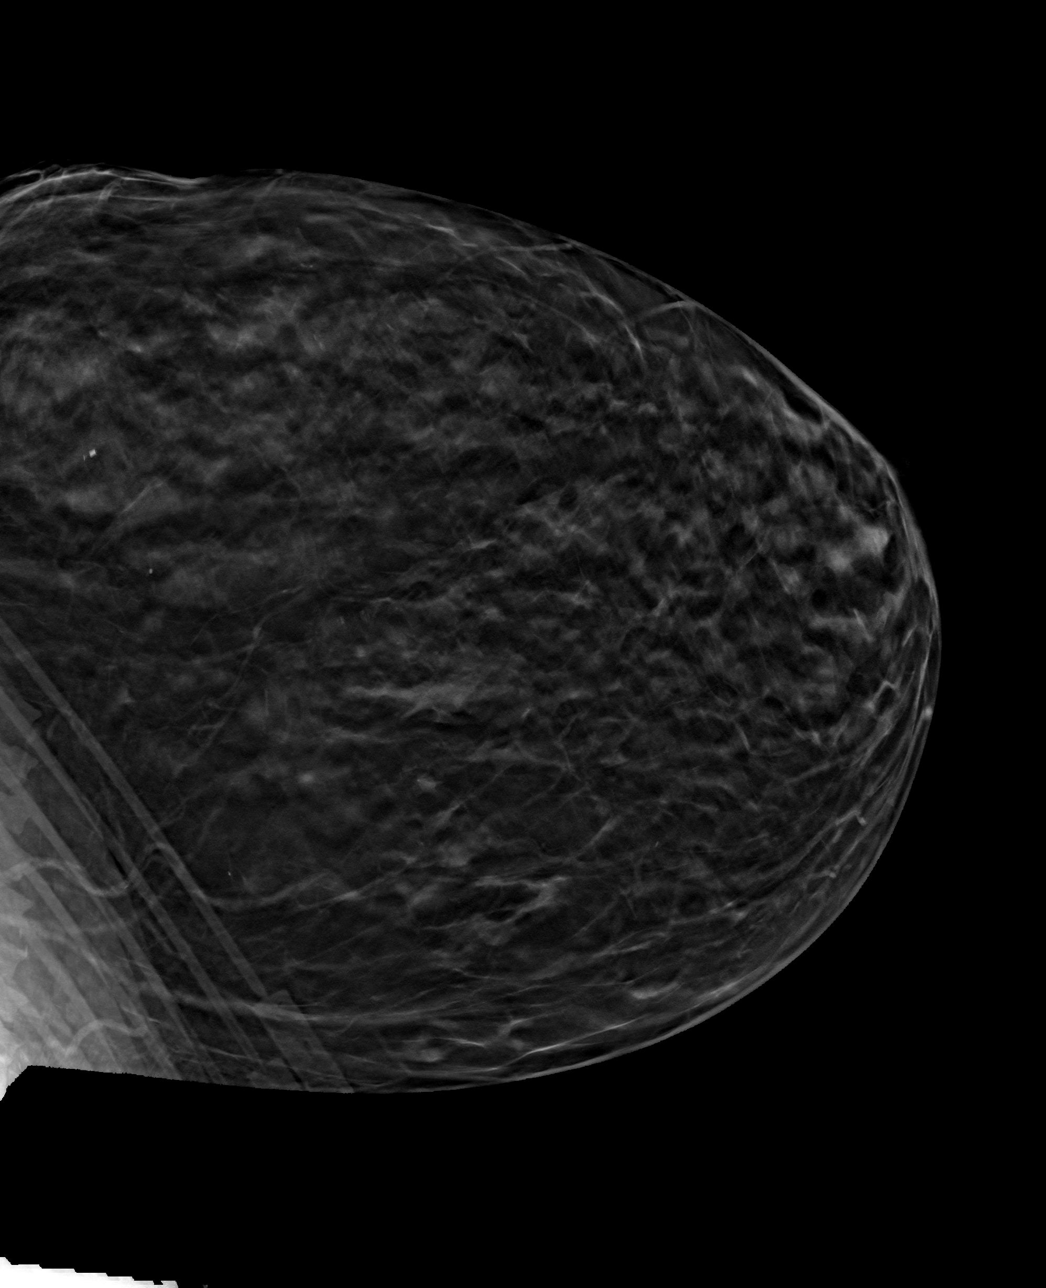

[L LM tomo · tomo slice 29/57.0]
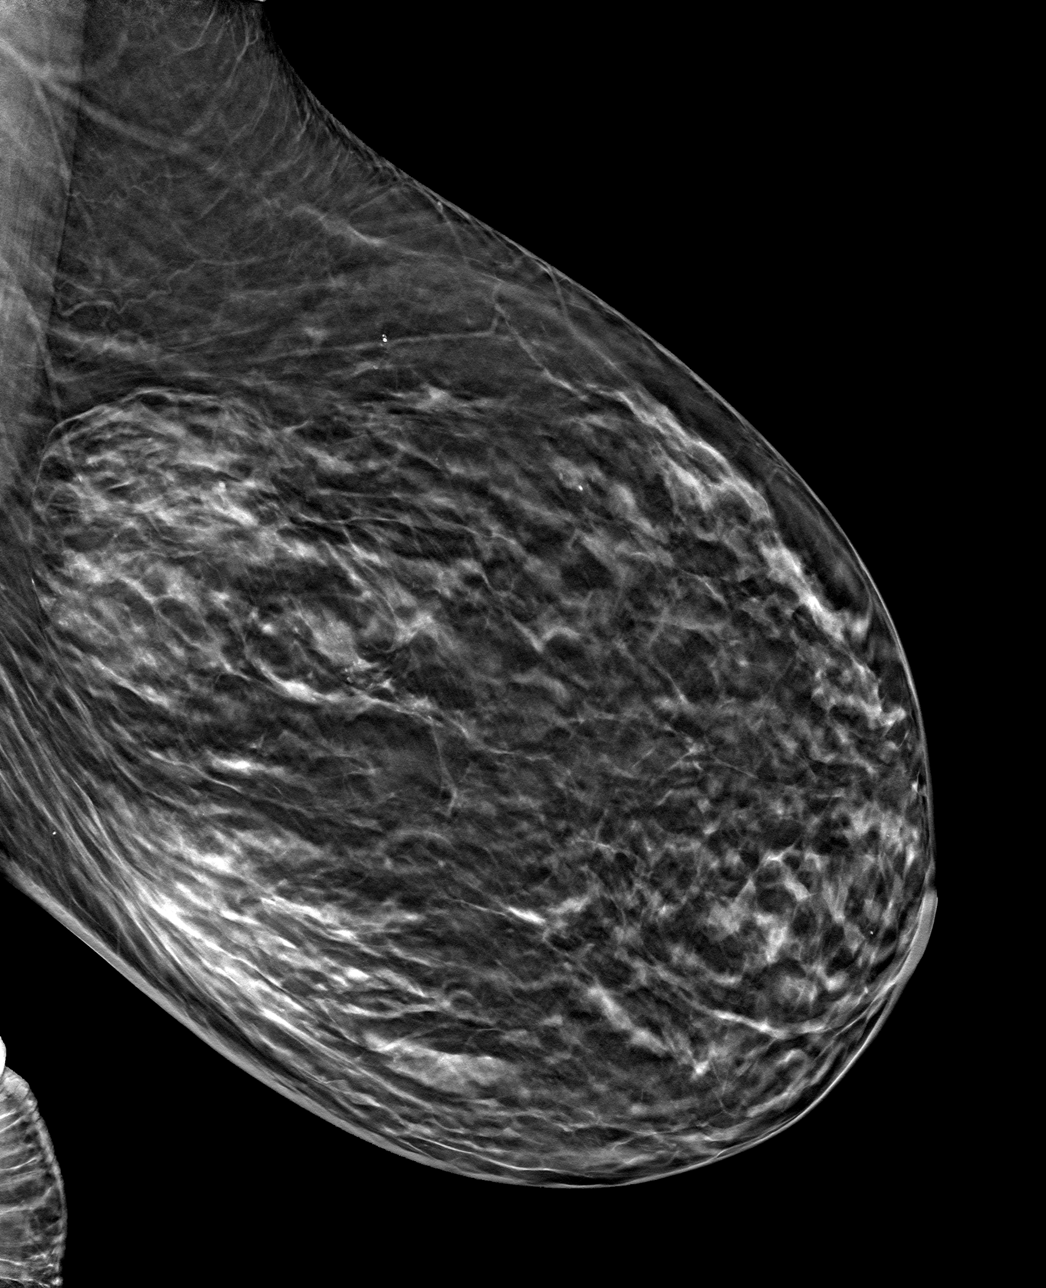

[4 of 12 positions shown; findings below may reference images not displayed]

FINDINGS: 3D Mammographic images were obtained following stereotactic guided
biopsy of calcifications in the UPPER-OUTER QUADRANT of the LEFT
breast and placement of a coil shaped clip. The biopsy marking clip
is in expected location in the UPPER-OUTER QUADRANT. There are
numerous residual calcifications at the biopsy site.

Following biopsy of calcifications in the LOWER central portion of
the LEFT breast, an shaped clip was placed in identified in expected
location, adjacent to residual calcifications.
IMPRESSION: Tissue marker clips are in the expected locations after biopsy.

Final Assessment: Post Procedure Mammograms for Marker Placement

## 2020-06-09 IMAGING — MG MM BREAST LOCALIZATION CLIP
4 series · 4 of 12 positions shown · non-contrast
Comparison: Previous exam(s).

CLINICAL DATA: Status post stereotactic guided core biopsy of 2
sites in the RIGHT breast.

EXAM:
DIAGNOSTIC RIGHT MAMMOGRAM POST STEREOTACTIC BIOPSY x2

[R CC synth-2D]
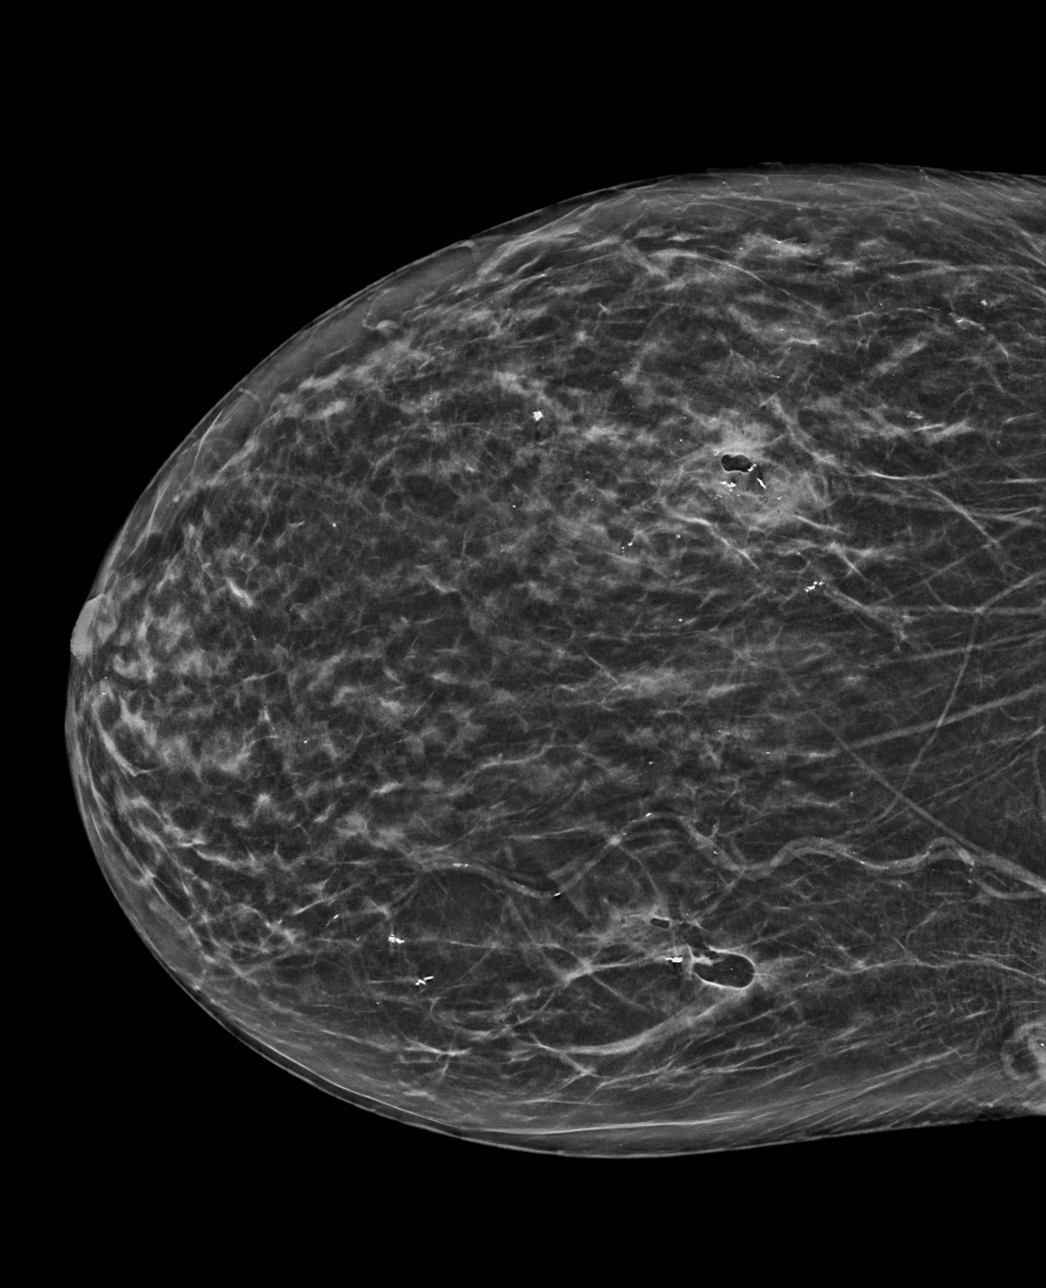

[R ML synth-2D]
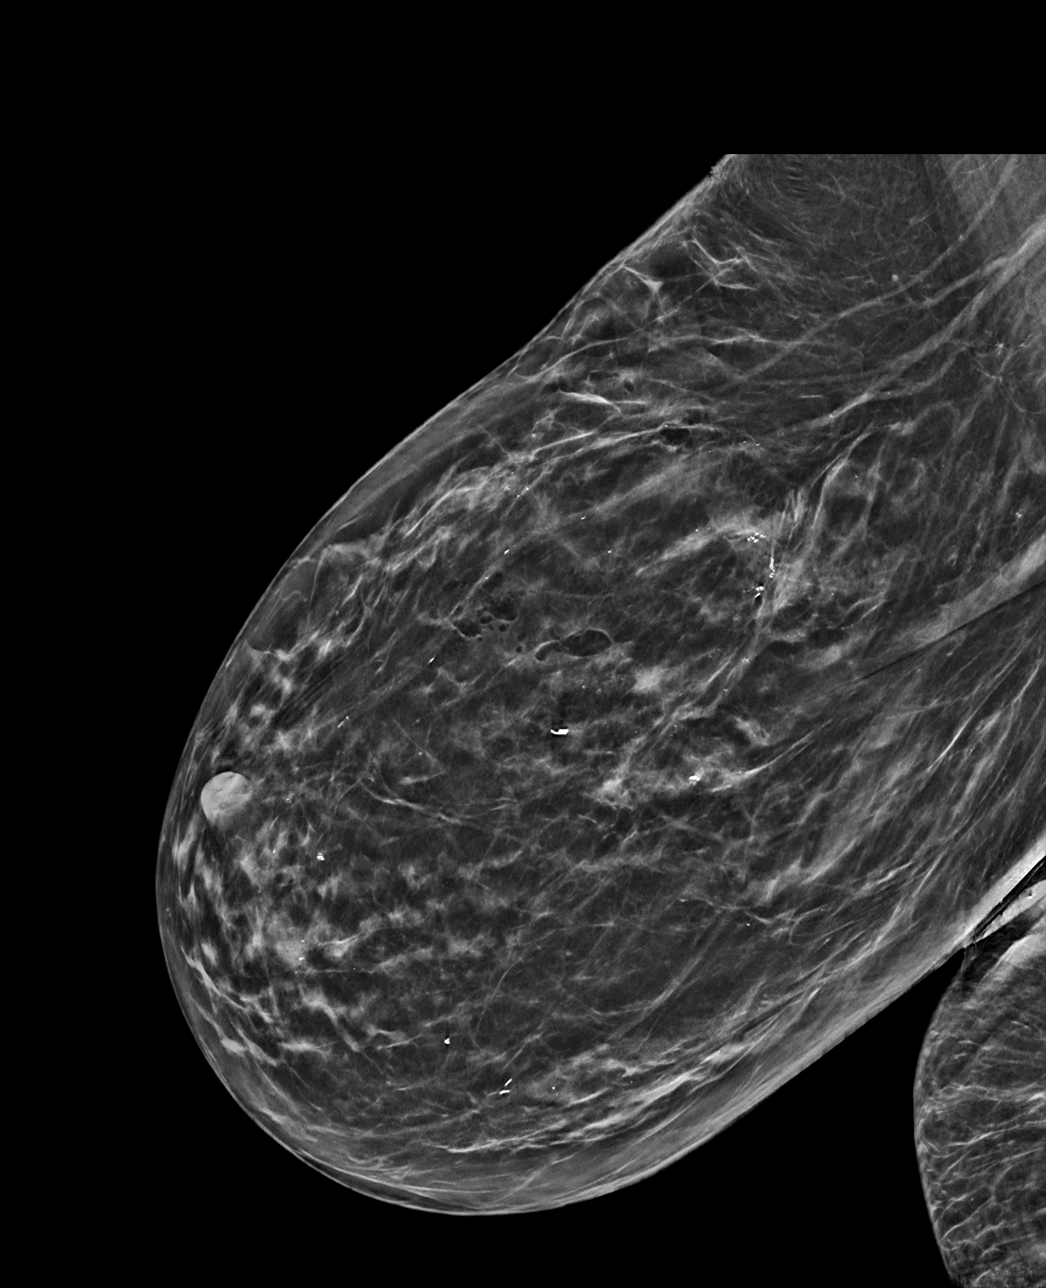

[R ML tomo · tomo slice 34/67.0]
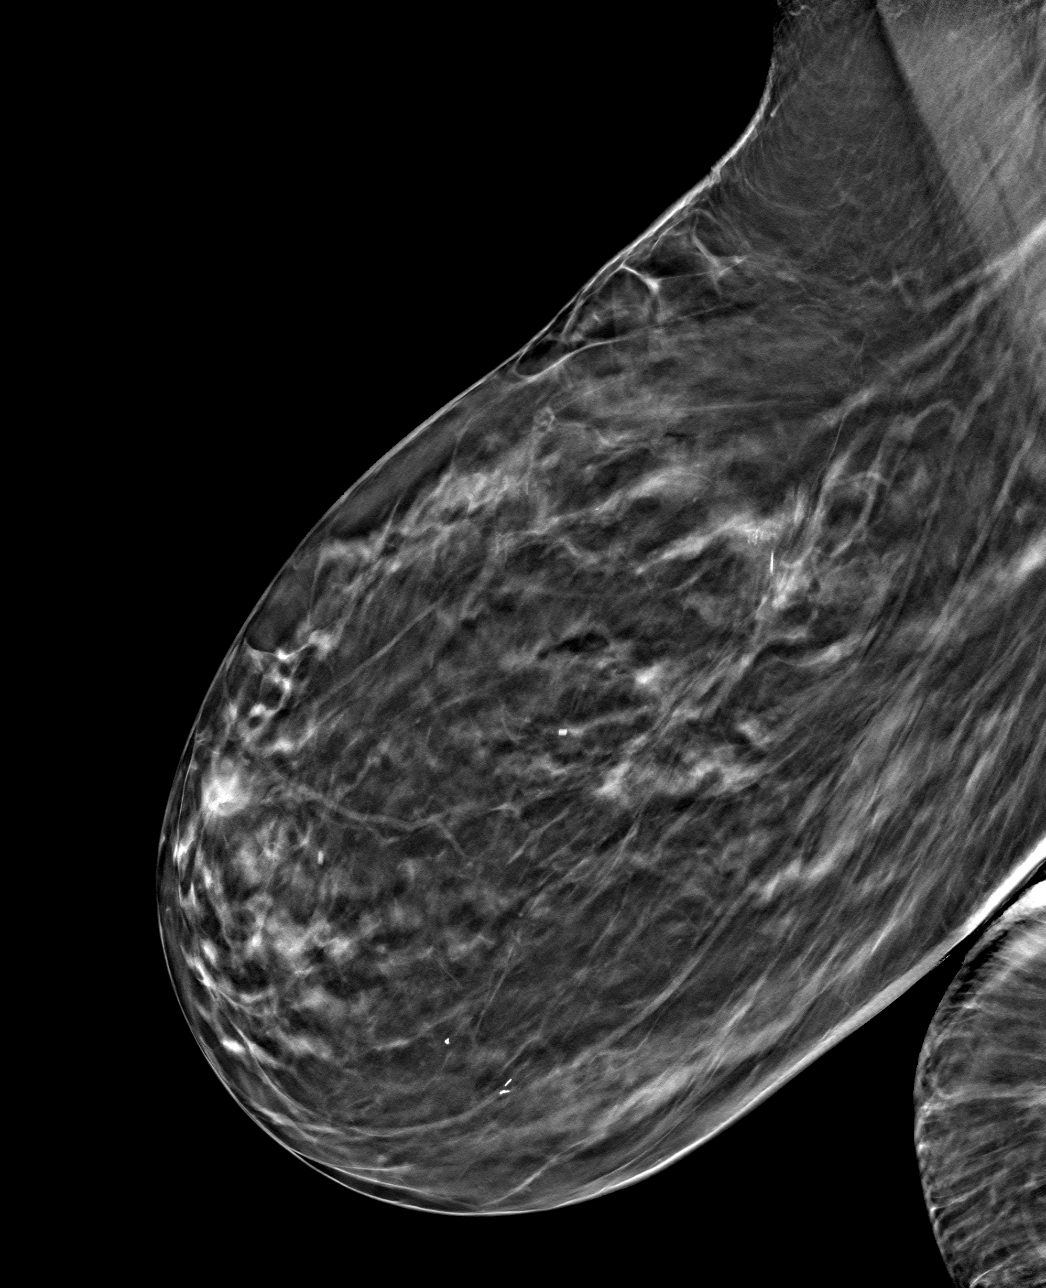

[R CC tomo · tomo slice 30/59.0]
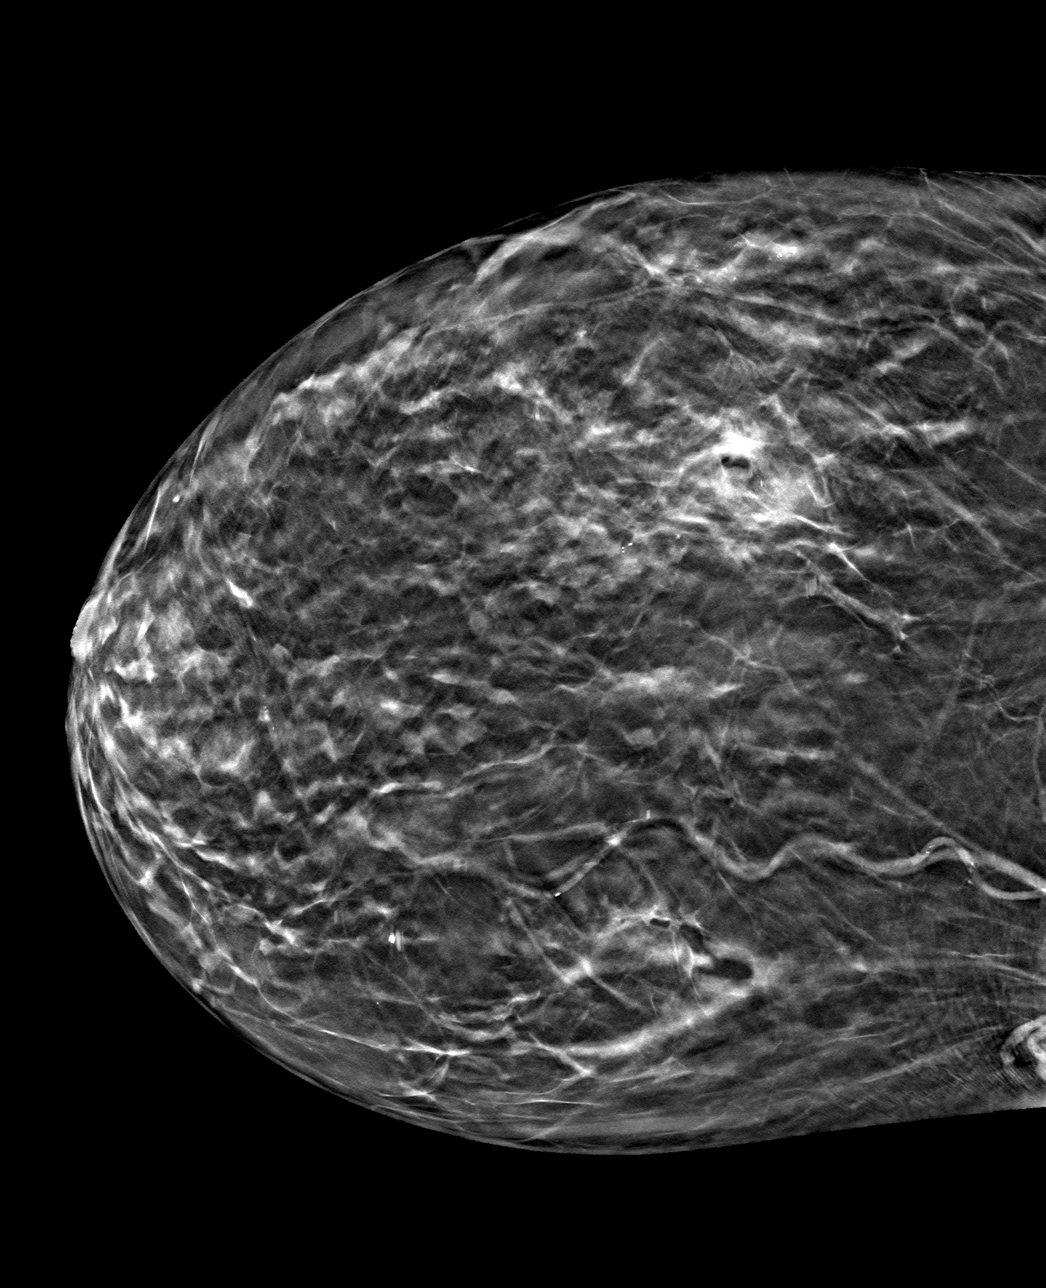

[4 of 12 positions shown; findings below may reference images not displayed]

FINDINGS: Mammographic images were obtained following stereotactic guided
biopsy of calcifications in the MEDIAL aspect of the RIGHT breast
and placement of a coil shaped clip. The biopsy marking clip is in
expected position at the site of biopsy adjacent to residual
calcifications.

Following biopsy of calcifications in the UPPER-OUTER QUADRANT of
the RIGHT breast, a X shaped clip was placed and is identified in
expected location adjacent to residual calcifications.
IMPRESSION: Tissue marker clips are in the expected locations after biopsy.

Final Assessment: Post Procedure Mammograms for Marker Placement

## 2020-06-09 IMAGING — MG MM BREAST BX W/ LOC DEV 1ST LESION IMAGE BX SPEC STEREO GUIDE*R*
6 of 17 series · 6 of 40 positions shown · non-contrast
Comparison: Previous exams.
COMPARISON: Previous exams.

Addendum:
CLINICAL DATA: Patient presents for stereotactic guided core biopsy
of 2 sites calcifications in each breast.

EXAM:
LEFT BREAST STEREOTACTIC CORE NEEDLE BIOPSY x2
RIGHT BREAST STEREOTACTIC CORE NEEDLE BIOPSY x2

[R (1 of 4)]
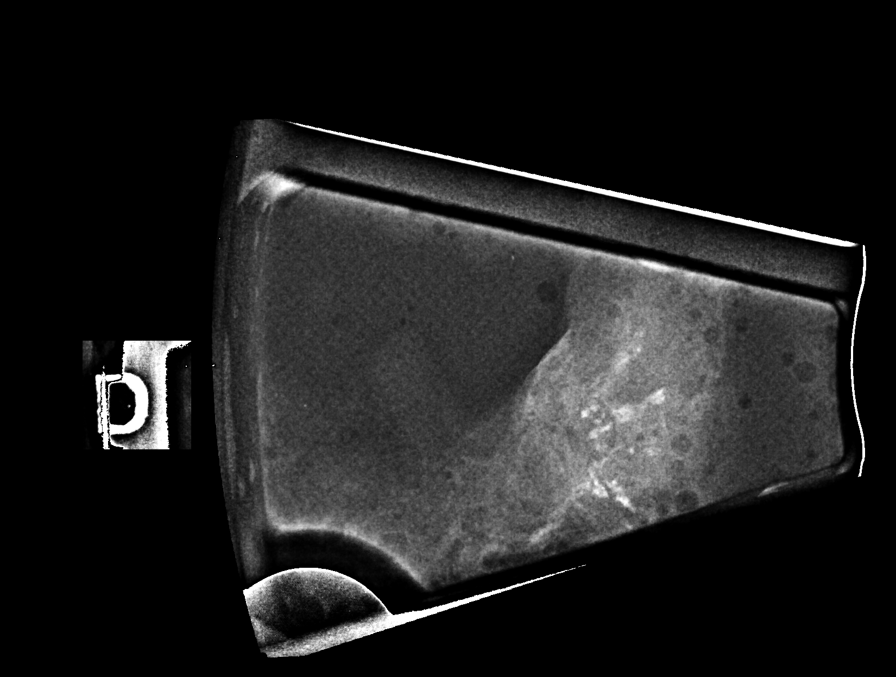

[R (2 of 4)]
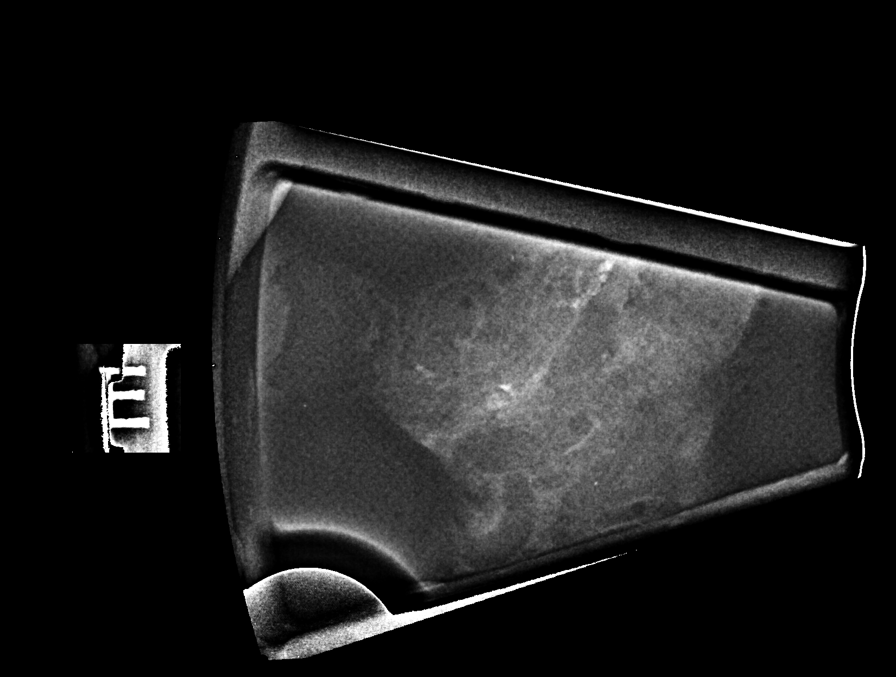

[R (3 of 4)]
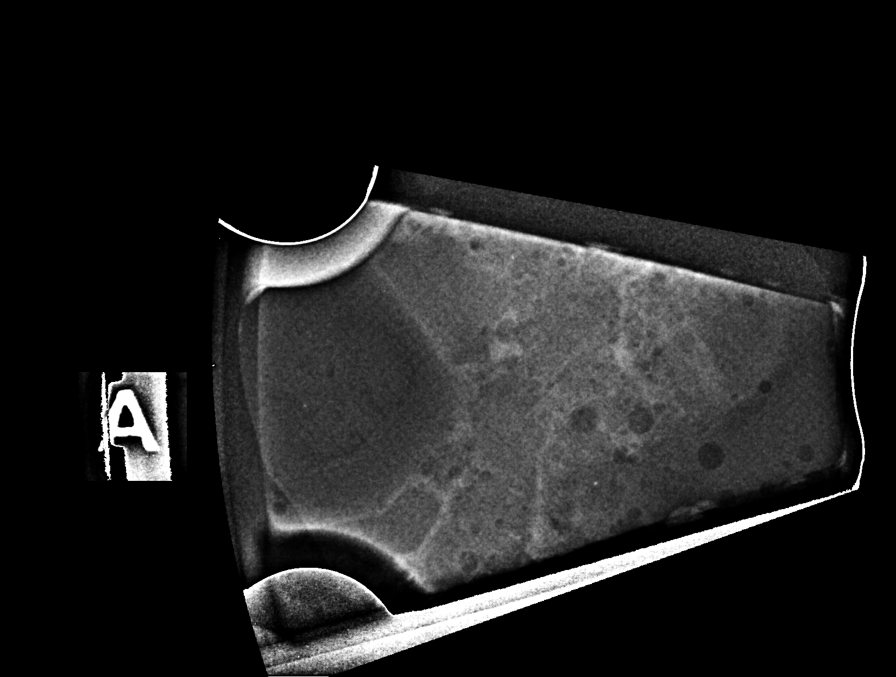

[R (4 of 4)]
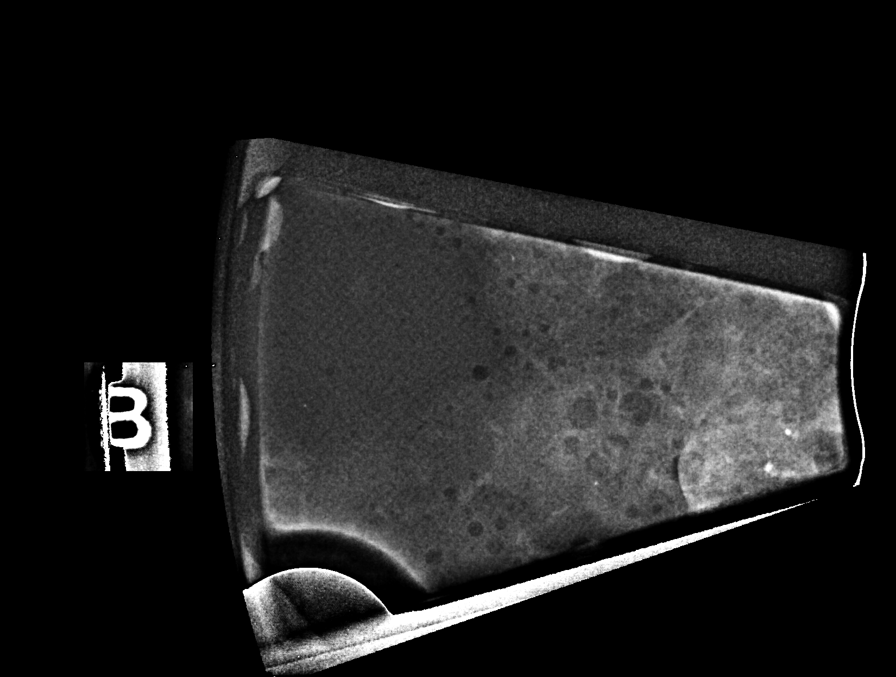

[R CC (1 of 2)]
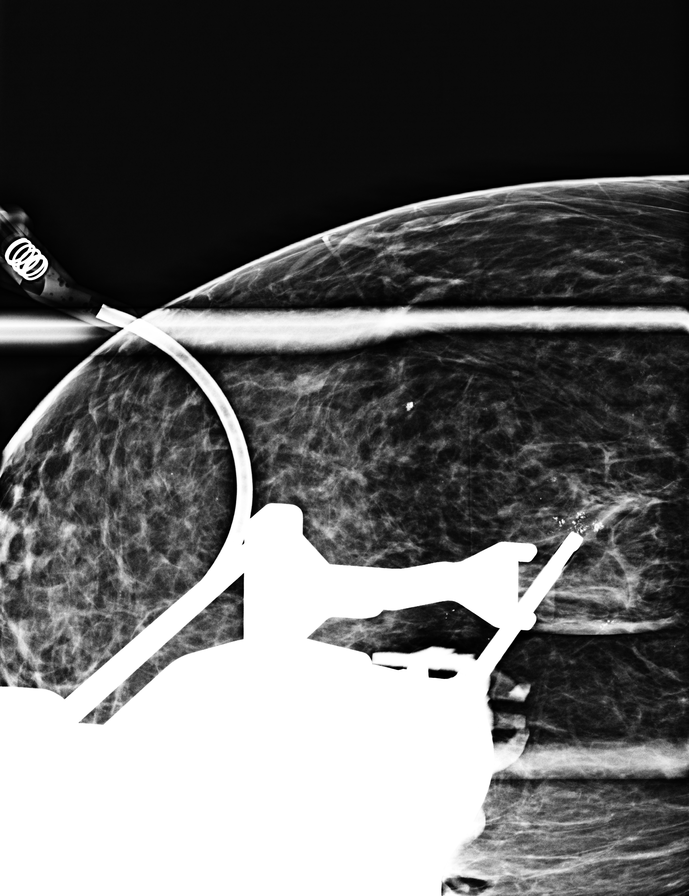

[R CC (2 of 2)]
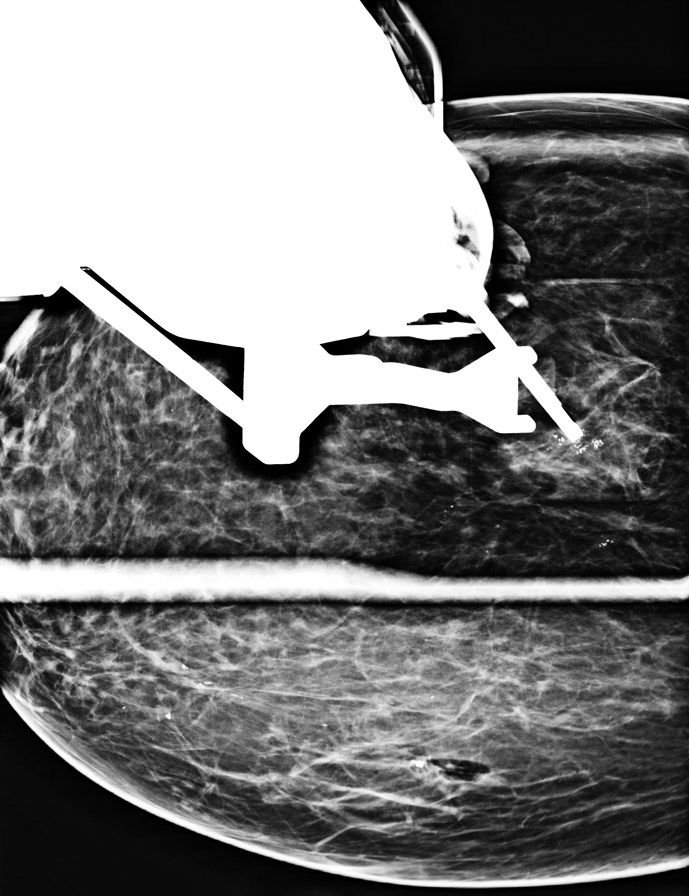

[6 of 40 positions shown; findings below may reference images not displayed]



RIGHT BREAST

Site 1: Lesion quadrant: UPPER INNER QUADRANT RIGHT breast, coil
shaped clip

Using sterile technique and 1% lidocaine and 1% lidocaine with
epinephrine as local anesthetic, under stereotactic guidance, a 9
gauge vacuum assisted device was used to perform core needle biopsy
of calcifications in the UPPER INNER QUADRANT the RIGHT breast using
a craniocaudal approach. Specimen radiograph was performed showing
calcifications in numerous tissue samples. Specimens with
calcifications are identified for pathology.

At the conclusion of the procedure, coil shaped tissue marker clip
was deployed into the biopsy cavity.

Site 2:   Lesion quadrant: UPPER OUTER QUADRANT RIGHT breast

Using sterile technique and 1% lidocaine and 1% lidocaine with
epinephrine as local anesthetic, under stereotactic guidance, a 9
gauge vacuum assisted device was used to perform core needle biopsy
of calcifications in the UPPER-OUTER QUADRANT of the RIGHT breast
using a craniocaudal approach. Specimen radiograph was performed
showing calcifications in numerous tissue samples. Specimens with
calcifications are identified for pathology.

At the conclusion of the procedure, X shaped tissue marker clip was
deployed into the biopsy cavity.

Follow-up 2-view RIGHT breast mammogram was performed and dictated
separately.

LEFT BREAST

Site 3: Lesion quadrant: UPPER OUTER QUADRANT LEFT breast, coil
shaped clip

Using sterile technique and 1% lidocaine and 1% lidocaine with
epinephrine as local anesthetic, under stereotactic guidance, a 9
gauge vacuum assisted device was used to perform core needle biopsy
of calcifications in the UPPER-OUTER QUADRANT of the LEFT breast
using a LATERAL to MEDIAL approach. Specimen radiograph was
performed showing calcifications in numerous tissue samples.
Specimens with calcifications are identified for pathology.

At the conclusion of the procedure, coil shaped tissue marker clip
was deployed into the biopsy cavity.

Site 4: Lesion quadrant: LOWER central portion of the LEFT breast,
best shaped clip

Using sterile technique and 1% lidocaine and 1% lidocaine with
epinephrine as local anesthetic, under stereotactic guidance, a 9
gauge vacuum assisted device was used to perform core needle biopsy
of calcifications in the LOWER central portion of the LEFT breast
using a LATERAL to MEDIAL approach. Specimen radiograph was
performed showing calcifications in numerous tissue samples.
Specimens with calcifications are identified for pathology.

At the conclusion of the procedure, X shaped tissue marker clip was
deployed into the biopsy cavity.

Followup two-view LEFT breast mammogram was performed and is
dictated separately.
IMPRESSION: Stereotactic-guided biopsy of 2 sites of calcifications in the RIGHT
breast in 2 sites of calcifications in the LEFT. No apparent
complications.

ADDENDUM:
Pathology revealed FIBROCYSTIC CHANGES WITH SCLEROSING ADENOSIS AND
CALCIFICATIONS of the RIGHT breast, upper inner quadrant, coil clip.
This was found to be concordant by Dr. [facility].

Pathology revealed FIBROCYSTIC CHANGES WITH SCLEROSING ADENOSIS AND
CALCIFICATIONS, FIBROADENOMATOID CHANGE WITH CALCIFICATIONS of the
RIGHT breast, upper outer quadrant, X clip. This was found to be
concordant by Dr. [facility].

Pathology revealed FIBROCYSTIC CHANGES WITH SCLEROSING ADENOSIS AND
CALCIFICATIONS of the LEFT breast, upper outer, coil clip. This was
found to be concordant by Dr. [facility].

Pathology revealed FIBROCYSTIC CHANGES WITH SCLEROSING ADENOSIS AND
CALCIFICATIONS, FIBROADENOMATOID CHANGE WITH CALCIFICATIONS of the
LEFT breast, lower central, X clip. This was found to be concordant
by Dr. [facility].

Pathology results were discussed with the patient by telephone. The
patient reported doing well after the biopsies with tenderness at
the sites. Post biopsy instructions and care were reviewed and
questions were answered. The patient was encouraged to call The

The patient was instructed to return for bilateral diagnostic
mammography in 6 months at [facility] and informed a reminder
notice would be sent regarding this appointment.

Pathology results reported by [facility] RN on [DATE].



RIGHT BREAST

Site 1: Lesion quadrant: UPPER INNER QUADRANT RIGHT breast, coil
shaped clip

Using sterile technique and 1% lidocaine and 1% lidocaine with
epinephrine as local anesthetic, under stereotactic guidance, a 9
gauge vacuum assisted device was used to perform core needle biopsy
of calcifications in the UPPER INNER QUADRANT the RIGHT breast using
a craniocaudal approach. Specimen radiograph was performed showing
calcifications in numerous tissue samples. Specimens with
calcifications are identified for pathology.

At the conclusion of the procedure, coil shaped tissue marker clip
was deployed into the biopsy cavity.

Site 2:   Lesion quadrant: UPPER OUTER QUADRANT RIGHT breast

Using sterile technique and 1% lidocaine and 1% lidocaine with
epinephrine as local anesthetic, under stereotactic guidance, a 9
gauge vacuum assisted device was used to perform core needle biopsy
of calcifications in the UPPER-OUTER QUADRANT of the RIGHT breast
using a craniocaudal approach. Specimen radiograph was performed
showing calcifications in numerous tissue samples. Specimens with
calcifications are identified for pathology.

At the conclusion of the procedure, X shaped tissue marker clip was
deployed into the biopsy cavity.

Follow-up 2-view RIGHT breast mammogram was performed and dictated
separately.

LEFT BREAST

Site 3: Lesion quadrant: UPPER OUTER QUADRANT LEFT breast, coil
shaped clip

Using sterile technique and 1% lidocaine and 1% lidocaine with
epinephrine as local anesthetic, under stereotactic guidance, a 9
gauge vacuum assisted device was used to perform core needle biopsy
of calcifications in the UPPER-OUTER QUADRANT of the LEFT breast
using a LATERAL to MEDIAL approach. Specimen radiograph was
performed showing calcifications in numerous tissue samples.
Specimens with calcifications are identified for pathology.

At the conclusion of the procedure, coil shaped tissue marker clip
was deployed into the biopsy cavity.

Site 4: Lesion quadrant: LOWER central portion of the LEFT breast,
best shaped clip

Using sterile technique and 1% lidocaine and 1% lidocaine with
epinephrine as local anesthetic, under stereotactic guidance, a 9
gauge vacuum assisted device was used to perform core needle biopsy
of calcifications in the LOWER central portion of the LEFT breast
using a LATERAL to MEDIAL approach. Specimen radiograph was
performed showing calcifications in numerous tissue samples.
Specimens with calcifications are identified for pathology.

At the conclusion of the procedure, X shaped tissue marker clip was
deployed into the biopsy cavity.

Followup two-view LEFT breast mammogram was performed and is
dictated separately.
IMPRESSION: Stereotactic-guided biopsy of 2 sites of calcifications in the RIGHT
breast in 2 sites of calcifications in the LEFT. No apparent
complications.

## 2020-06-09 IMAGING — MG MM BREAST BX W LOC DEV EA AD LESION IMG BX SPEC STEREO GUIDE*L*
6 of 13 series · 6 of 29 positions shown · non-contrast
Comparison: Previous exams.
COMPARISON: Previous exams.

Addendum:
CLINICAL DATA: Patient presents for stereotactic guided core biopsy
of 2 sites calcifications in each breast.

EXAM:
LEFT BREAST STEREOTACTIC CORE NEEDLE BIOPSY x2
RIGHT BREAST STEREOTACTIC CORE NEEDLE BIOPSY x2

[L (1 of 6)]
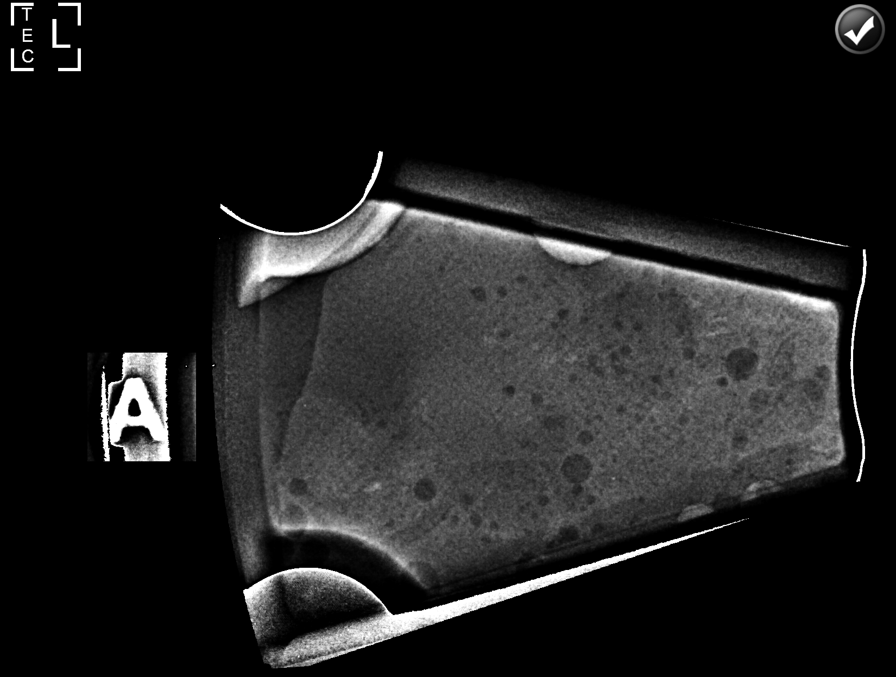

[L (2 of 6)]
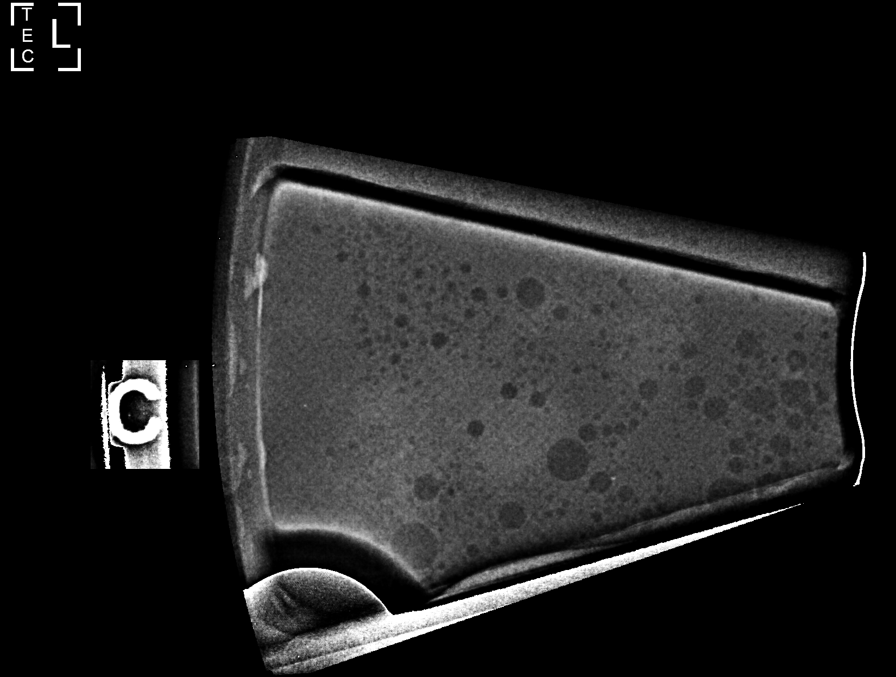

[L (3 of 6)]
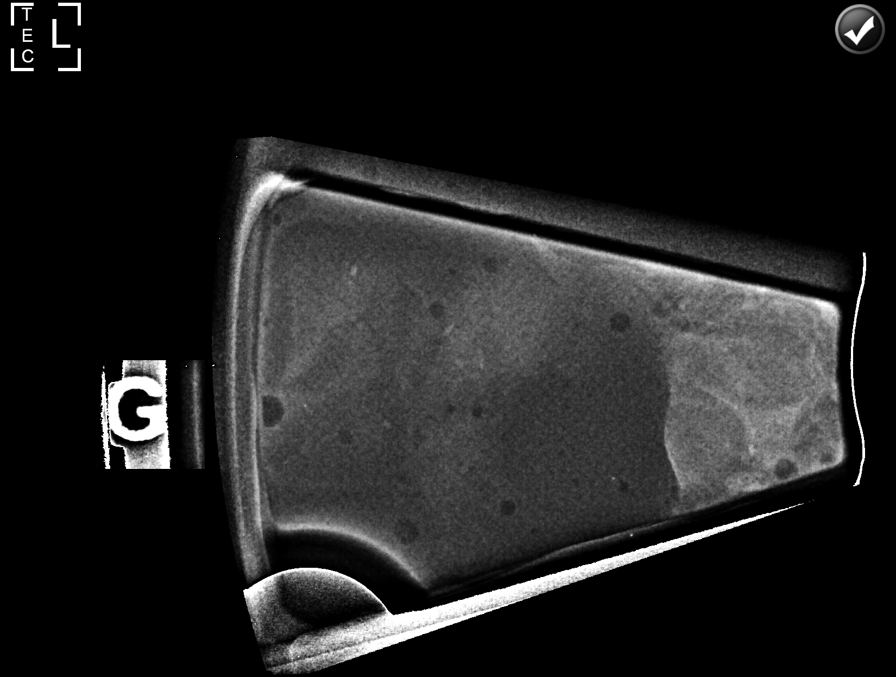

[L (4 of 6)]
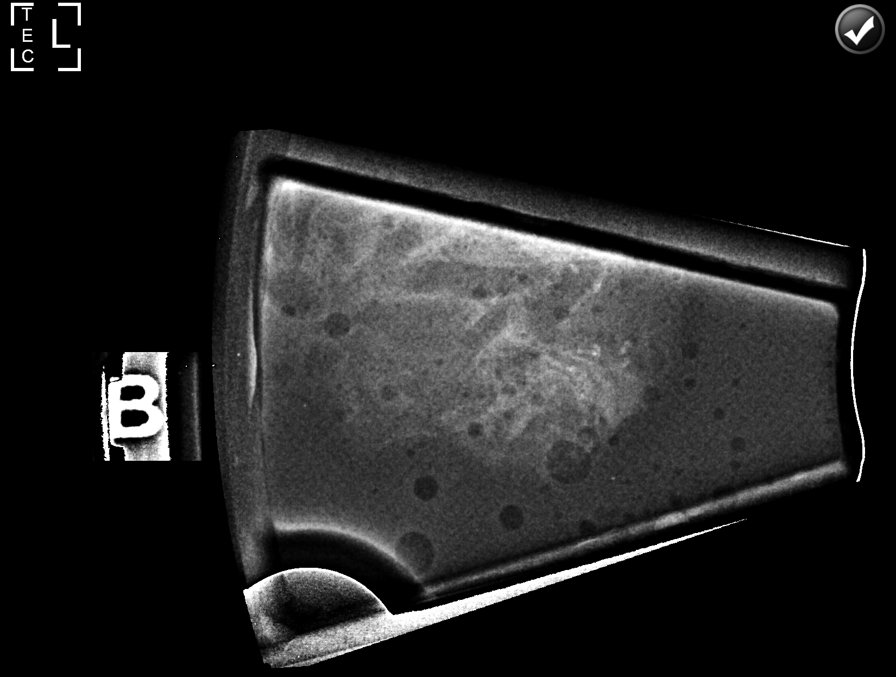

[L (5 of 6)]
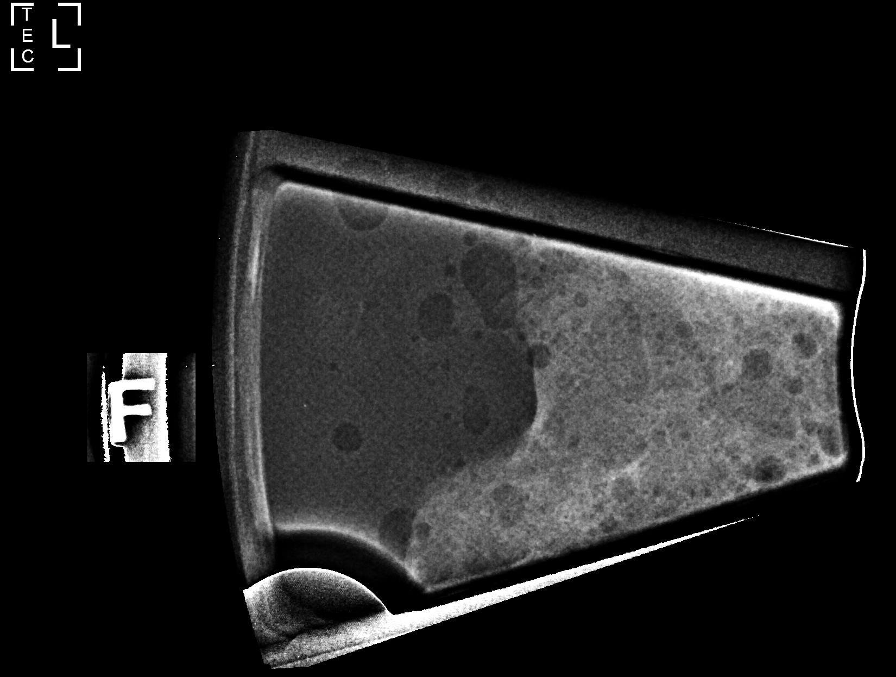

[L (6 of 6)]
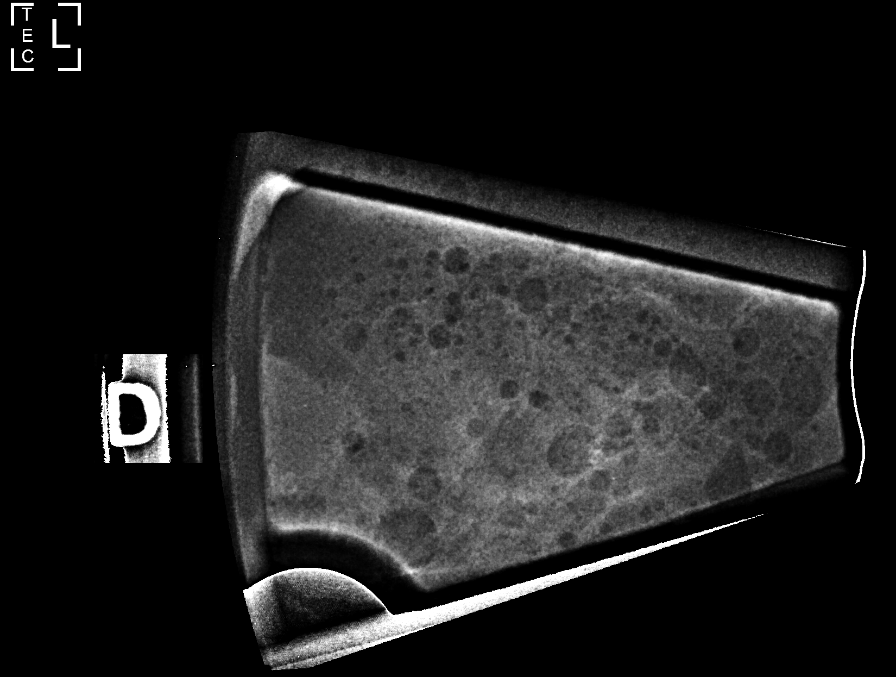

[6 of 29 positions shown; findings below may reference images not displayed]



RIGHT BREAST

Site 1: Lesion quadrant: UPPER INNER QUADRANT RIGHT breast, coil
shaped clip

Using sterile technique and 1% lidocaine and 1% lidocaine with
epinephrine as local anesthetic, under stereotactic guidance, a 9
gauge vacuum assisted device was used to perform core needle biopsy
of calcifications in the UPPER INNER QUADRANT the RIGHT breast using
a craniocaudal approach. Specimen radiograph was performed showing
calcifications in numerous tissue samples. Specimens with
calcifications are identified for pathology.

At the conclusion of the procedure, coil shaped tissue marker clip
was deployed into the biopsy cavity.

Site 2:   Lesion quadrant: UPPER OUTER QUADRANT RIGHT breast

Using sterile technique and 1% lidocaine and 1% lidocaine with
epinephrine as local anesthetic, under stereotactic guidance, a 9
gauge vacuum assisted device was used to perform core needle biopsy
of calcifications in the UPPER-OUTER QUADRANT of the RIGHT breast
using a craniocaudal approach. Specimen radiograph was performed
showing calcifications in numerous tissue samples. Specimens with
calcifications are identified for pathology.

At the conclusion of the procedure, X shaped tissue marker clip was
deployed into the biopsy cavity.

Follow-up 2-view RIGHT breast mammogram was performed and dictated
separately.

LEFT BREAST

Site 3: Lesion quadrant: UPPER OUTER QUADRANT LEFT breast, coil
shaped clip

Using sterile technique and 1% lidocaine and 1% lidocaine with
epinephrine as local anesthetic, under stereotactic guidance, a 9
gauge vacuum assisted device was used to perform core needle biopsy
of calcifications in the UPPER-OUTER QUADRANT of the LEFT breast
using a LATERAL to MEDIAL approach. Specimen radiograph was
performed showing calcifications in numerous tissue samples.
Specimens with calcifications are identified for pathology.

At the conclusion of the procedure, coil shaped tissue marker clip
was deployed into the biopsy cavity.

Site 4: Lesion quadrant: LOWER central portion of the LEFT breast,
best shaped clip

Using sterile technique and 1% lidocaine and 1% lidocaine with
epinephrine as local anesthetic, under stereotactic guidance, a 9
gauge vacuum assisted device was used to perform core needle biopsy
of calcifications in the LOWER central portion of the LEFT breast
using a LATERAL to MEDIAL approach. Specimen radiograph was
performed showing calcifications in numerous tissue samples.
Specimens with calcifications are identified for pathology.

At the conclusion of the procedure, X shaped tissue marker clip was
deployed into the biopsy cavity.

Followup two-view LEFT breast mammogram was performed and is
dictated separately.
IMPRESSION: Stereotactic-guided biopsy of 2 sites of calcifications in the RIGHT
breast in 2 sites of calcifications in the LEFT. No apparent
complications.

ADDENDUM:
Pathology revealed FIBROCYSTIC CHANGES WITH SCLEROSING ADENOSIS AND
CALCIFICATIONS of the RIGHT breast, upper inner quadrant, coil clip.
This was found to be concordant by Dr. [facility].

Pathology revealed FIBROCYSTIC CHANGES WITH SCLEROSING ADENOSIS AND
CALCIFICATIONS, FIBROADENOMATOID CHANGE WITH CALCIFICATIONS of the
RIGHT breast, upper outer quadrant, X clip. This was found to be
concordant by Dr. [facility].

Pathology revealed FIBROCYSTIC CHANGES WITH SCLEROSING ADENOSIS AND
CALCIFICATIONS of the LEFT breast, upper outer, coil clip. This was
found to be concordant by Dr. [facility].

Pathology revealed FIBROCYSTIC CHANGES WITH SCLEROSING ADENOSIS AND
CALCIFICATIONS, FIBROADENOMATOID CHANGE WITH CALCIFICATIONS of the
LEFT breast, lower central, X clip. This was found to be concordant
by Dr. [facility].

Pathology results were discussed with the patient by telephone. The
patient reported doing well after the biopsies with tenderness at
the sites. Post biopsy instructions and care were reviewed and
questions were answered. The patient was encouraged to call The

The patient was instructed to return for bilateral diagnostic
mammography in 6 months at [facility] and informed a reminder
notice would be sent regarding this appointment.

Pathology results reported by [facility] RN on [DATE].



RIGHT BREAST

Site 1: Lesion quadrant: UPPER INNER QUADRANT RIGHT breast, coil
shaped clip

Using sterile technique and 1% lidocaine and 1% lidocaine with
epinephrine as local anesthetic, under stereotactic guidance, a 9
gauge vacuum assisted device was used to perform core needle biopsy
of calcifications in the UPPER INNER QUADRANT the RIGHT breast using
a craniocaudal approach. Specimen radiograph was performed showing
calcifications in numerous tissue samples. Specimens with
calcifications are identified for pathology.

At the conclusion of the procedure, coil shaped tissue marker clip
was deployed into the biopsy cavity.

Site 2:   Lesion quadrant: UPPER OUTER QUADRANT RIGHT breast

Using sterile technique and 1% lidocaine and 1% lidocaine with
epinephrine as local anesthetic, under stereotactic guidance, a 9
gauge vacuum assisted device was used to perform core needle biopsy
of calcifications in the UPPER-OUTER QUADRANT of the RIGHT breast
using a craniocaudal approach. Specimen radiograph was performed
showing calcifications in numerous tissue samples. Specimens with
calcifications are identified for pathology.

At the conclusion of the procedure, X shaped tissue marker clip was
deployed into the biopsy cavity.

Follow-up 2-view RIGHT breast mammogram was performed and dictated
separately.

LEFT BREAST

Site 3: Lesion quadrant: UPPER OUTER QUADRANT LEFT breast, coil
shaped clip

Using sterile technique and 1% lidocaine and 1% lidocaine with
epinephrine as local anesthetic, under stereotactic guidance, a 9
gauge vacuum assisted device was used to perform core needle biopsy
of calcifications in the UPPER-OUTER QUADRANT of the LEFT breast
using a LATERAL to MEDIAL approach. Specimen radiograph was
performed showing calcifications in numerous tissue samples.
Specimens with calcifications are identified for pathology.

At the conclusion of the procedure, coil shaped tissue marker clip
was deployed into the biopsy cavity.

Site 4: Lesion quadrant: LOWER central portion of the LEFT breast,
best shaped clip

Using sterile technique and 1% lidocaine and 1% lidocaine with
epinephrine as local anesthetic, under stereotactic guidance, a 9
gauge vacuum assisted device was used to perform core needle biopsy
of calcifications in the LOWER central portion of the LEFT breast
using a LATERAL to MEDIAL approach. Specimen radiograph was
performed showing calcifications in numerous tissue samples.
Specimens with calcifications are identified for pathology.

At the conclusion of the procedure, X shaped tissue marker clip was
deployed into the biopsy cavity.

Followup two-view LEFT breast mammogram was performed and is
dictated separately.
IMPRESSION: Stereotactic-guided biopsy of 2 sites of calcifications in the RIGHT
breast in 2 sites of calcifications in the LEFT. No apparent
complications.

## 2020-06-09 IMAGING — MG MM BREAST BX W/ LOC DEV EA AD LESION IMAG BX SPEC STEREO GUIDE*R
6 of 7 series · 6 of 7 positions shown · non-contrast
Comparison: Previous exams.
COMPARISON: Previous exams.

Addendum:
CLINICAL DATA: Patient presents for stereotactic guided core biopsy
of 2 sites calcifications in each breast.

EXAM:
LEFT BREAST STEREOTACTIC CORE NEEDLE BIOPSY x2
RIGHT BREAST STEREOTACTIC CORE NEEDLE BIOPSY x2

[R (1 of 6)]
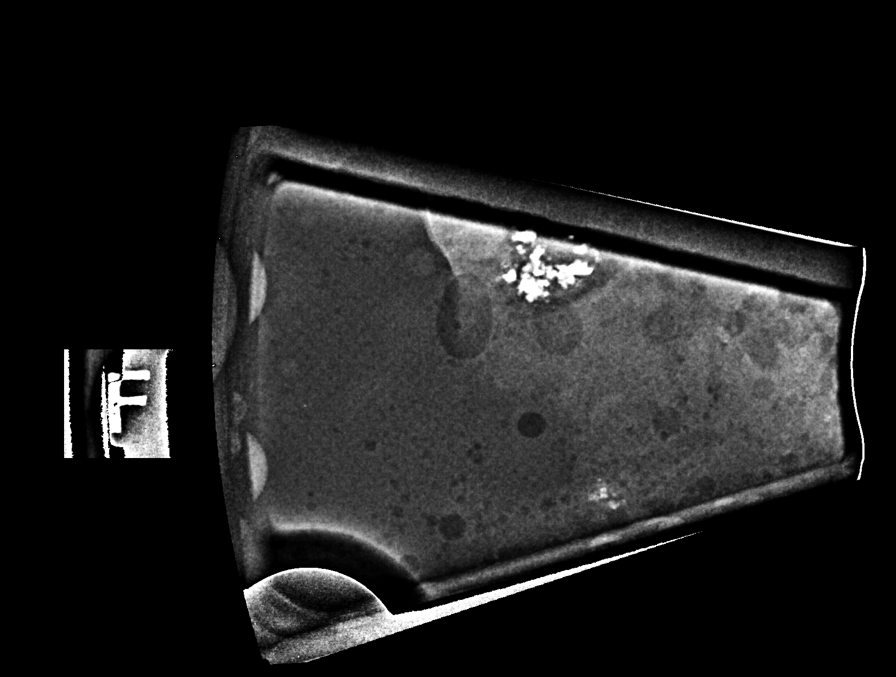

[R (2 of 6)]
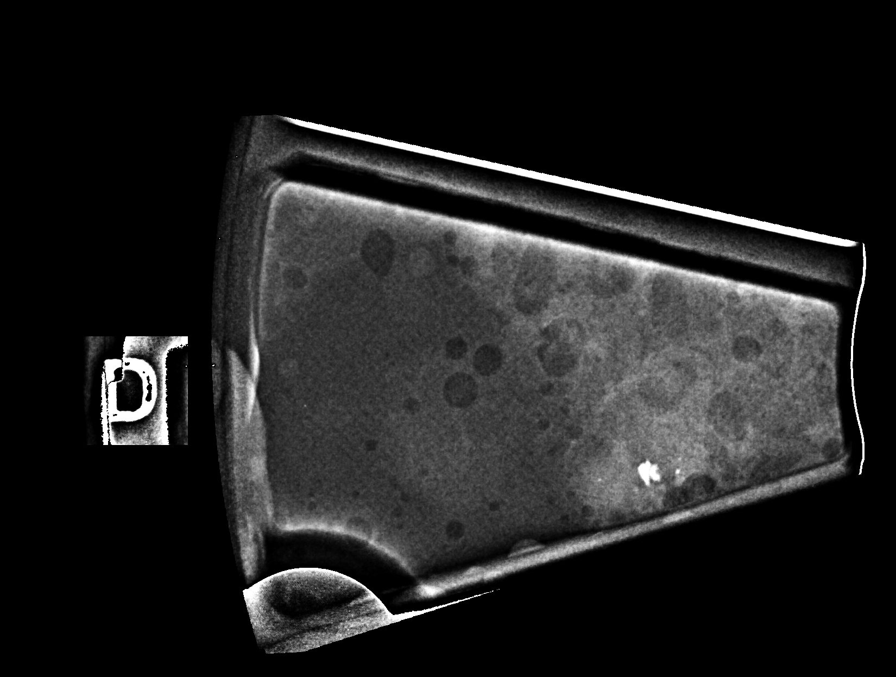

[R (3 of 6)]
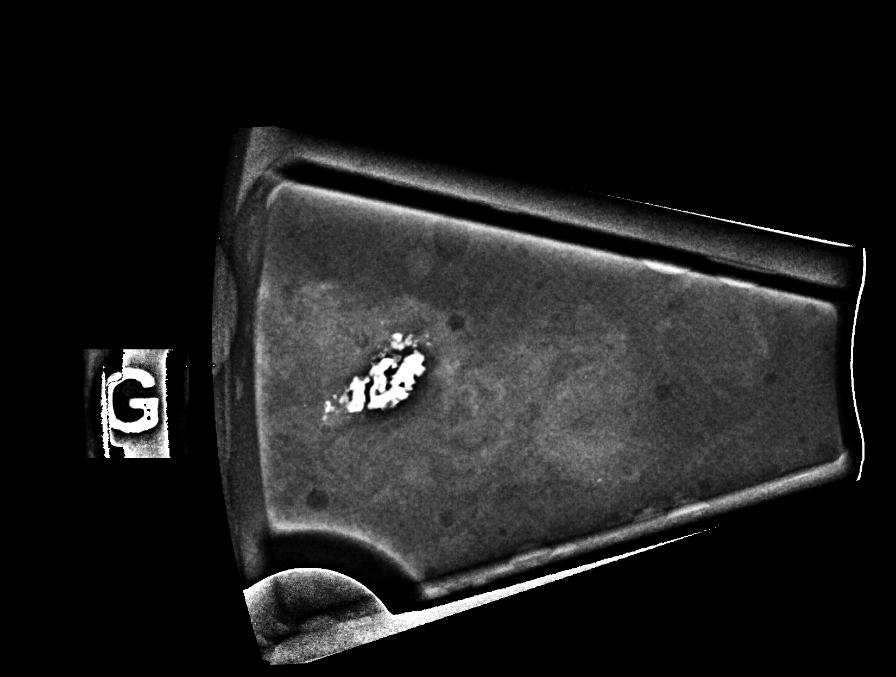

[R (4 of 6)]
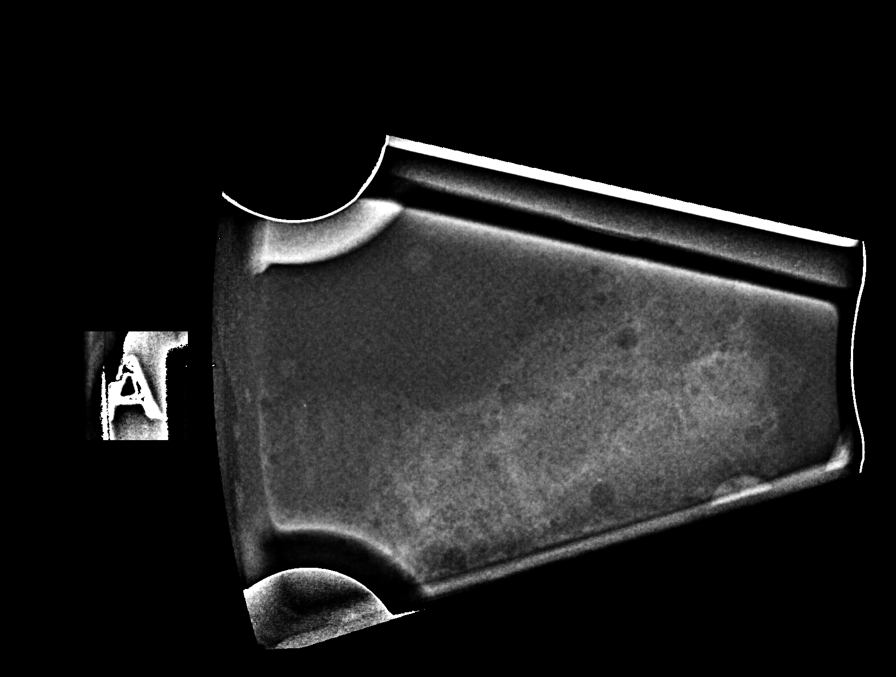

[R (5 of 6)]
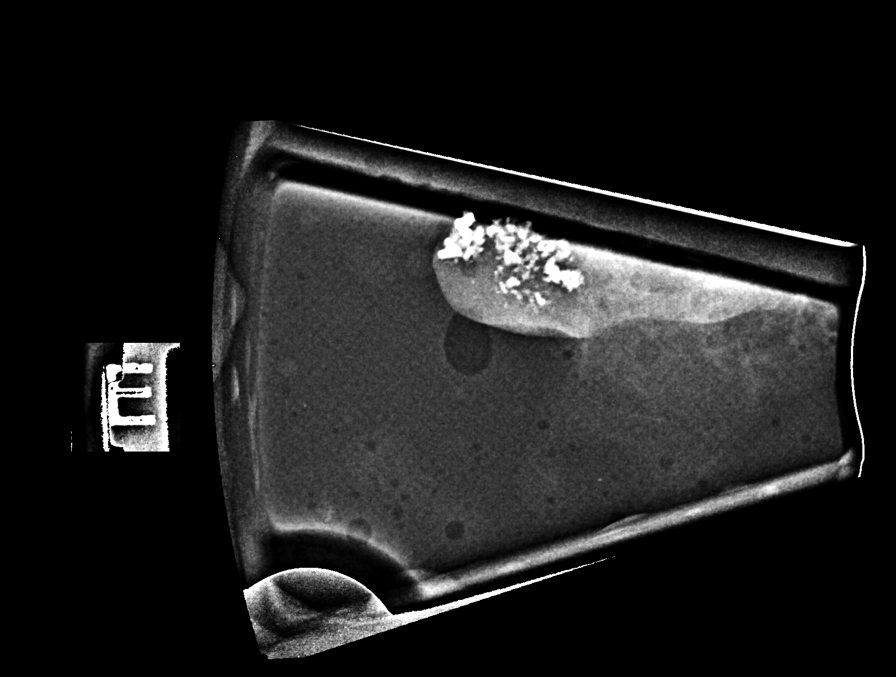

[R (6 of 6)]
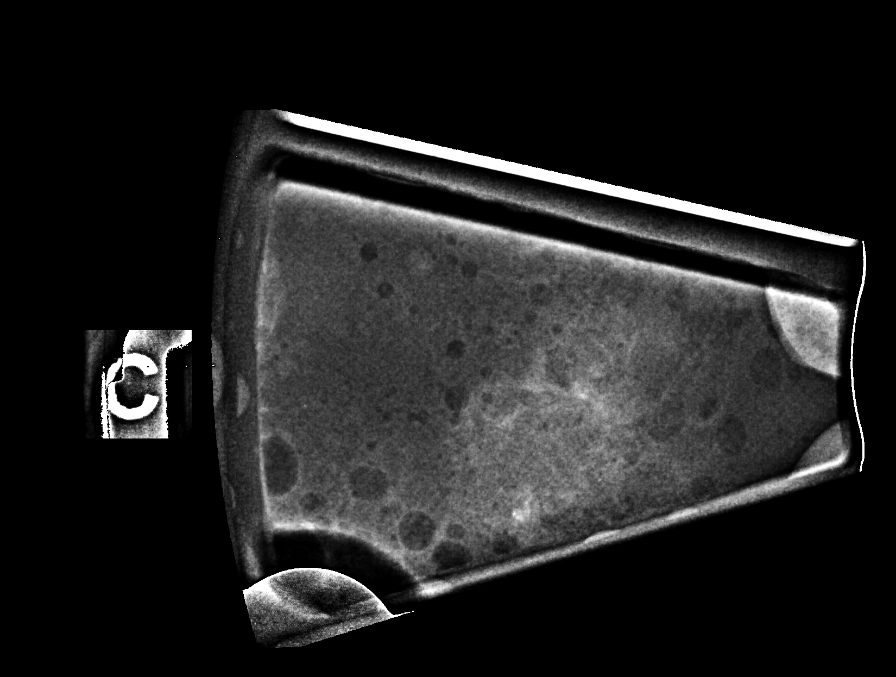

[6 of 7 positions shown; findings below may reference images not displayed]



RIGHT BREAST

Site 1: Lesion quadrant: UPPER INNER QUADRANT RIGHT breast, coil
shaped clip

Using sterile technique and 1% lidocaine and 1% lidocaine with
epinephrine as local anesthetic, under stereotactic guidance, a 9
gauge vacuum assisted device was used to perform core needle biopsy
of calcifications in the UPPER INNER QUADRANT the RIGHT breast using
a craniocaudal approach. Specimen radiograph was performed showing
calcifications in numerous tissue samples. Specimens with
calcifications are identified for pathology.

At the conclusion of the procedure, coil shaped tissue marker clip
was deployed into the biopsy cavity.

Site 2:   Lesion quadrant: UPPER OUTER QUADRANT RIGHT breast

Using sterile technique and 1% lidocaine and 1% lidocaine with
epinephrine as local anesthetic, under stereotactic guidance, a 9
gauge vacuum assisted device was used to perform core needle biopsy
of calcifications in the UPPER-OUTER QUADRANT of the RIGHT breast
using a craniocaudal approach. Specimen radiograph was performed
showing calcifications in numerous tissue samples. Specimens with
calcifications are identified for pathology.

At the conclusion of the procedure, X shaped tissue marker clip was
deployed into the biopsy cavity.

Follow-up 2-view RIGHT breast mammogram was performed and dictated
separately.

LEFT BREAST

Site 3: Lesion quadrant: UPPER OUTER QUADRANT LEFT breast, coil
shaped clip

Using sterile technique and 1% lidocaine and 1% lidocaine with
epinephrine as local anesthetic, under stereotactic guidance, a 9
gauge vacuum assisted device was used to perform core needle biopsy
of calcifications in the UPPER-OUTER QUADRANT of the LEFT breast
using a LATERAL to MEDIAL approach. Specimen radiograph was
performed showing calcifications in numerous tissue samples.
Specimens with calcifications are identified for pathology.

At the conclusion of the procedure, coil shaped tissue marker clip
was deployed into the biopsy cavity.

Site 4: Lesion quadrant: LOWER central portion of the LEFT breast,
best shaped clip

Using sterile technique and 1% lidocaine and 1% lidocaine with
epinephrine as local anesthetic, under stereotactic guidance, a 9
gauge vacuum assisted device was used to perform core needle biopsy
of calcifications in the LOWER central portion of the LEFT breast
using a LATERAL to MEDIAL approach. Specimen radiograph was
performed showing calcifications in numerous tissue samples.
Specimens with calcifications are identified for pathology.

At the conclusion of the procedure, X shaped tissue marker clip was
deployed into the biopsy cavity.

Followup two-view LEFT breast mammogram was performed and is
dictated separately.
IMPRESSION: Stereotactic-guided biopsy of 2 sites of calcifications in the RIGHT
breast in 2 sites of calcifications in the LEFT. No apparent
complications.

ADDENDUM:
Pathology revealed FIBROCYSTIC CHANGES WITH SCLEROSING ADENOSIS AND
CALCIFICATIONS of the RIGHT breast, upper inner quadrant, coil clip.
This was found to be concordant by Dr. [facility].

Pathology revealed FIBROCYSTIC CHANGES WITH SCLEROSING ADENOSIS AND
CALCIFICATIONS, FIBROADENOMATOID CHANGE WITH CALCIFICATIONS of the
RIGHT breast, upper outer quadrant, X clip. This was found to be
concordant by Dr. [facility].

Pathology revealed FIBROCYSTIC CHANGES WITH SCLEROSING ADENOSIS AND
CALCIFICATIONS of the LEFT breast, upper outer, coil clip. This was
found to be concordant by Dr. [facility].

Pathology revealed FIBROCYSTIC CHANGES WITH SCLEROSING ADENOSIS AND
CALCIFICATIONS, FIBROADENOMATOID CHANGE WITH CALCIFICATIONS of the
LEFT breast, lower central, X clip. This was found to be concordant
by Dr. [facility].

Pathology results were discussed with the patient by telephone. The
patient reported doing well after the biopsies with tenderness at
the sites. Post biopsy instructions and care were reviewed and
questions were answered. The patient was encouraged to call The

The patient was instructed to return for bilateral diagnostic
mammography in 6 months at [facility] and informed a reminder
notice would be sent regarding this appointment.

Pathology results reported by [facility] RN on [DATE].



RIGHT BREAST

Site 1: Lesion quadrant: UPPER INNER QUADRANT RIGHT breast, coil
shaped clip

Using sterile technique and 1% lidocaine and 1% lidocaine with
epinephrine as local anesthetic, under stereotactic guidance, a 9
gauge vacuum assisted device was used to perform core needle biopsy
of calcifications in the UPPER INNER QUADRANT the RIGHT breast using
a craniocaudal approach. Specimen radiograph was performed showing
calcifications in numerous tissue samples. Specimens with
calcifications are identified for pathology.

At the conclusion of the procedure, coil shaped tissue marker clip
was deployed into the biopsy cavity.

Site 2:   Lesion quadrant: UPPER OUTER QUADRANT RIGHT breast

Using sterile technique and 1% lidocaine and 1% lidocaine with
epinephrine as local anesthetic, under stereotactic guidance, a 9
gauge vacuum assisted device was used to perform core needle biopsy
of calcifications in the UPPER-OUTER QUADRANT of the RIGHT breast
using a craniocaudal approach. Specimen radiograph was performed
showing calcifications in numerous tissue samples. Specimens with
calcifications are identified for pathology.

At the conclusion of the procedure, X shaped tissue marker clip was
deployed into the biopsy cavity.

Follow-up 2-view RIGHT breast mammogram was performed and dictated
separately.

LEFT BREAST

Site 3: Lesion quadrant: UPPER OUTER QUADRANT LEFT breast, coil
shaped clip

Using sterile technique and 1% lidocaine and 1% lidocaine with
epinephrine as local anesthetic, under stereotactic guidance, a 9
gauge vacuum assisted device was used to perform core needle biopsy
of calcifications in the UPPER-OUTER QUADRANT of the LEFT breast
using a LATERAL to MEDIAL approach. Specimen radiograph was
performed showing calcifications in numerous tissue samples.
Specimens with calcifications are identified for pathology.

At the conclusion of the procedure, coil shaped tissue marker clip
was deployed into the biopsy cavity.

Site 4: Lesion quadrant: LOWER central portion of the LEFT breast,
best shaped clip

Using sterile technique and 1% lidocaine and 1% lidocaine with
epinephrine as local anesthetic, under stereotactic guidance, a 9
gauge vacuum assisted device was used to perform core needle biopsy
of calcifications in the LOWER central portion of the LEFT breast
using a LATERAL to MEDIAL approach. Specimen radiograph was
performed showing calcifications in numerous tissue samples.
Specimens with calcifications are identified for pathology.

At the conclusion of the procedure, X shaped tissue marker clip was
deployed into the biopsy cavity.

Followup two-view LEFT breast mammogram was performed and is
dictated separately.
IMPRESSION: Stereotactic-guided biopsy of 2 sites of calcifications in the RIGHT
breast in 2 sites of calcifications in the LEFT. No apparent
complications.

## 2020-06-09 IMAGING — MG MM BREAST BX W LOC DEV 1ST LESION IMAGE BX SPEC STEREO GUIDE*L*
6 of 14 series · 6 of 38 positions shown · non-contrast
Comparison: Previous exams.
COMPARISON: Previous exams.

Addendum:
CLINICAL DATA: Patient presents for stereotactic guided core biopsy
of 2 sites calcifications in each breast.

EXAM:
LEFT BREAST STEREOTACTIC CORE NEEDLE BIOPSY x2
RIGHT BREAST STEREOTACTIC CORE NEEDLE BIOPSY x2

[L (1 of 6)]
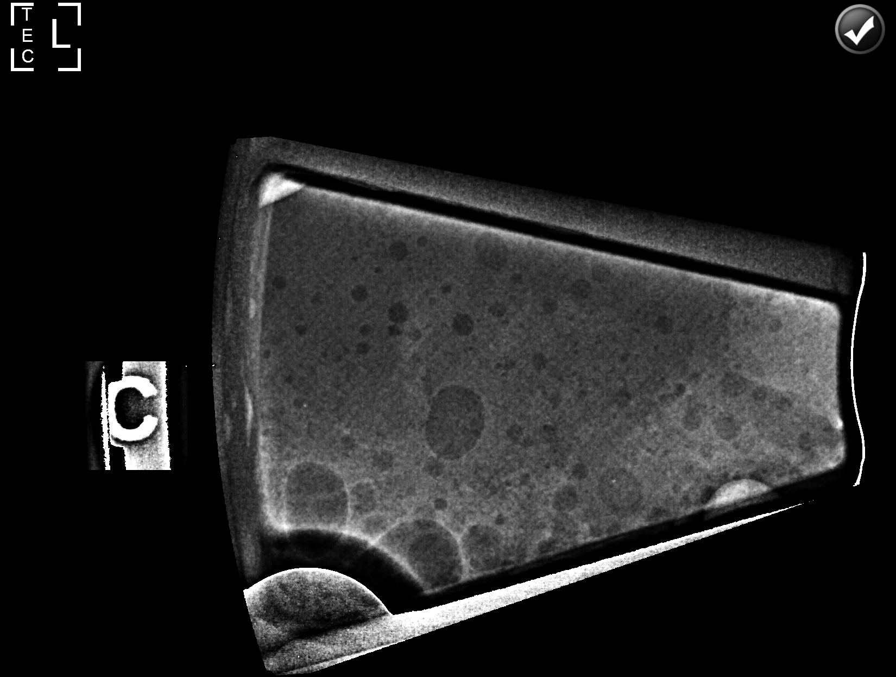

[L (2 of 6)]
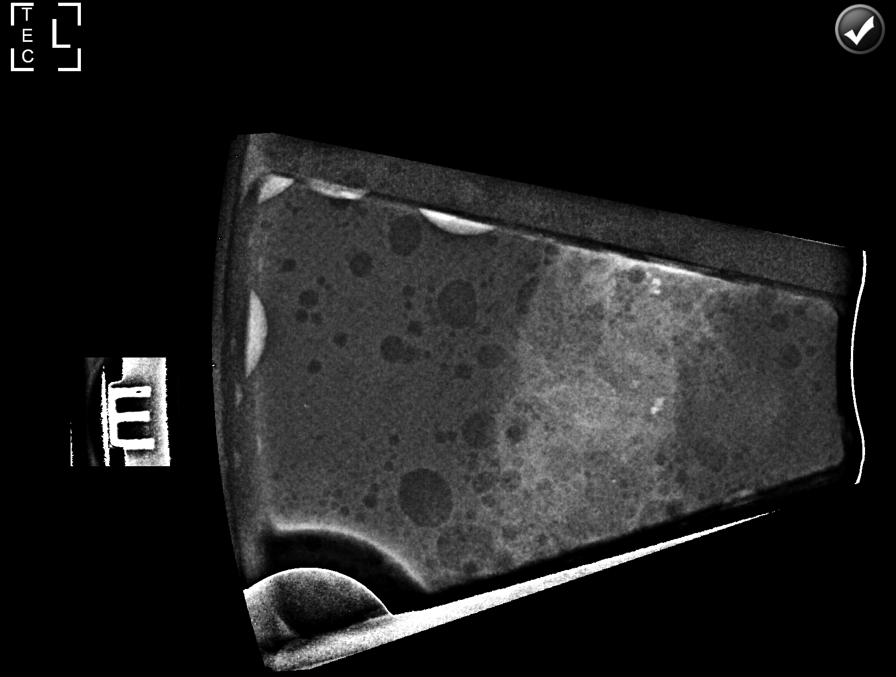

[L (3 of 6)]
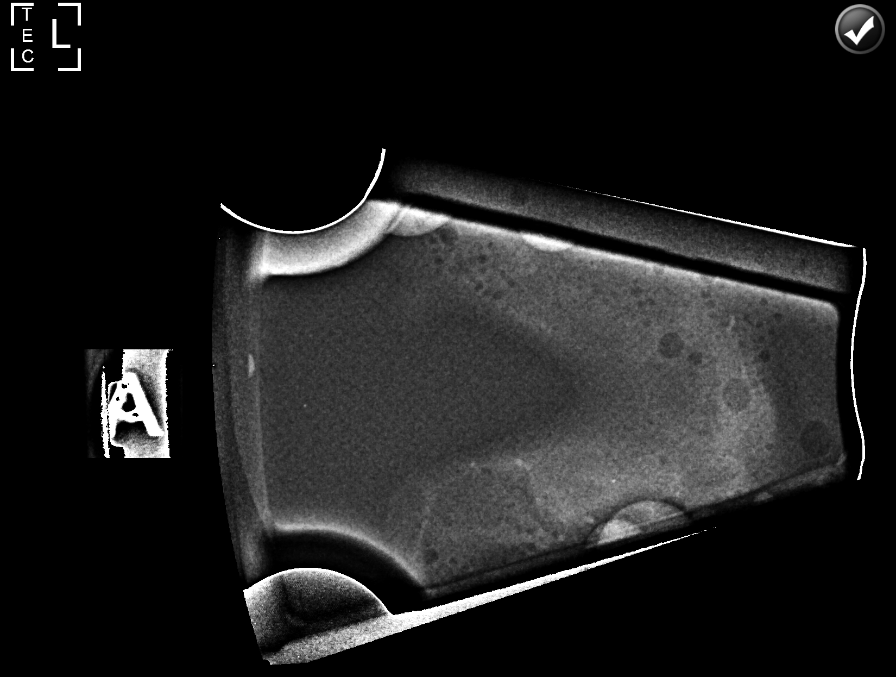

[L (4 of 6)]
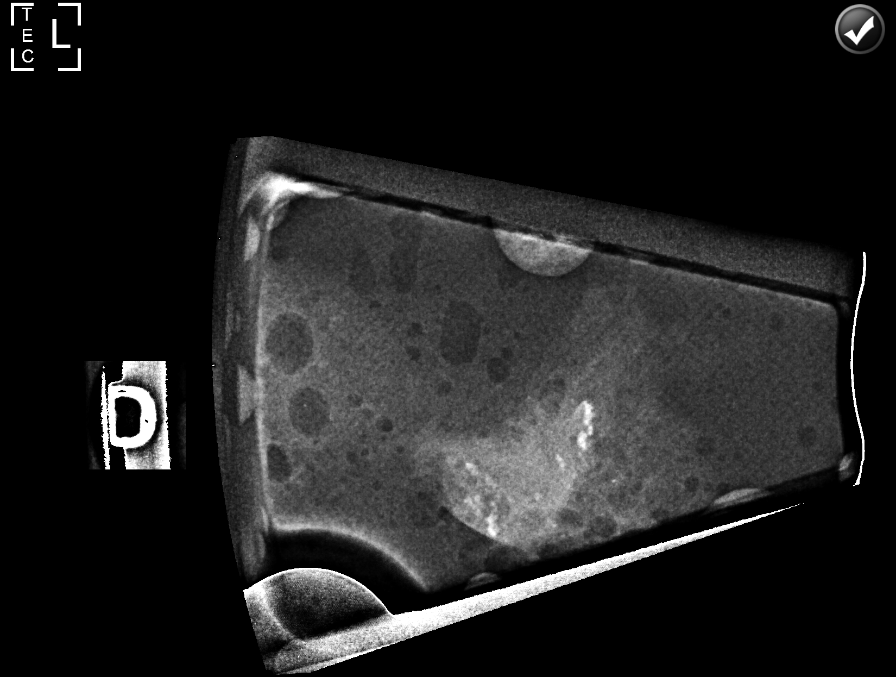

[L (5 of 6)]
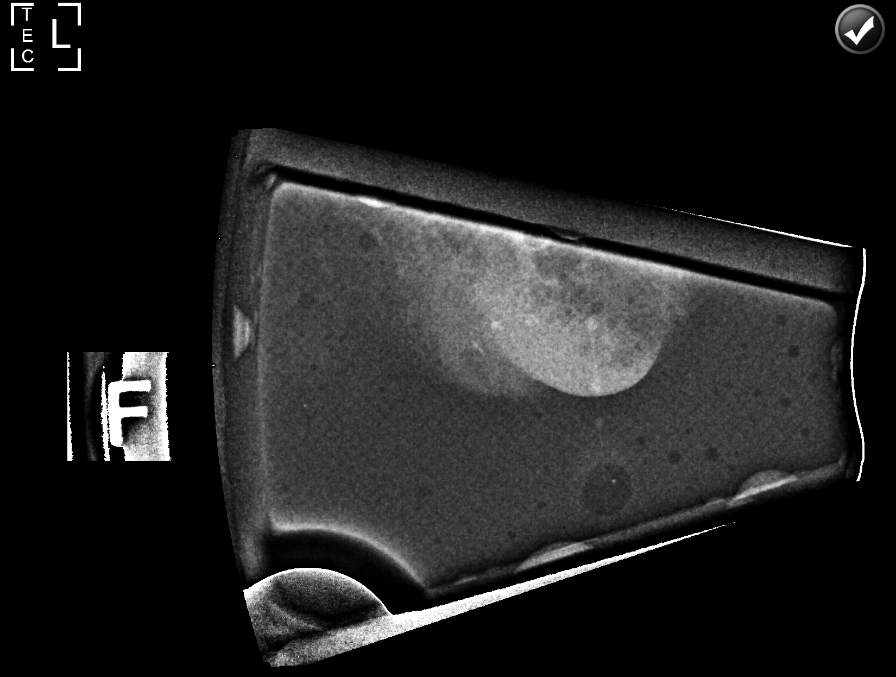

[L (6 of 6)]
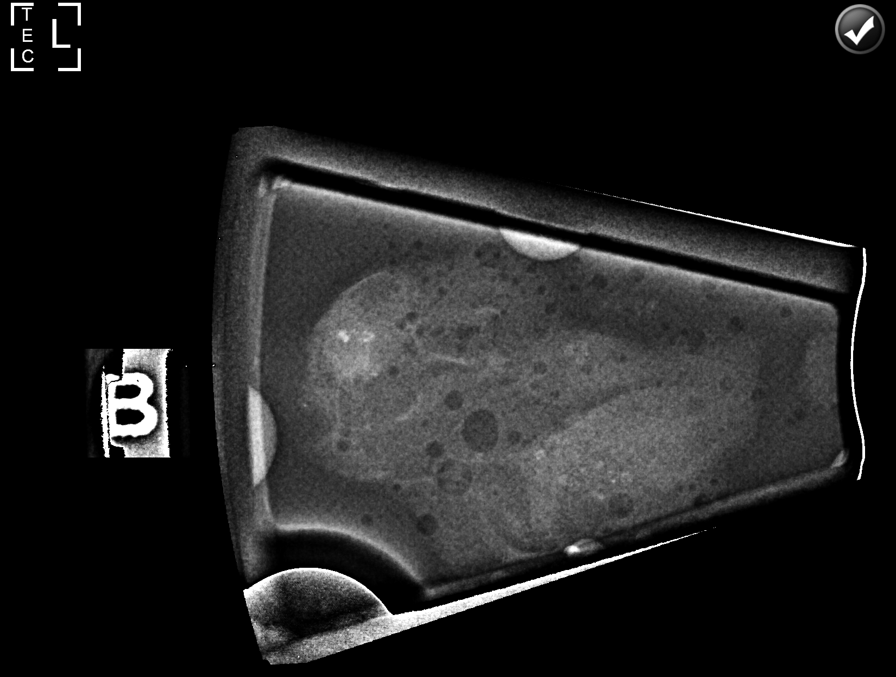

[6 of 38 positions shown; findings below may reference images not displayed]



RIGHT BREAST

Site 1: Lesion quadrant: UPPER INNER QUADRANT RIGHT breast, coil
shaped clip

Using sterile technique and 1% lidocaine and 1% lidocaine with
epinephrine as local anesthetic, under stereotactic guidance, a 9
gauge vacuum assisted device was used to perform core needle biopsy
of calcifications in the UPPER INNER QUADRANT the RIGHT breast using
a craniocaudal approach. Specimen radiograph was performed showing
calcifications in numerous tissue samples. Specimens with
calcifications are identified for pathology.

At the conclusion of the procedure, coil shaped tissue marker clip
was deployed into the biopsy cavity.

Site 2:   Lesion quadrant: UPPER OUTER QUADRANT RIGHT breast

Using sterile technique and 1% lidocaine and 1% lidocaine with
epinephrine as local anesthetic, under stereotactic guidance, a 9
gauge vacuum assisted device was used to perform core needle biopsy
of calcifications in the UPPER-OUTER QUADRANT of the RIGHT breast
using a craniocaudal approach. Specimen radiograph was performed
showing calcifications in numerous tissue samples. Specimens with
calcifications are identified for pathology.

At the conclusion of the procedure, X shaped tissue marker clip was
deployed into the biopsy cavity.

Follow-up 2-view RIGHT breast mammogram was performed and dictated
separately.

LEFT BREAST

Site 3: Lesion quadrant: UPPER OUTER QUADRANT LEFT breast, coil
shaped clip

Using sterile technique and 1% lidocaine and 1% lidocaine with
epinephrine as local anesthetic, under stereotactic guidance, a 9
gauge vacuum assisted device was used to perform core needle biopsy
of calcifications in the UPPER-OUTER QUADRANT of the LEFT breast
using a LATERAL to MEDIAL approach. Specimen radiograph was
performed showing calcifications in numerous tissue samples.
Specimens with calcifications are identified for pathology.

At the conclusion of the procedure, coil shaped tissue marker clip
was deployed into the biopsy cavity.

Site 4: Lesion quadrant: LOWER central portion of the LEFT breast,
best shaped clip

Using sterile technique and 1% lidocaine and 1% lidocaine with
epinephrine as local anesthetic, under stereotactic guidance, a 9
gauge vacuum assisted device was used to perform core needle biopsy
of calcifications in the LOWER central portion of the LEFT breast
using a LATERAL to MEDIAL approach. Specimen radiograph was
performed showing calcifications in numerous tissue samples.
Specimens with calcifications are identified for pathology.

At the conclusion of the procedure, X shaped tissue marker clip was
deployed into the biopsy cavity.

Followup two-view LEFT breast mammogram was performed and is
dictated separately.
IMPRESSION: Stereotactic-guided biopsy of 2 sites of calcifications in the RIGHT
breast in 2 sites of calcifications in the LEFT. No apparent
complications.

ADDENDUM:
Pathology revealed FIBROCYSTIC CHANGES WITH SCLEROSING ADENOSIS AND
CALCIFICATIONS of the RIGHT breast, upper inner quadrant, coil clip.
This was found to be concordant by Dr. [facility].

Pathology revealed FIBROCYSTIC CHANGES WITH SCLEROSING ADENOSIS AND
CALCIFICATIONS, FIBROADENOMATOID CHANGE WITH CALCIFICATIONS of the
RIGHT breast, upper outer quadrant, X clip. This was found to be
concordant by Dr. [facility].

Pathology revealed FIBROCYSTIC CHANGES WITH SCLEROSING ADENOSIS AND
CALCIFICATIONS of the LEFT breast, upper outer, coil clip. This was
found to be concordant by Dr. [facility].

Pathology revealed FIBROCYSTIC CHANGES WITH SCLEROSING ADENOSIS AND
CALCIFICATIONS, FIBROADENOMATOID CHANGE WITH CALCIFICATIONS of the
LEFT breast, lower central, X clip. This was found to be concordant
by Dr. [facility].

Pathology results were discussed with the patient by telephone. The
patient reported doing well after the biopsies with tenderness at
the sites. Post biopsy instructions and care were reviewed and
questions were answered. The patient was encouraged to call The

The patient was instructed to return for bilateral diagnostic
mammography in 6 months at [facility] and informed a reminder
notice would be sent regarding this appointment.

Pathology results reported by [facility] RN on [DATE].



RIGHT BREAST

Site 1: Lesion quadrant: UPPER INNER QUADRANT RIGHT breast, coil
shaped clip

Using sterile technique and 1% lidocaine and 1% lidocaine with
epinephrine as local anesthetic, under stereotactic guidance, a 9
gauge vacuum assisted device was used to perform core needle biopsy
of calcifications in the UPPER INNER QUADRANT the RIGHT breast using
a craniocaudal approach. Specimen radiograph was performed showing
calcifications in numerous tissue samples. Specimens with
calcifications are identified for pathology.

At the conclusion of the procedure, coil shaped tissue marker clip
was deployed into the biopsy cavity.

Site 2:   Lesion quadrant: UPPER OUTER QUADRANT RIGHT breast

Using sterile technique and 1% lidocaine and 1% lidocaine with
epinephrine as local anesthetic, under stereotactic guidance, a 9
gauge vacuum assisted device was used to perform core needle biopsy
of calcifications in the UPPER-OUTER QUADRANT of the RIGHT breast
using a craniocaudal approach. Specimen radiograph was performed
showing calcifications in numerous tissue samples. Specimens with
calcifications are identified for pathology.

At the conclusion of the procedure, X shaped tissue marker clip was
deployed into the biopsy cavity.

Follow-up 2-view RIGHT breast mammogram was performed and dictated
separately.

LEFT BREAST

Site 3: Lesion quadrant: UPPER OUTER QUADRANT LEFT breast, coil
shaped clip

Using sterile technique and 1% lidocaine and 1% lidocaine with
epinephrine as local anesthetic, under stereotactic guidance, a 9
gauge vacuum assisted device was used to perform core needle biopsy
of calcifications in the UPPER-OUTER QUADRANT of the LEFT breast
using a LATERAL to MEDIAL approach. Specimen radiograph was
performed showing calcifications in numerous tissue samples.
Specimens with calcifications are identified for pathology.

At the conclusion of the procedure, coil shaped tissue marker clip
was deployed into the biopsy cavity.

Site 4: Lesion quadrant: LOWER central portion of the LEFT breast,
best shaped clip

Using sterile technique and 1% lidocaine and 1% lidocaine with
epinephrine as local anesthetic, under stereotactic guidance, a 9
gauge vacuum assisted device was used to perform core needle biopsy
of calcifications in the LOWER central portion of the LEFT breast
using a LATERAL to MEDIAL approach. Specimen radiograph was
performed showing calcifications in numerous tissue samples.
Specimens with calcifications are identified for pathology.

At the conclusion of the procedure, X shaped tissue marker clip was
deployed into the biopsy cavity.

Followup two-view LEFT breast mammogram was performed and is
dictated separately.
IMPRESSION: Stereotactic-guided biopsy of 2 sites of calcifications in the RIGHT
breast in 2 sites of calcifications in the LEFT. No apparent
complications.

## 2020-11-03 ENCOUNTER — Inpatient Hospital Stay (HOSPITAL_COMMUNITY)
Admission: EM | Admit: 2020-11-03 | Discharge: 2020-11-09 | DRG: 660 | Disposition: A | Discharge status: Home / Self Care | Payer: Medicare HMO | Attending: Internal Medicine | Admitting: Internal Medicine

## 2020-11-03 ENCOUNTER — Other Ambulatory Visit: Payer: Self-pay

## 2020-11-03 ENCOUNTER — Encounter (HOSPITAL_COMMUNITY): Payer: Self-pay | Admitting: Emergency Medicine

## 2020-11-03 DIAGNOSIS — Z87891 Personal history of nicotine dependence: Secondary | ICD-10-CM

## 2020-11-03 DIAGNOSIS — D494 Neoplasm of unspecified behavior of bladder: Secondary | ICD-10-CM | POA: Diagnosis present

## 2020-11-03 DIAGNOSIS — K5900 Constipation, unspecified: Secondary | ICD-10-CM | POA: Diagnosis present

## 2020-11-03 DIAGNOSIS — Z794 Long term (current) use of insulin: Secondary | ICD-10-CM

## 2020-11-03 DIAGNOSIS — Z882 Allergy status to sulfonamides status: Secondary | ICD-10-CM

## 2020-11-03 DIAGNOSIS — E876 Hypokalemia: Secondary | ICD-10-CM | POA: Diagnosis present

## 2020-11-03 DIAGNOSIS — E441 Mild protein-calorie malnutrition: Secondary | ICD-10-CM | POA: Diagnosis present

## 2020-11-03 DIAGNOSIS — I10 Essential (primary) hypertension: Secondary | ICD-10-CM | POA: Diagnosis present

## 2020-11-03 DIAGNOSIS — Z8249 Family history of ischemic heart disease and other diseases of the circulatory system: Secondary | ICD-10-CM

## 2020-11-03 DIAGNOSIS — Z01818 Encounter for other preprocedural examination: Secondary | ICD-10-CM

## 2020-11-03 DIAGNOSIS — Z8673 Personal history of transient ischemic attack (TIA), and cerebral infarction without residual deficits: Secondary | ICD-10-CM

## 2020-11-03 DIAGNOSIS — E8809 Other disorders of plasma-protein metabolism, not elsewhere classified: Secondary | ICD-10-CM | POA: Diagnosis present

## 2020-11-03 DIAGNOSIS — Z79899 Other long term (current) drug therapy: Secondary | ICD-10-CM

## 2020-11-03 DIAGNOSIS — R32 Unspecified urinary incontinence: Secondary | ICD-10-CM | POA: Diagnosis present

## 2020-11-03 DIAGNOSIS — Z20822 Contact with and (suspected) exposure to covid-19: Secondary | ICD-10-CM | POA: Diagnosis present

## 2020-11-03 DIAGNOSIS — Z6823 Body mass index (BMI) 23.0-23.9, adult: Secondary | ICD-10-CM

## 2020-11-03 DIAGNOSIS — IMO0002 Reserved for concepts with insufficient information to code with codable children: Secondary | ICD-10-CM | POA: Diagnosis present

## 2020-11-03 DIAGNOSIS — Z880 Allergy status to penicillin: Secondary | ICD-10-CM

## 2020-11-03 DIAGNOSIS — E785 Hyperlipidemia, unspecified: Secondary | ICD-10-CM | POA: Diagnosis present

## 2020-11-03 DIAGNOSIS — N179 Acute kidney failure, unspecified: Secondary | ICD-10-CM | POA: Diagnosis not present

## 2020-11-03 DIAGNOSIS — N3289 Other specified disorders of bladder: Secondary | ICD-10-CM

## 2020-11-03 DIAGNOSIS — E1165 Type 2 diabetes mellitus with hyperglycemia: Secondary | ICD-10-CM | POA: Diagnosis present

## 2020-11-03 DIAGNOSIS — N1330 Unspecified hydronephrosis: Secondary | ICD-10-CM | POA: Diagnosis present

## 2020-11-03 DIAGNOSIS — D649 Anemia, unspecified: Secondary | ICD-10-CM | POA: Diagnosis present

## 2020-11-03 HISTORY — DX: Cerebral infarction, unspecified: I63.9

## 2020-11-03 LAB — CBC
HCT: 33.2 % — ABNORMAL LOW (ref 36.0–46.0)
Hemoglobin: 11.1 g/dL — ABNORMAL LOW (ref 12.0–15.0)
MCH: 29.4 pg (ref 26.0–34.0)
MCHC: 33.4 g/dL (ref 30.0–36.0)
MCV: 87.8 fL (ref 80.0–100.0)
Platelets: 210 10*3/uL (ref 150–400)
RBC: 3.78 MIL/uL — ABNORMAL LOW (ref 3.87–5.11)
RDW: 13.2 % (ref 11.5–15.5)
WBC: 9.5 10*3/uL (ref 4.0–10.5)
nRBC: 0 % (ref 0.0–0.2)

## 2020-11-03 LAB — URINALYSIS, ROUTINE W REFLEX MICROSCOPIC
Bilirubin Urine: NEGATIVE
Glucose, UA: NEGATIVE mg/dL
Ketones, ur: NEGATIVE mg/dL
Leukocytes,Ua: NEGATIVE
Nitrite: NEGATIVE
Protein, ur: NEGATIVE mg/dL
Specific Gravity, Urine: 1.009 (ref 1.005–1.030)
pH: 6 (ref 5.0–8.0)

## 2020-11-03 LAB — COMPREHENSIVE METABOLIC PANEL
ALT: 15 U/L (ref 0–44)
AST: 16 U/L (ref 15–41)
Albumin: 3.4 g/dL — ABNORMAL LOW (ref 3.5–5.0)
Alkaline Phosphatase: 73 U/L (ref 38–126)
Anion gap: 10 (ref 5–15)
BUN: 24 mg/dL — ABNORMAL HIGH (ref 8–23)
CO2: 26 mmol/L (ref 22–32)
Calcium: 9.2 mg/dL (ref 8.9–10.3)
Chloride: 104 mmol/L (ref 98–111)
Creatinine, Ser: 1.95 mg/dL — ABNORMAL HIGH (ref 0.44–1.00)
GFR, Estimated: 28 mL/min — ABNORMAL LOW (ref >60)
Glucose, Bld: 172 mg/dL — ABNORMAL HIGH (ref 70–99)
Potassium: 2.4 mmol/L — CL (ref 3.5–5.1)
Sodium: 140 mmol/L (ref 135–145)
Total Bilirubin: 0.3 mg/dL (ref 0.3–1.2)
Total Protein: 6.9 g/dL (ref 6.5–8.1)

## 2020-11-03 LAB — HEMOGLOBIN A1C
Hgb A1c MFr Bld: 5.5 % (ref 4.8–5.6)
Mean Plasma Glucose: 111.15 mg/dL

## 2020-11-03 LAB — CBG MONITORING, ED
Glucose-Capillary: 157 mg/dL — ABNORMAL HIGH (ref 70–99)
Glucose-Capillary: 110 mg/dL — ABNORMAL HIGH (ref 70–99)

## 2020-11-03 LAB — LIPASE, BLOOD: Lipase: 31 U/L (ref 11–51)

## 2020-11-03 LAB — PHOSPHORUS
Phosphorus: 4.1 mg/dL (ref 2.5–4.6)
Phosphorus: 4.3 mg/dL (ref 2.5–4.6)

## 2020-11-03 LAB — MAGNESIUM: Magnesium: 1.7 mg/dL (ref 1.7–2.4)

## 2020-11-03 MED ORDER — PANTOPRAZOLE SODIUM 40 MG PO TBEC
40.0000 mg | DELAYED_RELEASE_TABLET | Freq: Every day | ORAL | Status: DC
  Administered 2020-11-03 – 2020-11-09 (×6): 40 mg via ORAL
  Filled 2020-11-03 (×6): qty 1

## 2020-11-03 MED ORDER — ACETAMINOPHEN 325 MG PO TABS
650.0000 mg | ORAL_TABLET | Freq: Four times a day (QID) | ORAL | Status: DC | PRN
  Administered 2020-11-03 – 2020-11-08 (×4): 650 mg via ORAL
  Filled 2020-11-03 (×5): qty 2

## 2020-11-03 MED ORDER — SORBITOL 70 % SOLN: 960.0000 mL | TOPICAL_OIL | Freq: Once | ORAL | Status: DC

## 2020-11-03 MED ORDER — AMLODIPINE BESYLATE 5 MG PO TABS
5.0000 mg | ORAL_TABLET | Freq: Every day | ORAL | Status: DC
  Administered 2020-11-04 – 2020-11-06 (×3): 5 mg via ORAL
  Filled 2020-11-03 (×3): qty 1

## 2020-11-03 MED ORDER — PROCHLORPERAZINE EDISYLATE 10 MG/2ML IJ SOLN
5.0000 mg | INTRAMUSCULAR | Status: DC | PRN
  Administered 2020-11-04 – 2020-11-07 (×2): 5 mg via INTRAVENOUS
  Filled 2020-11-03 (×2): qty 2

## 2020-11-03 MED ORDER — SODIUM CHLORIDE 0.9 % IV SOLN
1.0000 g | INTRAVENOUS | Status: AC
  Administered 2020-11-04 – 2020-11-05 (×2): 1 g via INTRAVENOUS
  Filled 2020-11-03 (×2): qty 10

## 2020-11-03 MED ORDER — POTASSIUM CHLORIDE CRYS ER 20 MEQ PO TBCR
20.0000 meq | EXTENDED_RELEASE_TABLET | Freq: Once | ORAL | Status: AC
  Administered 2020-11-03: 20 meq via ORAL
  Filled 2020-11-03: qty 1

## 2020-11-03 MED ORDER — INSULIN ASPART 100 UNIT/ML ~~LOC~~ SOLN
0.0000 [IU] | Freq: Three times a day (TID) | SUBCUTANEOUS | Status: DC
  Administered 2020-11-04: 3 [IU] via SUBCUTANEOUS
  Administered 2020-11-04: 5 [IU] via SUBCUTANEOUS
  Administered 2020-11-05 (×2): 2 [IU] via SUBCUTANEOUS
  Administered 2020-11-05: 3 [IU] via SUBCUTANEOUS
  Administered 2020-11-06: 2 [IU] via SUBCUTANEOUS
  Administered 2020-11-06: 3 [IU] via SUBCUTANEOUS
  Administered 2020-11-07: 8 [IU] via SUBCUTANEOUS
  Administered 2020-11-08: 2 [IU] via SUBCUTANEOUS
  Administered 2020-11-08: 3 [IU] via SUBCUTANEOUS
  Administered 2020-11-08 – 2020-11-09 (×3): 2 [IU] via SUBCUTANEOUS

## 2020-11-03 MED ORDER — MAGNESIUM SULFATE IN D5W 1-5 GM/100ML-% IV SOLN
1.0000 g | Freq: Once | INTRAVENOUS | Status: AC
  Administered 2020-11-04: 1 g via INTRAVENOUS
  Filled 2020-11-03: qty 100

## 2020-11-03 MED ORDER — POTASSIUM CHLORIDE IN NACL 40-0.9 MEQ/L-% IV SOLN
INTRAVENOUS | Status: AC
  Filled 2020-11-03: qty 1000

## 2020-11-03 MED ORDER — METOPROLOL TARTRATE 5 MG/5ML IV SOLN
5.0000 mg | Freq: Once | INTRAVENOUS | Status: AC
  Administered 2020-11-03: 5 mg via INTRAVENOUS
  Filled 2020-11-03: qty 5

## 2020-11-03 MED ORDER — SORBITOL 70 % SOLN
960.0000 mL | TOPICAL_OIL | Freq: Once | ORAL | Status: DC
  Filled 2020-11-03 (×2): qty 473

## 2020-11-03 MED ORDER — SENNOSIDES-DOCUSATE SODIUM 8.6-50 MG PO TABS
1.0000 | ORAL_TABLET | Freq: Every day | ORAL | Status: DC
  Administered 2020-11-03 – 2020-11-08 (×5): 1 via ORAL
  Filled 2020-11-03 (×5): qty 1

## 2020-11-03 MED ORDER — TRAZODONE HCL 100 MG PO TABS
100.0000 mg | ORAL_TABLET | Freq: Every day | ORAL | Status: DC
  Administered 2020-11-03 – 2020-11-08 (×6): 100 mg via ORAL
  Filled 2020-11-03 (×4): qty 2
  Filled 2020-11-03 (×2): qty 1

## 2020-11-03 MED ORDER — SODIUM CHLORIDE 0.9 % IV SOLN
1.0000 g | Freq: Once | INTRAVENOUS | Status: AC
  Administered 2020-11-03: 1 g via INTRAVENOUS
  Filled 2020-11-03: qty 10

## 2020-11-03 MED ORDER — ACETAMINOPHEN 650 MG RE SUPP: 650.0000 mg | Freq: Four times a day (QID) | RECTAL | Status: DC | PRN

## 2020-11-03 MED ORDER — ATORVASTATIN CALCIUM 40 MG PO TABS
40.0000 mg | ORAL_TABLET | Freq: Every day | ORAL | Status: DC
  Administered 2020-11-04 – 2020-11-09 (×5): 40 mg via ORAL
  Filled 2020-11-03 (×5): qty 1

## 2020-11-03 NOTE — ED Notes (Signed)
Bladder scan 178ml

## 2020-11-03 NOTE — ED Triage Notes (Signed)
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  States she is having lower abd pain, nausea, and decreased urination. Unable to get appt with Dr. [facility] until 3/11.  [facility] told her to come to ED.

## 2020-11-03 NOTE — ED Provider Notes (Signed)
[facility] EMERGENCY DEPARTMENT Provider Note   CSN: [id number] Arrival date & time: 11/03/20  1631     History Chief Complaint  Patient presents with  . Abdominal Pain    Jacqueline Leach is a 65 y.o. female.  The history is provided by the patient and medical records.   Jacqueline Leach is a 65 y.o. female who presents to the Emergency Department complaining of decreased urination. She states that she has been unwell for the last month. She has been experiencing progressive weight loss. On January 27 she went to [facility] due to constipation. She had a CT scan performed at that time and an enema performed as well. She was discharged home and return to [facility] on February 17 and had an additional CT scan that showed she had a bladder tumor. She was instructed to follow-up with urology but has been unable to establish an outpatient follow-up. She states that for the last two months she had been experiencing urinary frequency but today she has significant decrease in her urinary output. She also reports associated lower abdominal discomfort for the last two weeks. She has significant nausea with occasional vomiting, last episode this morning. No fevers but she does have chills. Last bowel movement was 2 to 3 weeks ago. No new lower extremity numbness and weakness. She does have a history of prior lower extremity weakness secondary to stroke.   Has a hx/o DM, HTN.    Does not smoke, drink alcohol.    Has been vaccinated for COVID 19.  Has not been boosted.   Lives with her two daughters.    PCP is [facility]. Records reviewed in epic. 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 Moderate bilateral Hydroureteronephrosis. Large rectal stool  ball with mild adjacent rectoanal bowel wall thickening.   CT your grandma obtained on February 17 with findings concerning for bladder neoplasm with persistent bilateral hydronephrosis, which is stable. Rectum is distended with stool and mild soft tissue thickening in this area, question degree of sterocoral proctitis, mildly enlarged retroperitoneal and left external iliac chain nodes.    Past Medical History:  Diagnosis Date  . Essential hypertension 08/12/2019  . Hyperlipidemia 08/12/2019  . Stroke ([place])   . Type II diabetes mellitus, uncontrolled ([place]) 08/12/2019    Patient Active Problem List   Diagnosis Date Noted  . AKI (acute kidney injury) ([place]) 11/03/2020  . Hypokalemia 11/03/2020  . Normocytic anemia 11/03/2020  . Hypoalbuminemia 11/03/2020  . Mild protein malnutrition ([place]) 11/03/2020  . Fall at home, initial encounter 08/12/2019  . Hematoma of right thigh 08/12/2019  . Hematemesis 08/12/2019  . Essential hypertension 08/12/2019  . Hyperlipidemia 08/12/2019  . Type II diabetes mellitus, uncontrolled ([place]) 08/12/2019  . Obesity 08/12/2019  . Noncompliance with medication regimen 08/12/2019  . 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 Ischemic stroke ([place]) - L pontine d/t small vessel dz s/p IV tPA 08/09/2019    History reviewed. No pertinent surgical history.   OB History   No obstetric history on file.     History reviewed. No pertinent family history.  Social History   Tobacco Use  . Smoking status: Never Smoker  . Smokeless tobacco: Never Used  Substance Use Topics  . Alcohol use: Not Currently  . Drug use: Never  Home Medications Prior to Admission medications   Medication Sig Start Date End Date Taking? Authorizing Provider  acetaminophen (TYLENOL) 325 MG tablet Take 2 tablets (650 mg total) by mouth every 4 (four) hours as needed for mild pain (or temp > 37.5 C (99.5 F)). 08/28/19   Angiulli, Lavon Paganini, PA-C  amLODipine  (NORVASC) 5 MG tablet Take 5 mg by mouth daily. 10/20/20   [provider]  aspirin 81 MG chewable tablet Chew 1 tablet (81 mg total) by mouth daily. 08/13/19   Donzetta Starch, NP  atorvastatin (LIPITOR) 40 MG tablet Take 40 mg by mouth daily. 08/09/20   [provider]  blood glucose meter kit and supplies Dispense based on patient and insurance preference. Use up to four times daily as directed. (FOR ICD-10 E10.9, E11.9). 08/29/19   Angiulli, Lavon Paganini, PA-C  clopidogrel (PLAVIX) 75 MG tablet Take 1 tablet (75 mg total) by mouth daily. 08/28/19   Angiulli, Lavon Paganini, PA-C  glipiZIDE (GLUCOTROL XL) 5 MG 24 hr tablet Take 5 mg by mouth daily. 10/16/20   [provider]  Insulin Glargine (LANTUS) 100 UNIT/ML Solostar Pen Inject 30 Units into the skin daily. 08/28/19   Angiulli, Lavon Paganini, PA-C  insulin lispro (HUMALOG) 100 UNIT/ML injection Inject 0.05 mLs (5 Units total) into the skin 3 (three) times daily before meals. Use only while patient is taking prednisone 09/03/19   Charlett Blake, MD  lisinopril (ZESTRIL) 20 MG tablet Take 20 mg by mouth daily. 10/16/20   [provider]  meloxicam (MOBIC) 7.5 MG tablet Take 7.5 mg by mouth daily. 10/14/20   [provider]  pantoprazole (PROTONIX) 40 MG tablet Take 1 tablet (40 mg total) by mouth daily. 08/28/19   Angiulli, Lavon Paganini, PA-C  senna-docusate (SENOKOT-S) 8.6-50 MG tablet Take 1 tablet by mouth at bedtime. 08/28/19   Angiulli, Lavon Paganini, PA-C  traZODone (DESYREL) 100 MG tablet Take 100 mg by mouth at bedtime. 08/09/20   [provider]    Allergies    Penicillins and Sulfa antibiotics  Review of Systems   Review of Systems  All other systems reviewed and are negative.   Physical Exam Updated Vital Signs BP (!) 173/95   Pulse 85   Temp 98.5 F (36.9 C) (Oral)   Resp 18   SpO2 100%   Physical Exam Vitals and nursing note reviewed.  Constitutional:      Appearance: She is  well-developed and well-nourished.  HENT:     Head: Normocephalic and atraumatic.  Cardiovascular:     Rate and Rhythm: Normal rate and regular rhythm.     Heart sounds: No murmur heard.   Pulmonary:     Effort: Pulmonary effort is normal. No respiratory distress.     Breath sounds: Normal breath sounds.  Abdominal:     Palpations: Abdomen is soft.     Tenderness: There is no guarding or rebound.     Comments: Mild suprapubic tenderness  Musculoskeletal:        General: No tenderness or edema.  Skin:    General: Skin is warm and dry.  Neurological:     Mental Status: She is alert and oriented to person, place, and time.  Psychiatric:        Mood and Affect: Mood and affect normal.        Behavior: Behavior normal.     ED Results / Procedures / Treatments   Labs (all labs ordered are listed, but only  abnormal results are displayed) Labs Reviewed  COMPREHENSIVE METABOLIC PANEL - Abnormal; Notable for the following components:      Result Value   Potassium 2.4 (*)    Glucose, Bld 172 (*)    BUN 24 (*)    Creatinine, Ser 1.95 (*)    Albumin 3.4 (*)    GFR, Estimated 28 (*)    All other components within normal limits  CBC - Abnormal; Notable for the following components:   RBC 3.78 (*)    Hemoglobin 11.1 (*)    HCT 33.2 (*)    All other components within normal limits  URINALYSIS, ROUTINE W REFLEX MICROSCOPIC - Abnormal; Notable for the following components:   Color, Urine STRAW (*)    Hgb urine dipstick MODERATE (*)    Bacteria, UA MANY (*)    All other components within normal limits  CBG MONITORING, ED - Abnormal; Notable for the following components:   Glucose-Capillary 157 (*)    All other components within normal limits  URINE CULTURE  SARS CORONAVIRUS 2 (TAT 6-24 HRS)  LIPASE, BLOOD  PHOSPHORUS  MAGNESIUM  HIV ANTIBODY (ROUTINE TESTING W REFLEX)  COMPREHENSIVE METABOLIC PANEL  CBC    EKG EKG Interpretation  Date/Time:  Tuesday November 03 2020  18:45:50 EST Ventricular Rate:  91 PR Interval:  132 QRS Duration: 76 QT Interval:  358 QTC Calculation: 440 R Axis:   66 Text Interpretation: Normal sinus rhythm Nonspecific T wave abnormality Abnormal ECG Confirmed by Quintella Reichert [id number]) on 11/03/2020 6:54:49 PM   Radiology No results found.  Procedures Procedures   Medications Ordered in ED Medications  0.9 % NaCl with KCl 40 mEq / L  infusion (has no administration in time range)  acetaminophen (TYLENOL) tablet 650 mg (has no administration in time range)    Or  acetaminophen (TYLENOL) suppository 650 mg (has no administration in time range)  prochlorperazine (COMPAZINE) injection 5 mg (has no administration in time range)  potassium chloride SA (KLOR-CON) CR tablet 20 mEq (has no administration in time range)  cefTRIAXone (ROCEPHIN) 1 g in sodium chloride 0.9 % 100 mL IVPB (has no administration in time range)  cefTRIAXone (ROCEPHIN) 1 g in sodium chloride 0.9 % 100 mL IVPB (1 g Intravenous New Bag/Given 11/03/20 1950)    ED Course  I have reviewed the triage vital signs and the nursing notes.  Pertinent labs & imaging results that were available during my care of the patient were reviewed by me and considered in my medical decision making (see chart for details).    MDM Rules/Calculators/A&P                         patient presents the emergency department for decreased urinary output, recently had a CT scan in another facility concerning for bladder cancer with bilateral Hydro. Labs today with significant hypokalemia and AK I. Urinalysis is concerning for UTI. She was started on potassium replacement as well as IV fluid hydration and antibiotics for possible UTI. Given her symptoms and findings medicine consulted for admission for ongoing treatment. Patient is in agreement with treatment plan. Final Clinical Impression(s) / ED Diagnoses Final diagnoses:  None    Rx / DC Orders ED Discharge Orders    None        Quintella Reichert, MD 11/03/20 2024

## 2020-11-03 NOTE — ED Notes (Signed)
Dr. Ralene Bathe notified of K 2.4.  Orders received.

## 2020-11-03 NOTE — H&P (Signed)
History and Physical    Jacqueline Leach XHB:[id number] DOB: May 05, 1956 DOA: 11/03/2020  [facility]P: [facility]  Patient coming from: Home.  I have personally briefly reviewed patient's old medical records in [place]  Chief Complaint: Abdominal pain and decreased urination.  HPI: Jacqueline Leach is a 65 y.o. female with medical history significant of essential hypertension, hyperlipidemia, history of other nonhemorrhagic CVA, type 2 diabetes who is coming to the emergency department due to abdominal pain associated with decreased urination, fatigue, malaise, decreased appetite, nausea with occasional vomiting since last month.  She has been constipated for the last 2 weeks.  Denies melena or hematochezia.  Also has urinary frequency with occasional dysuria and tenesmus.  She was seen at [facility] on 10/29/2020 where she had a CT scan that showed a bladder tumor and constipation.  She is a scheduled to be seen by urology on 11/20/2020, but the patient states that her symptoms were worsening so she decided to come to the emergency department.  She denies fevers, but complains of chills and left-sided frontal headaches.  Denies sore throat, rhinorrhea, wheezing, hemoptysis, dyspnea, chest pain, palpitations, diaphoresis,, orthopnea, PND or pitting edema of the lower extremities.  No polyuria, polydipsia, polyphagia or blurred vision.  ED Course: Initial vital signs were temperature 98.5 F, pulse 86, respiration 18, BP 193/94 mmHg O2 sat 100% on room air.  The patient received IV fluids and ceftriaxone in the emergency department.  I added metoprolol 5 mg IVP, magnesium sulfate 1 g IVPB and potassium replacement.  Labwork: Urinalysis show moderate hemoglobinuria with many bacteria on microscopic examination.  CBC showed a white count of 9.5, hemoglobin 11.1 g/dL platelets 210.  CMP shows potassium 2.4 mmol/L, all other electrolytes are normal.  Glucose 172, BUN 24 and  creatinine 1.95 mg/dL.  LFTs are normal, except for an albumin of 3.4 g/dL.  Lipase was 31.  Review of Systems: As per HPI otherwise all other systems reviewed and are negative.  Past Medical History:  Diagnosis Date  . Essential hypertension 08/12/2019  . Hyperlipidemia 08/12/2019  . Stroke ([place])   . Type II diabetes mellitus, uncontrolled ([place]) 08/12/2019    History reviewed. No pertinent surgical history.  Social History  reports that she has never smoked. She has never used smokeless tobacco. She reports previous alcohol use. She reports that she does not use drugs.  Allergies  Allergen Reactions  . Penicillins Rash    Did it involve swelling of the face/tongue/throat, SOB, or low BP? Yes Did it involve sudden or severe rash/hives, skin peeling, or any reaction on the inside of your mouth or nose? Yes Did you need to seek medical attention at a hospital or doctor's office? Yes When did it last happen?approx 65yo If all above answers are "NO", may proceed with cephalosporin use.   . Sulfa Antibiotics Rash   Family medical history Several family members with hypertension.  Prior to Admission medications   Medication Sig Start Date End Date Taking? Authorizing Provider  acetaminophen (TYLENOL) 325 MG tablet Take 2 tablets (650 mg total) by mouth every 4 (four) hours as needed for mild pain (or temp > 37.5 C (99.5 F)). 08/28/19  Yes Angiulli, Lavon Paganini, PA-C  amLODipine (NORVASC) 5 MG tablet Take 5 mg by mouth daily. 10/20/20  Yes [provider]  atorvastatin (LIPITOR) 40 MG tablet Take 40 mg by mouth at bedtime. 08/09/20  Yes [provider]  insulin NPH Human (NOVOLIN N) 100 UNIT/ML  injection Inject 10 Units into the skin as needed (If higher than 200 mg).   Yes [provider]  lisinopril (ZESTRIL) 20 MG tablet Take 20 mg by mouth daily. 10/16/20  Yes [provider]  senna-docusate (SENOKOT-S) 8.6-50 MG tablet Take 1 tablet by mouth at  bedtime. Patient taking differently: Take 1 tablet by mouth at bedtime as needed for mild constipation. 08/28/19  Yes Angiulli, Lavon Paganini, PA-C  traZODone (DESYREL) 100 MG tablet Take 100 mg by mouth at bedtime. 08/09/20  Yes [provider]  pantoprazole (PROTONIX) 40 MG tablet Take 1 tablet (40 mg total) by mouth daily. Patient not taking: No sig reported 08/28/19   Angiulli, Lavon Paganini, PA-C  potassium chloride (MICRO-K) 10 MEQ CR capsule Take 10 mEq by mouth See admin instructions. Bid x 5 days Patient not taking: No sig reported 10/26/20   [provider]    Physical Exam: Vitals:   11/03/20 1649 11/03/20 1915 11/03/20 2151  BP: (!) 193/94 (!) 173/95 (!) 195/78  Pulse: 86 85 87  Resp: 18 18 17   Temp: 98.5 F (36.9 C)    TempSrc: Oral    SpO2: 100% 100% 99%    Constitutional: Looks chronically ill, but currently in NAD. Eyes: PERRL, lids and conjunctivae normal ENMT: Mucous membranes are mildly dry.. Posterior pharynx clear of any exudate or lesions.  Neck: normal, supple, no masses, no thyromegaly Respiratory: clear to auscultation bilaterally, no wheezing, no crackles. Normal respiratory effort. No accessory muscle use.  Cardiovascular: Regular rate and rhythm, no murmurs / rubs / gallops. No extremity edema. 2+ pedal pulses. No carotid bruits.  Abdomen: No distention.  Bowel sounds positive.  Positive right CVA on percussion and mild RLQ tenderness without guarding or rebound, no masses palpated. No hepatosplenomegaly. Musculoskeletal: Mild generalized weakness.  No clubbing / cyanosis.  Good ROM, no contractures. Normal muscle tone.  Skin: no rashes, lesions, ulcers on very limited dermatological examination. Neurologic: CN 2-12 grossly intact. Sensation intact, DTR normal. Strength 5/5 in all 4.  Psychiatric: Normal judgment and insight. Alert and oriented x 3. Normal mood.   Labs on Admission: I have personally reviewed following labs and imaging  studies  CBC: Recent Labs  Lab 11/03/20 1713  WBC 9.5  HGB 11.1*  HCT 33.2*  MCV 87.8  PLT 161    Basic Metabolic Panel: Recent Labs  Lab 11/03/20 1713 11/03/20 2130  NA 140  --   K 2.4*  --   CL 104  --   CO2 26  --   GLUCOSE 172*  --   BUN 24*  --   CREATININE 1.95*  --   CALCIUM 9.2  --   MG  --  1.7  PHOS  --  4.3    GFR: CrCl cannot be calculated (Unknown ideal weight.).  Liver Function Tests: Recent Labs  Lab 11/03/20 1713  AST 16  ALT 15  ALKPHOS 73  BILITOT 0.3  PROT 6.9  ALBUMIN 3.4*    Urine analysis:    Component Value Date/Time   COLORURINE STRAW (A) 11/03/2020 1707   APPEARANCEUR CLEAR 11/03/2020 1707   LABSPEC 1.009 11/03/2020 1707   PHURINE 6.0 11/03/2020 1707   GLUCOSEU NEGATIVE 11/03/2020 1707   HGBUR MODERATE (A) 11/03/2020 1707   BILIRUBINUR NEGATIVE 11/03/2020 1707   KETONESUR NEGATIVE 11/03/2020 1707   PROTEINUR NEGATIVE 11/03/2020 1707   NITRITE NEGATIVE 11/03/2020 1707   LEUKOCYTESUR NEGATIVE 11/03/2020 1707    Radiological Exams on Admission: No results  found.  EKG: Independently reviewed.  Vent. rate 91 BPM PR interval 132 ms QRS duration 76 ms QT/QTc 358/440 ms P-R-T axes 57 66 42 Normal sinus rhythm Nonspecific T wave abnormality Abnormal ECG  Assessment/Plan Principal Problem:   AKI (acute kidney injury) ([place]) In the setting of bladder tumor/hydronephrosis. Observation/telemetry. Continue IV fluids. Monitor intake and output. Hold lisinopril. Avoid hypotension. Avoid nephrotoxic medications. Consult urology in the morning.  Active Problems:   Hypokalemia Replacing. Magnesium was supplemented. Follow potassium level.    Essential hypertension Hold lisinopril. Continue amlodipine 10 mg p.o. daily.    Hyperlipidemia Continue atorvastatin 40 mg p.o. daily.    Type II diabetes mellitus, uncontrolled (H[facility]) Carbohydrate modified diet. CBG monitoring with R ISS. Check hemoglobin A1c.     Normocytic anemia Monitor hematocrit and hemoglobin. Transfuse as needed.    Hypoalbuminemia   Mild protein malnutrition ([facility]) Mild generalized weakness. No muscle wasting noticed. Increase protein intake. Consult nutritional services.   DVT prophylaxis:    SCDs. Code Status:   Full code. Family Communication: Disposition Plan:   Patient is from:  Home.  Anticipated DC to:             Home.  Anticipated DC date:  11/05/2020.  Anticipated DC barriers: Clinical status/urology evaluation.  Consults called:   Admission status:  Observation/telemetry.  Severity of Illness:  High due to AKI with electrolyte abnormality associated with hydronephrosis and hypertension.  The patient will need to remain in the hospital for further treatment and evaluation as she may deteriorate without any intervention.  Reubin Milan MD [facility]  How to contact the [facility] Attending or Consulting provider [place] or covering provider during after hours [place], for this patient?   1. Check the care team in [facility] and look for a) attending/consulting [facility] provider listed and b) the [facility] team listed 2. Log into [email or web address] and use [facility] universal password to access. If you do not have the password, please contact the hospital operator. 3. Locate the [facility] provider you are looking for under [facility] and page to a number that you can be directly reached. 4. If you still have difficulty reaching the provider, please page the [facility] (Director on Call) for the Hospitalists listed on amion for assistance.  11/03/2020, 11:06 PM   This document was prepared in [employer] may contain some unintended transcription errors.

## 2020-11-04 ENCOUNTER — Encounter (HOSPITAL_COMMUNITY): Payer: Self-pay | Admitting: Internal Medicine

## 2020-11-04 DIAGNOSIS — K5900 Constipation, unspecified: Secondary | ICD-10-CM | POA: Diagnosis present

## 2020-11-04 DIAGNOSIS — E1165 Type 2 diabetes mellitus with hyperglycemia: Secondary | ICD-10-CM | POA: Diagnosis present

## 2020-11-04 DIAGNOSIS — Z87891 Personal history of nicotine dependence: Secondary | ICD-10-CM | POA: Diagnosis not present

## 2020-11-04 DIAGNOSIS — I10 Essential (primary) hypertension: Secondary | ICD-10-CM | POA: Diagnosis present

## 2020-11-04 DIAGNOSIS — Z882 Allergy status to sulfonamides status: Secondary | ICD-10-CM | POA: Diagnosis not present

## 2020-11-04 DIAGNOSIS — Z8249 Family history of ischemic heart disease and other diseases of the circulatory system: Secondary | ICD-10-CM | POA: Diagnosis not present

## 2020-11-04 DIAGNOSIS — E876 Hypokalemia: Secondary | ICD-10-CM | POA: Diagnosis present

## 2020-11-04 DIAGNOSIS — E8809 Other disorders of plasma-protein metabolism, not elsewhere classified: Secondary | ICD-10-CM | POA: Diagnosis present

## 2020-11-04 DIAGNOSIS — N179 Acute kidney failure, unspecified: Secondary | ICD-10-CM | POA: Diagnosis present

## 2020-11-04 DIAGNOSIS — N1330 Unspecified hydronephrosis: Secondary | ICD-10-CM | POA: Diagnosis present

## 2020-11-04 DIAGNOSIS — N3289 Other specified disorders of bladder: Secondary | ICD-10-CM | POA: Diagnosis not present

## 2020-11-04 DIAGNOSIS — Z6823 Body mass index (BMI) 23.0-23.9, adult: Secondary | ICD-10-CM | POA: Diagnosis not present

## 2020-11-04 DIAGNOSIS — E785 Hyperlipidemia, unspecified: Secondary | ICD-10-CM | POA: Diagnosis present

## 2020-11-04 DIAGNOSIS — Z794 Long term (current) use of insulin: Secondary | ICD-10-CM | POA: Diagnosis not present

## 2020-11-04 DIAGNOSIS — Z79899 Other long term (current) drug therapy: Secondary | ICD-10-CM | POA: Diagnosis not present

## 2020-11-04 DIAGNOSIS — Z8673 Personal history of transient ischemic attack (TIA), and cerebral infarction without residual deficits: Secondary | ICD-10-CM | POA: Diagnosis not present

## 2020-11-04 DIAGNOSIS — Z880 Allergy status to penicillin: Secondary | ICD-10-CM | POA: Diagnosis not present

## 2020-11-04 DIAGNOSIS — R32 Unspecified urinary incontinence: Secondary | ICD-10-CM | POA: Diagnosis present

## 2020-11-04 DIAGNOSIS — Z20822 Contact with and (suspected) exposure to covid-19: Secondary | ICD-10-CM | POA: Diagnosis present

## 2020-11-04 DIAGNOSIS — E441 Mild protein-calorie malnutrition: Secondary | ICD-10-CM | POA: Diagnosis present

## 2020-11-04 DIAGNOSIS — D649 Anemia, unspecified: Secondary | ICD-10-CM | POA: Diagnosis present

## 2020-11-04 DIAGNOSIS — D494 Neoplasm of unspecified behavior of bladder: Secondary | ICD-10-CM | POA: Diagnosis present

## 2020-11-04 LAB — CBC
HCT: 27.4 % — ABNORMAL LOW (ref 36.0–46.0)
Hemoglobin: 9.6 g/dL — ABNORMAL LOW (ref 12.0–15.0)
MCH: 30.5 pg (ref 26.0–34.0)
MCHC: 35 g/dL (ref 30.0–36.0)
MCV: 87 fL (ref 80.0–100.0)
Platelets: 185 10*3/uL (ref 150–400)
RBC: 3.15 MIL/uL — ABNORMAL LOW (ref 3.87–5.11)
RDW: 13.3 % (ref 11.5–15.5)
WBC: 9.8 10*3/uL (ref 4.0–10.5)
nRBC: 0 % (ref 0.0–0.2)

## 2020-11-04 LAB — COMPREHENSIVE METABOLIC PANEL
ALT: 12 U/L (ref 0–44)
AST: 13 U/L — ABNORMAL LOW (ref 15–41)
Albumin: 2.9 g/dL — ABNORMAL LOW (ref 3.5–5.0)
Alkaline Phosphatase: 62 U/L (ref 38–126)
Anion gap: 12 (ref 5–15)
BUN: 22 mg/dL (ref 8–23)
CO2: 23 mmol/L (ref 22–32)
Calcium: 8.6 mg/dL — ABNORMAL LOW (ref 8.9–10.3)
Chloride: 107 mmol/L (ref 98–111)
Creatinine, Ser: 1.85 mg/dL — ABNORMAL HIGH (ref 0.44–1.00)
GFR, Estimated: 30 mL/min — ABNORMAL LOW (ref >60)
Glucose, Bld: 129 mg/dL — ABNORMAL HIGH (ref 70–99)
Potassium: 2.7 mmol/L — CL (ref 3.5–5.1)
Sodium: 142 mmol/L (ref 135–145)
Total Bilirubin: 0.3 mg/dL (ref 0.3–1.2)
Total Protein: 5.9 g/dL — ABNORMAL LOW (ref 6.5–8.1)

## 2020-11-04 LAB — CBG MONITORING, ED
Glucose-Capillary: 187 mg/dL — ABNORMAL HIGH (ref 70–99)
Glucose-Capillary: 98 mg/dL (ref 70–99)

## 2020-11-04 LAB — HIV ANTIBODY (ROUTINE TESTING W REFLEX): HIV Screen 4th Generation wRfx: NONREACTIVE

## 2020-11-04 LAB — GLUCOSE, CAPILLARY
Glucose-Capillary: 207 mg/dL — ABNORMAL HIGH (ref 70–99)
Glucose-Capillary: 88 mg/dL (ref 70–99)

## 2020-11-04 LAB — SARS CORONAVIRUS 2 (TAT 6-24 HRS): SARS Coronavirus 2: NEGATIVE

## 2020-11-04 MED ORDER — POTASSIUM CHLORIDE CRYS ER 20 MEQ PO TBCR
40.0000 meq | EXTENDED_RELEASE_TABLET | Freq: Once | ORAL | Status: AC
  Administered 2020-11-04: 40 meq via ORAL
  Filled 2020-11-04: qty 2

## 2020-11-04 MED ORDER — POTASSIUM CHLORIDE CRYS ER 20 MEQ PO TBCR
20.0000 meq | EXTENDED_RELEASE_TABLET | Freq: Once | ORAL | Status: DC
  Filled 2020-11-04: qty 1

## 2020-11-04 MED ORDER — METOPROLOL TARTRATE 5 MG/5ML IV SOLN
5.0000 mg | Freq: Four times a day (QID) | INTRAVENOUS | Status: DC | PRN
  Administered 2020-11-04 – 2020-11-08 (×6): 5 mg via INTRAVENOUS
  Filled 2020-11-04 (×6): qty 5

## 2020-11-04 MED ORDER — POTASSIUM CHLORIDE 2 MEQ/ML IV SOLN
INTRAVENOUS | Status: DC
  Filled 2020-11-04: qty 1000

## 2020-11-04 MED ORDER — LACTATED RINGERS IV SOLN: INTRAVENOUS | Status: DC

## 2020-11-04 NOTE — ED Notes (Signed)
Tele Breakfast Order Placed

## 2020-11-04 NOTE — ED Notes (Signed)
Jacqueline Copas, MD sent request for antiemetic

## 2020-11-04 NOTE — Progress Notes (Signed)
PROGRESS NOTE    Jacqueline Leach  KVQ:[id number] DOB: 05-11-1956 DOA: 11/03/2020 [facility]CP: [facility]    Brief Narrative:  65 y.o. female with medical history significant of essential hypertension, hyperlipidemia, history of other nonhemorrhagic CVA, type 2 diabetes who is coming to the emergency department due to abdominal pain associated with decreased urination, fatigue, malaise, decreased appetite, nausea with occasional vomiting since last month.  She has been constipated for the last 2 weeks.  Denies melena or hematochezia.  Also has urinary frequency with occasional dysuria and tenesmus.  She was seen at [facility] on 10/29/2020 where she had a CT scan that showed a bladder tumor and constipation.  She is a scheduled to be seen by urology on 11/20/2020, but the patient states that her symptoms were worsening so she decided to come to the emergency department.  She denies fevers, but complains of chills and left-sided frontal headaches.  Denies sore throat, rhinorrhea, wheezing, hemoptysis, dyspnea, chest pain, palpitations, diaphoresis,, orthopnea, PND or pitting edema of the lower extremities.  No polyuria, polydipsia, polyphagia or blurred vision.  Assessment & Plan:   Principal Problem:   AKI (acute kidney injury) ([place]) Active Problems:   Essential hypertension   Hyperlipidemia   Type II diabetes mellitus, uncontrolled ([place])   Hypokalemia   Normocytic anemia   Hypoalbuminemia   Mild protein malnutrition (HCC)  Principal Problem:   AKI (acute kidney injury) ([place]) Likely in the setting of bladder tumor/hydronephrosis. Continue IV fluids. Hold lisinopril. Avoid hypotension. Avoid nephrotoxic medications. Cr has improved slightly to 1.85 today  Active Problems:   Hypokalemia replaced Magnesium was supplemented at time of presentation Recheck bmet in AM    Essential hypertension Holding lisinopril given ARF Continue amlodipine 10 mg p.o. daily.     Hyperlipidemia Continue atorvastatin 40 mg p.o. daily.    Type II diabetes mellitus, uncontrolled (HCC) Carbohydrate modified diet. Cont with SSI coverage as needed Hgb a1c noted to be 5.5    Normocytic anemia Monitor hematocrit and hemoglobin. Transfuse as needed. Repeat cbc in AM    Hypoalbuminemia   Mild protein malnutrition (HCC) Mild generalized weakness. No muscle wasting noticed. Increase protein intake. Have consulted dietitian  Nausea -Seen at bedside with emesis bag -Pt reports being unable to tolerate any significant PO intake this AM -Have downgraded to clear liquids for now -Continue with anti-emetic as tolerated -Cont with IVF hydration  DVT prophylaxis: SCD's Code Status: Full Family Communication: Pt in room, family not at bedside  Status is: Observation  The patient remains OBS appropriate and will d/c before 2 midnights.  Dispo: The patient is from: Home              Anticipated d/c is to: Home              Anticipated d/c date is: 2 days              Patient currently is not medically stable to d/c.   Difficult to place patient No       Consultants:     Procedures:     Antimicrobials: Anti-infectives (From admission, onward)   Start     Dose/Rate Route Frequency Ordered Stop   11/04/20 1930  cefTRIAXone (ROCEPHIN) 1 g in sodium chloride 0.9 % 100 mL IVPB        1 g 200 mL/hr over 30 Minutes Intravenous Every 24 hours 11/03/20 2020     11/03/20 1930  cefTRIAXone (ROCEPHIN) 1 g in sodium chloride  0.9 % 100 mL IVPB        1 g 200 mL/hr over 30 Minutes Intravenous  Once 11/03/20 1928 11/03/20 2029       Subjective: Complaining of nausea this AM, unable to tolerate any significant PO, but willing to try clears  Objective: Vitals:   11/04/20 1200 11/04/20 1215 11/04/20 1248 11/04/20 1300  BP: (!) 186/78  (!) 154/81   Pulse: 78  89   Resp: (!) 22  (!) 21 20  Temp:  98.4 F (36.9 C) 98.5 F (36.9 C)   TempSrc:  Oral Oral    SpO2: 100%  98%   Weight:   58.3 kg   Height:   5\' 2"  (1.575 m)     Intake/Output Summary (Last 24 hours) at 11/04/2020 1610 Last data filed at 11/04/2020 1124 Gross per 24 hour  Intake 1157.84 ml  Output --  Net 1157.84 ml   Filed Weights   11/04/20 1248  Weight: 58.3 kg    Examination:  General exam: Appears calm and comfortable  Respiratory system: Clear to auscultation. Respiratory effort normal. Cardiovascular system: S1 & S2 heard, Regular Gastrointestinal system: Abdomen is nondistended, soft and nontender. No organomegaly or masses felt. Normal bowel sounds heard. Central nervous system: Alert and oriented. No focal neurological deficits. Extremities: Symmetric 5 x 5 power. Skin: No rashes, lesions  Psychiatry: Judgement and insight appear normal. Mood & affect appropriate.   Data Reviewed: I have personally reviewed following labs and imaging studies  CBC: Recent Labs  Lab 11/03/20 1713 11/04/20 0242  WBC 9.5 9.8  HGB 11.1* 9.6*  HCT 33.2* 27.4*  MCV 87.8 87.0  PLT 210 962   Basic Metabolic Panel: Recent Labs  Lab 11/03/20 1713 11/03/20 1839 11/03/20 2130 11/04/20 0242  NA 140  --   --  142  K 2.4*  --   --  2.7*  CL 104  --   --  107  CO2 26  --   --  23  GLUCOSE 172*  --   --  129*  BUN 24*  --   --  22  CREATININE 1.95*  --   --  1.85*  CALCIUM 9.2  --   --  8.6*  MG  --   --  1.7  --   PHOS  --  4.1 4.3  --    GFR: Estimated Creatinine Clearance: 24 mL/min (A) (by C-G formula based on SCr of 1.85 mg/dL (H)). Liver Function Tests: Recent Labs  Lab 11/03/20 1713 11/04/20 0242  AST 16 13*  ALT 15 12  ALKPHOS 73 62  BILITOT 0.3 0.3  PROT 6.9 5.9*  ALBUMIN 3.4* 2.9*   Recent Labs  Lab 11/03/20 1713  LIPASE 31   No results for input(s): AMMONIA in the last 168 hours. Coagulation Profile: No results for input(s): INR, PROTIME in the last 168 hours. Cardiac Enzymes: No results for input(s): CKTOTAL, CKMB, CKMBINDEX, TROPONINI in  the last 168 hours. BNP (last 3 results) No results for input(s): PROBNP in the last 8760 hours. HbA1C: Recent Labs    11/03/20 2047  HGBA1C 5.5   CBG: Recent Labs  Lab 11/03/20 1944 11/03/20 2210 11/04/20 0739 11/04/20 1214  GLUCAP 157* 110* 187* 98   Lipid Profile: No results for input(s): CHOL, HDL, LDLCALC, TRIG, CHOLHDL, LDLDIRECT in the last 72 hours. Thyroid Function Tests: No results for input(s): TSH, T4TOTAL, FREET4, T3FREE, THYROIDAB in the last 72 hours. Anemia Panel: No results for input(s):  VITAMINB12, FOLATE, FERRITIN, TIBC, IRON, RETICCTPCT in the last 72 hours. Sepsis Labs: No results for input(s): PROCALCITON, LATICACIDVEN in the last 168 hours.  Recent Results (from the past 240 hour(s))  SARS CORONAVIRUS 2 (TAT 6-24 HRS) Nasopharyngeal Nasopharyngeal Swab     Status: None   Collection Time: 11/03/20  7:14 PM   Specimen: Nasopharyngeal Swab  Result Value Ref Range Status   SARS Coronavirus 2 NEGATIVE NEGATIVE Final    Comment: (NOTE) SARS-CoV-2 target nucleic acids are NOT DETECTED.  The SARS-CoV-2 RNA is generally detectable in upper and lower respiratory specimens during the acute phase of infection. Negative results do not preclude SARS-CoV-2 infection, do not rule out co-infections with other pathogens, and should not be used as the sole basis for treatment or other patient management decisions. Negative results must be combined with clinical observations, patient history, and epidemiological information. The expected result is Negative.  Fact Sheet for Patients: SugarRoll.be  Fact Sheet for Healthcare Providers: https://www.woods-mathews.com/  This test is not yet approved or cleared by the [place] FDA and  has been authorized for detection and/or diagnosis of SARS-CoV-2 by FDA under an Emergency Use Authorization (EUA). This EUA will remain  in effect (meaning this test can be used) for the  duration of the COVID-19 declaration under Se ction 564(b)(1) of the Act, 21 U.S.C. section 360bbb-3(b)(1), unless the authorization is terminated or revoked sooner.  Performed at [facility], [place] [address] [place], [place] [address]      Radiology Studies: No results found.  Scheduled Meds: . amLODipine  5 mg Oral Daily  . atorvastatin  40 mg Oral Daily  . insulin aspart  0-15 Units Subcutaneous TID WC  . pantoprazole  40 mg Oral Daily  . senna-docusate  1 tablet Oral QHS  . sorbitol, milk of mag, mineral oil, glycerin (SMOG) enema  960 mL Rectal Once  . traZODone  100 mg Oral QHS   Continuous Infusions: . cefTRIAXone (ROCEPHIN)  IV    . lactated ringers 100 mL/hr at 11/04/20 1124     LOS: 0 days   Marylu Lund, MD [facility] Pager On Amion  If 7PM-7AM, please contact night-coverage 11/04/2020, 4:10 PM

## 2020-11-04 NOTE — ED Notes (Signed)
Pt actively vomiting at this time.

## 2020-11-04 NOTE — Care Management Obs Status (Signed)
[place] NOTIFICATION   Patient Details  Name: Jacqueline Leach MRN: [id number] Date of Birth: 11/19/1955   Medicare Observation Status Notification Given:  Yes    Carles Collet, RN 11/04/2020, 4:59 PM

## 2020-11-05 ENCOUNTER — Inpatient Hospital Stay (HOSPITAL_COMMUNITY): Payer: Medicare HMO

## 2020-11-05 DIAGNOSIS — N179 Acute kidney failure, unspecified: Secondary | ICD-10-CM | POA: Diagnosis not present

## 2020-11-05 DIAGNOSIS — N3289 Other specified disorders of bladder: Secondary | ICD-10-CM | POA: Diagnosis not present

## 2020-11-05 DIAGNOSIS — I10 Essential (primary) hypertension: Secondary | ICD-10-CM | POA: Diagnosis not present

## 2020-11-05 LAB — COMPREHENSIVE METABOLIC PANEL
ALT: 10 U/L (ref 0–44)
AST: <5 U/L — ABNORMAL LOW (ref 15–41)
Albumin: 2.5 g/dL — ABNORMAL LOW (ref 3.5–5.0)
Alkaline Phosphatase: 60 U/L (ref 38–126)
Anion gap: 10 (ref 5–15)
BUN: 16 mg/dL (ref 8–23)
CO2: 22 mmol/L (ref 22–32)
Calcium: 8.4 mg/dL — ABNORMAL LOW (ref 8.9–10.3)
Chloride: 108 mmol/L (ref 98–111)
Creatinine, Ser: 1.65 mg/dL — ABNORMAL HIGH (ref 0.44–1.00)
GFR, Estimated: 34 mL/min — ABNORMAL LOW (ref >60)
Glucose, Bld: 114 mg/dL — ABNORMAL HIGH (ref 70–99)
Potassium: 3 mmol/L — ABNORMAL LOW (ref 3.5–5.1)
Sodium: 140 mmol/L (ref 135–145)
Total Bilirubin: 0.3 mg/dL (ref 0.3–1.2)
Total Protein: 5.1 g/dL — ABNORMAL LOW (ref 6.5–8.1)

## 2020-11-05 LAB — URINE CULTURE: Culture: <10000 — AB

## 2020-11-05 LAB — CBC
HCT: 25.1 % — ABNORMAL LOW (ref 36.0–46.0)
Hemoglobin: 8.9 g/dL — ABNORMAL LOW (ref 12.0–15.0)
MCH: 30.3 pg (ref 26.0–34.0)
MCHC: 35.5 g/dL (ref 30.0–36.0)
MCV: 85.4 fL (ref 80.0–100.0)
Platelets: 169 10*3/uL (ref 150–400)
RBC: 2.94 MIL/uL — ABNORMAL LOW (ref 3.87–5.11)
RDW: 13.3 % (ref 11.5–15.5)
WBC: 9.3 10*3/uL (ref 4.0–10.5)
nRBC: 0 % (ref 0.0–0.2)

## 2020-11-05 LAB — GLUCOSE, CAPILLARY
Glucose-Capillary: 124 mg/dL — ABNORMAL HIGH (ref 70–99)
Glucose-Capillary: 151 mg/dL — ABNORMAL HIGH (ref 70–99)
Glucose-Capillary: 133 mg/dL — ABNORMAL HIGH (ref 70–99)
Glucose-Capillary: 164 mg/dL — ABNORMAL HIGH (ref 70–99)

## 2020-11-05 LAB — MAGNESIUM: Magnesium: 1.7 mg/dL (ref 1.7–2.4)

## 2020-11-05 IMAGING — US US RENAL
1 series · 14 of 25 positions shown · non-contrast
Comparison: None.

CLINICAL DATA: Bladder mass.

EXAM:
RENAL / URINARY TRACT ULTRASOUND COMPLETE

[Series 1: us renal · 14 of 60 slices shown]
[im 1/60]
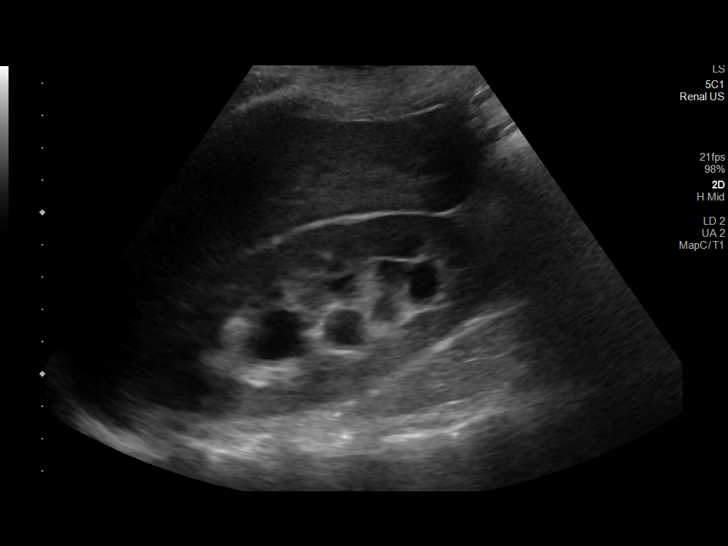
[im 5/60]
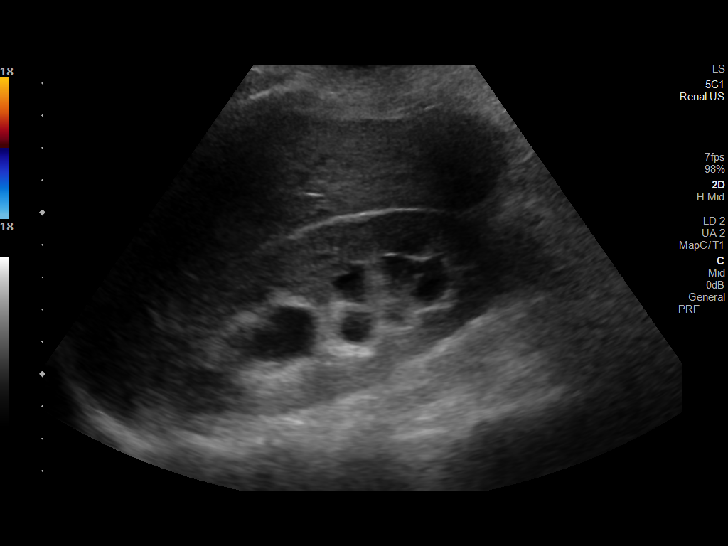
[im 10/60]
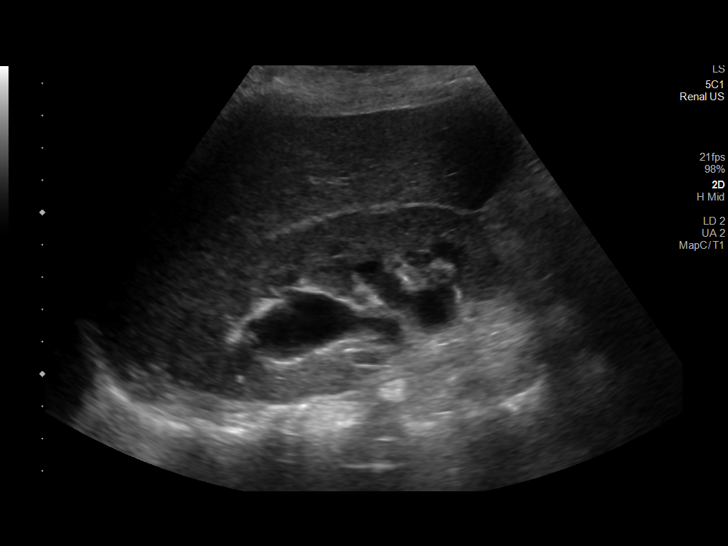
[im 15/60]
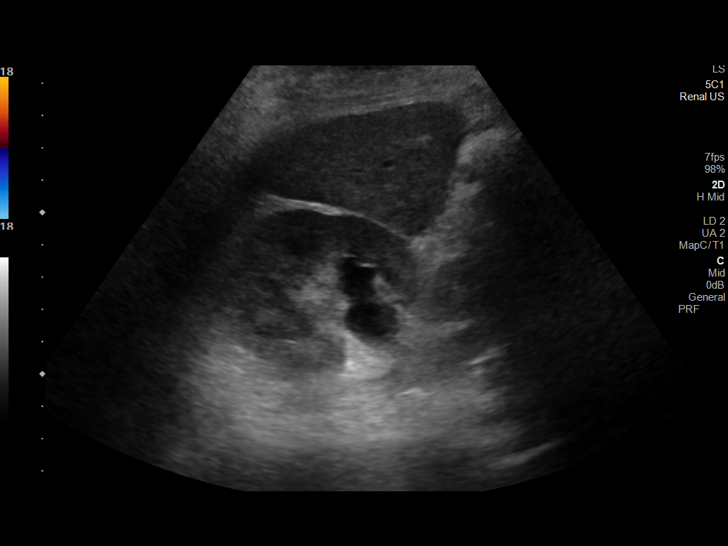
[im 20/60]
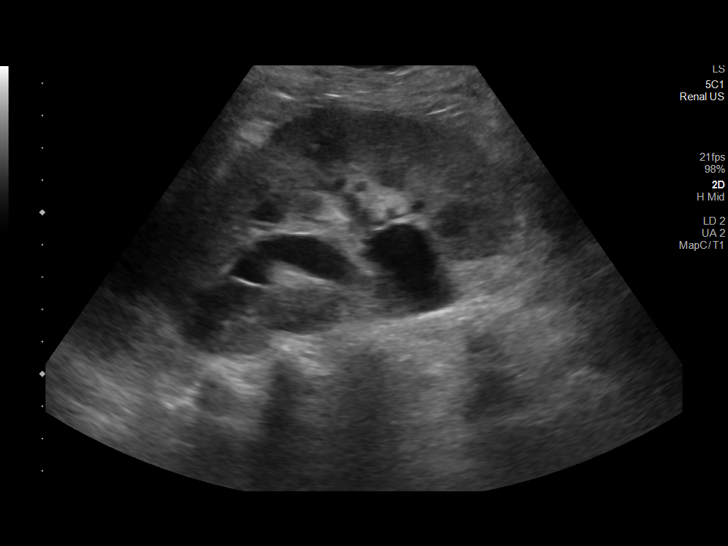
[im 23/60]
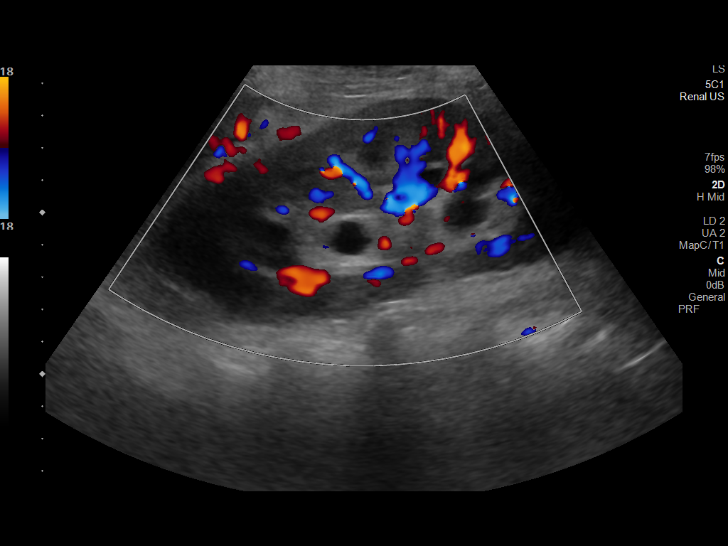
[im 28/60]
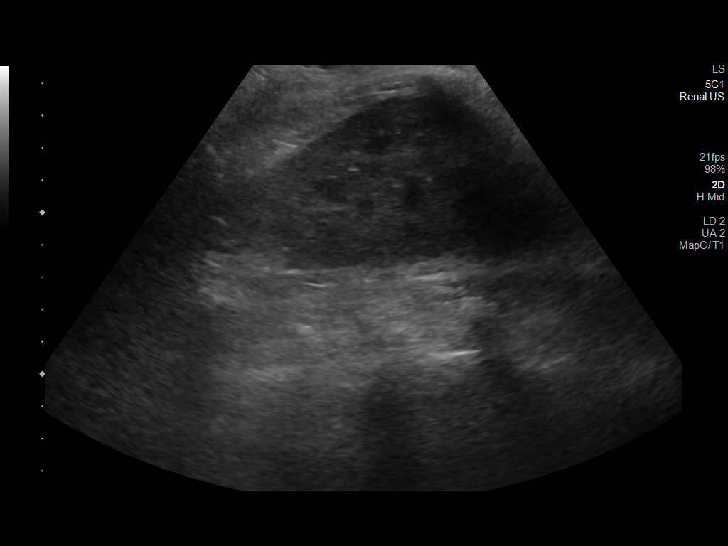
[im 32/60]
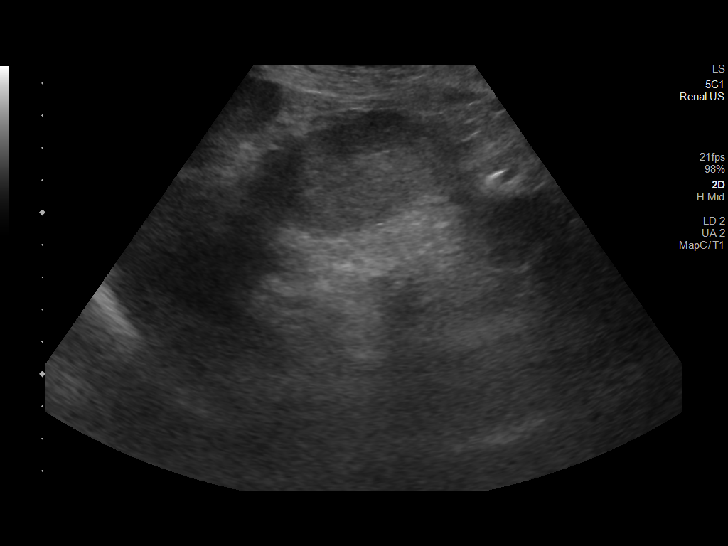
[im 37/60]
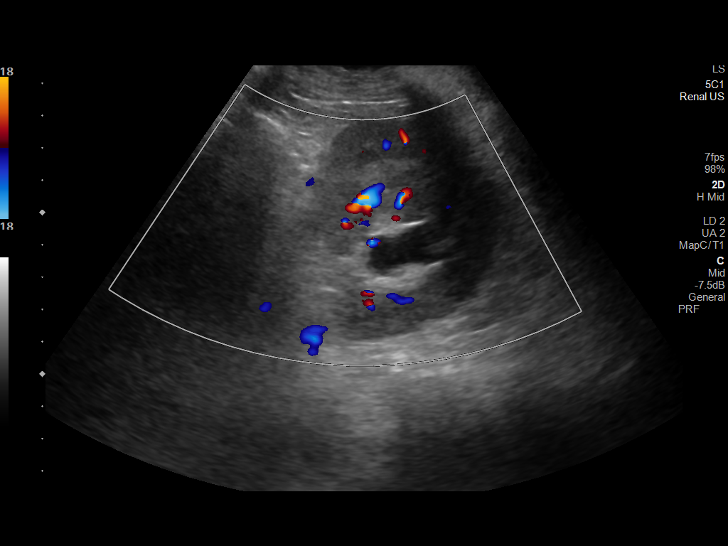
[im 40/60]
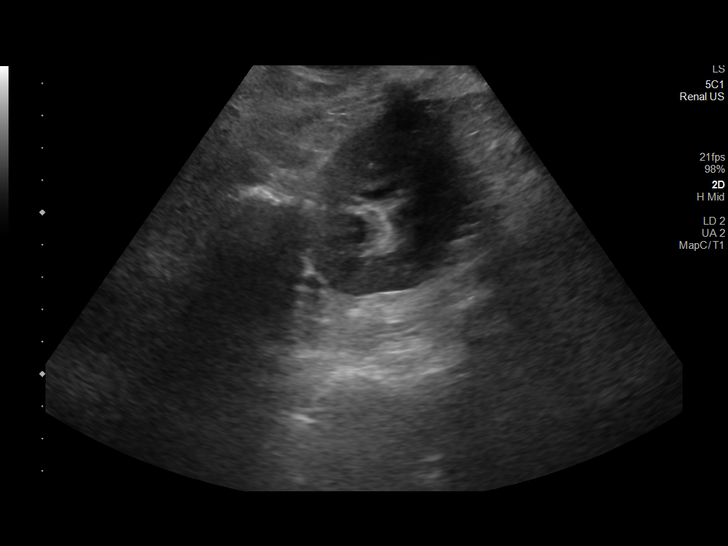
[im 45/60]
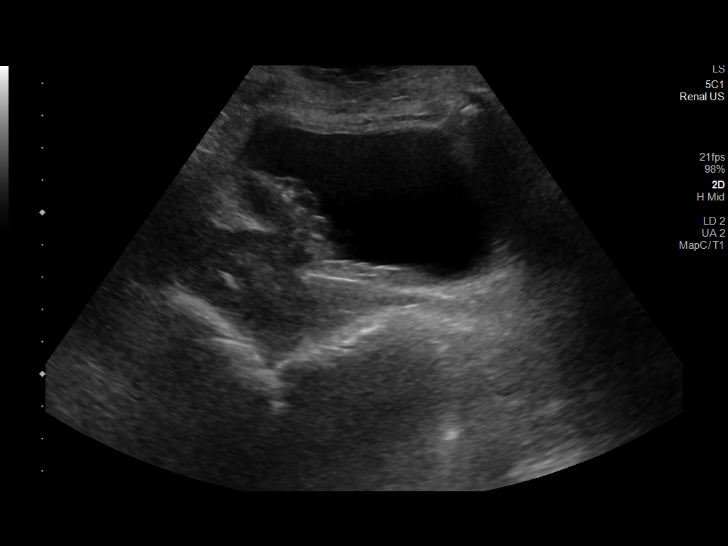
[im 50/60]
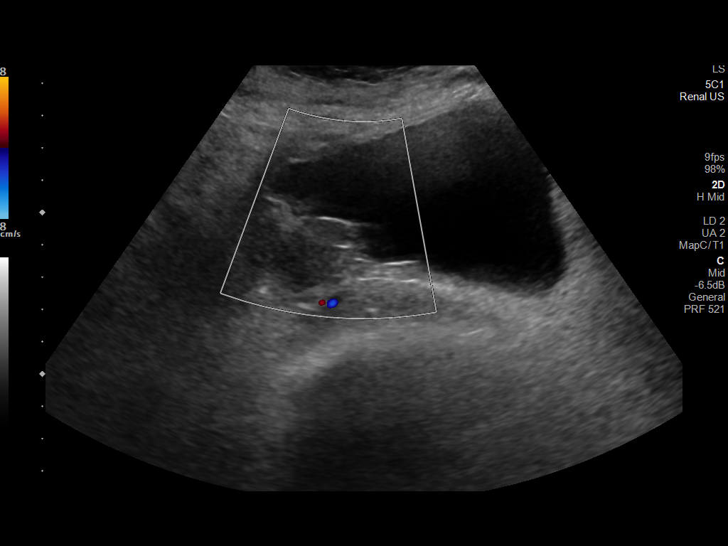
[im 55/60]
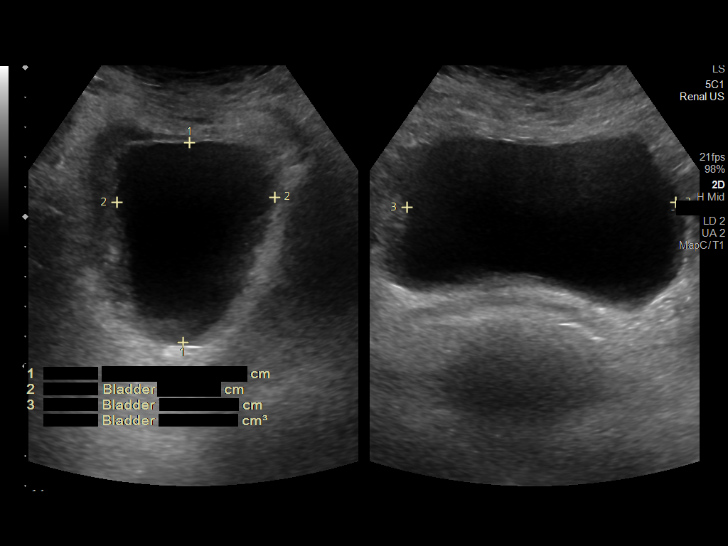
[im 60/60]
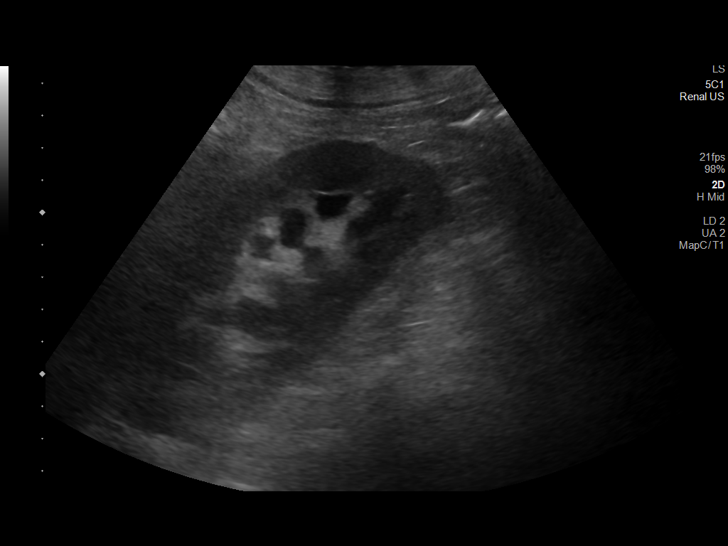

[14 of 25 positions shown; findings below may reference images not displayed]

FINDINGS: Right Kidney:

Renal measurements: 10.6 cm x 5.0 cm x 4.3 cm = volume: 118.78 mL.
Echogenicity within normal limits. No mass or is visualized. There
is moderate severity right-sided hydronephrosis.

Left Kidney:

Renal measurements: 13.0 cm x 5.9 cm x 4.3 cm = volume: 169.16 mL.
Echogenicity within normal limits. No mass is visualized. There is
moderate severity left-sided hydronephrosis.

Bladder:

A heterogeneous thickened right-sided urinary bladder wall is noted.

Other:

None.
IMPRESSION: 1. Moderate severity bilateral hydronephrosis.
2. Asymmetric heterogeneous urinary bladder wall thickening, as
described above. Sequelae associated with an underlying neoplastic
process cannot be excluded.

## 2020-11-05 MED ORDER — POTASSIUM CHLORIDE CRYS ER 20 MEQ PO TBCR
40.0000 meq | EXTENDED_RELEASE_TABLET | Freq: Two times a day (BID) | ORAL | Status: AC
  Administered 2020-11-05 (×2): 40 meq via ORAL
  Filled 2020-11-05 (×2): qty 2

## 2020-11-05 MED ORDER — ENSURE ENLIVE PO LIQD
237.0000 mL | Freq: Two times a day (BID) | ORAL | Status: DC
  Administered 2020-11-05 – 2020-11-07 (×3): 237 mL via ORAL

## 2020-11-05 MED ORDER — MAGNESIUM SULFATE 2 GM/50ML IV SOLN
2.0000 g | Freq: Once | INTRAVENOUS | Status: AC
  Administered 2020-11-05: 2 g via INTRAVENOUS
  Filled 2020-11-05: qty 50

## 2020-11-05 MED ORDER — POLYETHYLENE GLYCOL 3350 17 G PO PACK
17.0000 g | PACK | Freq: Every day | ORAL | Status: DC
  Administered 2020-11-05 – 2020-11-09 (×4): 17 g via ORAL
  Filled 2020-11-05 (×4): qty 1

## 2020-11-05 NOTE — Progress Notes (Signed)
Sent chat message to Dr. Earlie Counts: Patient states she ate a sausage biscuit brought in by pt's daughter this morning. She states she does not want to continue the clear liquid diet. Also, that she does not want to do an enema until she speaks w/ you personally. Pt denies any nausea/vomiting.

## 2020-11-05 NOTE — Progress Notes (Signed)
PROGRESS NOTE    Jacqueline Leach  PIR:[id number] DOB: [birth year] DOA: 11/03/2020 [facility]: [facility]    Brief Narrative:  65 y.o. female with medical history significant of essential hypertension, hyperlipidemia, history of other nonhemorrhagic CVA, type 2 diabetes who is coming to the emergency department due to abdominal pain associated with decreased urination, fatigue, malaise, decreased appetite, nausea with occasional vomiting since last month.  She has been constipated for the last 2 weeks.  Denies melena or hematochezia.  Also has urinary frequency with occasional dysuria and tenesmus.  She was seen at [facility] on 10/29/2020 where she had a CT scan that showed a bladder tumor and constipation.  She is a scheduled to be seen by urology on 11/20/2020, but the patient states that her symptoms were worsening so she decided to come to the emergency department.  She denies fevers, but complains of chills and left-sided frontal headaches.  Denies sore throat, rhinorrhea, wheezing, hemoptysis, dyspnea, chest pain, palpitations, diaphoresis,, orthopnea, PND or pitting edema of the lower extremities.  No polyuria, polydipsia, polyphagia or blurred vision.  Assessment & Plan:   Principal Problem:   AKI (acute kidney injury) ([place]) Active Problems:   Essential hypertension   Hyperlipidemia   Type II diabetes mellitus, uncontrolled ([place])   Hypokalemia   Normocytic anemia   Hypoalbuminemia   Mild protein malnutrition (HCC)   ARF (acute renal failure) (HCC)  Principal Problem:   AKI (acute kidney injury) ([place]) Likely in the setting of bladder tumor/hydronephrosis. Continue IV fluids. Hold lisinopril. Avoid hypotension. Continue to avoid nephrotoxic medications. Cr has improved somewhat to 1.65 Mention of B hydronephrosis per ED documentation, however no records of CT are available. Will request CT records from [place] and order f/u renal US for now Recheck bmet in  AM  Active Problems:   Hypokalemia replaced Magnesium was supplemented at time of presentation [place] in AM    Essential hypertension Holding lisinopril given ARF Continue amlodipine 10 mg p.o. daily.    Hyperlipidemia Continue atorvastatin 40 mg p.o. daily.    Type II diabetes mellitus, uncontrolled (HCC) Carbohydrate modified diet. Cont with SSI coverage as needed Hgb a1c noted to be 5.5    Normocytic anemia Monitor hematocrit and hemoglobin. Transfuse as needed. Recheck cbc in AM    Hypoalbuminemia   Mild protein malnutrition (HCC) Mild generalized weakness. No muscle wasting noticed. Increase protein intake. Have consulted dietitian  Nausea -seems improved -Tolerated sausage brought by family this AM -Will advance diet as tolerated  DVT prophylaxis: SCD's Code Status: Full Family Communication: Pt in room, family currently at at bedside  Status is: Inpatient   Ongoing diagnostic testing needed not appropriate for outpatient work up and IV treatments appropriate due to intensity of illness or inability to take PO  Dispo: The patient is from: Home              Anticipated d/c is to: Home              Anticipated d/c date is: 2 days              Patient currently is not medically stable to d/c.   Difficult to place patient No       Consultants:     Procedures:     Antimicrobials: Anti-infectives (From admission, onward)   Start     Dose/Rate Route Frequency Ordered Stop   11/04/20 1930  cefTRIAXone (ROCEPHIN) 1 g in sodium chloride 0.9 % 100 mL IVPB  1 g 200 mL/hr over 30 Minutes Intravenous Every 24 hours 11/03/20 2020 11/05/20 2359   11/03/20 1930  cefTRIAXone (ROCEPHIN) 1 g in sodium chloride 0.9 % 100 mL IVPB        1 g 200 mL/hr over 30 Minutes Intravenous  Once 11/03/20 1928 11/03/20 2029      Subjective: Reports feeling better. Tolerating diet better today  Objective: Vitals:   11/04/20 2200 11/05/20 0000  11/05/20 0400 11/05/20 1456  BP: (!) 175/73 (!) 167/71 (!) 167/69 (!) 166/80  Pulse: 76 92 86 89  Resp: 19 (!) 23 19 20   Temp:  98 F (36.7 C) 98.1 F (36.7 C) 98.8 F (37.1 C)  TempSrc:  Oral Oral Oral  SpO2: 96% 94% 97% 96%  Weight:      Height:        Intake/Output Summary (Last 24 hours) at 11/05/2020 1625 Last data filed at 11/05/2020 1500 Gross per 24 hour  Intake 1421.66 ml  Output 2700 ml  Net -1278.34 ml   Filed Weights   11/04/20 1248  Weight: 58.3 kg    Examination: General exam: Awake, laying in bed, in nad Respiratory system: Normal respiratory effort, no wheezing Cardiovascular system: regular rate, s1, s2 Gastrointestinal system: Soft, nondistended, positive BS Central nervous system: CN2-12 grossly intact, strength intact Extremities: Perfused, no clubbing Skin: Normal skin turgor, no notable skin lesions seen Psychiatry: Mood normal // no visual hallucinations   Data Reviewed: I have personally reviewed following labs and imaging studies  CBC: Recent Labs  Lab 11/03/20 1713 11/04/20 0242 11/05/20 0100  WBC 9.5 9.8 9.3  HGB 11.1* 9.6* 8.9*  HCT 33.2* 27.4* 25.1*  MCV 87.8 87.0 85.4  PLT 210 185 497   Basic Metabolic Panel: Recent Labs  Lab 11/03/20 1713 11/03/20 1839 11/03/20 2130 11/04/20 0242 11/05/20 0100  NA 140  --   --  142 140  K 2.4*  --   --  2.7* 3.0*  CL 104  --   --  107 108  CO2 26  --   --  23 22  GLUCOSE 172*  --   --  129* 114*  BUN 24*  --   --  22 16  CREATININE 1.95*  --   --  1.85* 1.65*  CALCIUM 9.2  --   --  8.6* 8.4*  MG  --   --  1.7  --  1.7  PHOS  --  4.1 4.3  --   --    GFR: Estimated Creatinine Clearance: 26.9 mL/min (A) (by C-G formula based on SCr of 1.65 mg/dL (H)). Liver Function Tests: Recent Labs  Lab 11/03/20 1713 11/04/20 0242 11/05/20 0100  AST 16 13* <5*  ALT 15 12 10   ALKPHOS 73 62 60  BILITOT 0.3 0.3 0.3  PROT 6.9 5.9* 5.1*  ALBUMIN 3.4* 2.9* 2.5*   Recent Labs  Lab  11/03/20 1713  LIPASE 31   No results for input(s): AMMONIA in the last 168 hours. Coagulation Profile: No results for input(s): INR, PROTIME in the last 168 hours. Cardiac Enzymes: No results for input(s): CKTOTAL, CKMB, CKMBINDEX, TROPONINI in the last 168 hours. BNP (last 3 results) No results for input(s): PROBNP in the last 8760 hours. HbA1C: Recent Labs    11/03/20 2047  HGBA1C 5.5   CBG: Recent Labs  Lab 11/04/20 1214 11/04/20 1639 11/04/20 2100 11/05/20 0735 11/05/20 1217  GLUCAP 98 207* 88 124* 151*   Lipid Profile: No results for input(s):  CHOL, HDL, LDLCALC, TRIG, CHOLHDL, LDLDIRECT in the last 72 hours. Thyroid Function Tests: No results for input(s): TSH, T4TOTAL, FREET4, T3FREE, THYROIDAB in the last 72 hours. Anemia Panel: No results for input(s): VITAMINB12, FOLATE, FERRITIN, TIBC, IRON, RETICCTPCT in the last 72 hours. Sepsis Labs: No results for input(s): PROCALCITON, LATICACIDVEN in the last 168 hours.  Recent Results (from the past 240 hour(s))  Urine culture     Status: Abnormal   Collection Time: 11/03/20  5:07 PM   Specimen: Urine, Random  Result Value Ref Range Status   Specimen Description URINE, RANDOM  Final   Special Requests ADDED 2226  Final   Culture (A)  Final    <10,000 COLONIES/mL INSIGNIFICANT GROWTH Performed at [facility], [address], [place], [place] [address]    Report Status 11/05/2020 FINAL  Final  SARS CORONAVIRUS 2 (TAT 6-24 HRS) Nasopharyngeal Nasopharyngeal Swab     Status: None   Collection Time: 11/03/20  7:14 PM   Specimen: Nasopharyngeal Swab  Result Value Ref Range Status   SARS Coronavirus 2 NEGATIVE NEGATIVE Final    Comment: (NOTE) SARS-CoV-2 target nucleic acids are NOT DETECTED.  The SARS-CoV-2 RNA is generally detectable in upper and lower respiratory specimens during the acute phase of infection. Negative results do not preclude SARS-CoV-2 infection, do not rule out co-infections with other  pathogens, and should not be used as the sole basis for treatment or other patient management decisions. Negative results must be combined with clinical observations, patient history, and epidemiological information. The expected result is Negative.  Fact Sheet for Patients: SugarRoll.be  Fact Sheet for Healthcare Providers: https://www.woods-mathews.com/  This test is not yet approved or cleared by the [place] FDA and  has been authorized for detection and/or diagnosis of SARS-CoV-2 by FDA under an Emergency Use Authorization (EUA). This EUA will remain  in effect (meaning this test can be used) for the duration of the COVID-19 declaration under Se ction 564(b)(1) of the Act, 21 U.S.C. section 360bbb-3(b)(1), unless the authorization is terminated or revoked sooner.  Performed at [facility], [place] [address] [place], [place] [address]      Radiology Studies: No results found.  Scheduled Meds: . amLODipine  5 mg Oral Daily  . atorvastatin  40 mg Oral Daily  . feeding supplement  237 mL Oral BID BM  . insulin aspart  0-15 Units Subcutaneous TID WC  . pantoprazole  40 mg Oral Daily  . polyethylene glycol  17 g Oral Daily  . potassium chloride  40 mEq Oral BID  . senna-docusate  1 tablet Oral QHS  . sorbitol, milk of mag, mineral oil, glycerin (SMOG) enema  960 mL Rectal Once  . traZODone  100 mg Oral QHS   Continuous Infusions: . cefTRIAXone (ROCEPHIN)  IV Stopped (11/04/20 2112)  . lactated ringers 100 mL/hr at 11/05/20 1347     LOS: 1 day   Marylu Lund, MD [facility] Pager On Amion  If 7PM-7AM, please contact night-coverage 11/05/2020, 4:25 PM

## 2020-11-05 NOTE — Progress Notes (Signed)
Initial Nutrition Assessment  DOCUMENTATION CODES:   Not applicable  INTERVENTION:  Provide Ensure Enlive po BID, each supplement provides 350 kcal and 20 grams of protein.  Encourage adequate PO intake.   NUTRITION DIAGNOSIS:   Increased nutrient needs related to acute illness as evidenced by estimated needs.  GOAL:   Patient will meet greater than or equal to 90% of their needs  MONITOR:   PO intake,Supplement acceptance,Skin,Weight trends,I & O's,Labs  REASON FOR ASSESSMENT:   Consult Assessment of nutrition requirement/status  ASSESSMENT:   65 y.o. female with medical history significant of essential hypertension, hyperlipidemia, history of other nonhemorrhagic CVA, type 2 diabetes presents with abdominal pain associated with decreased urination, fatigue, malaise, decreased appetite, nausea with occasional vomiting. Pt with AKI likely in the setting of bladder tumor.  Pt reports having a decreased appetite however still consumes at least 3 meals a day with no difficulties. Pt with no nausea complaints. Usual body weight unknown to patient. RD to order nutritional supplements to aid in caloric and protein needs. Pt encouraged to eat her food at meals and to drink her supplements. Unable to complete Nutrition-Focused physical exam at this time.   Labs and medications reviewed.   Diet Order:   Diet Order            Diet Carb Modified Fluid consistency: Thin; Room service appropriate? Yes  Diet effective now                 EDUCATION NEEDS:   Not appropriate for education at this time  Skin:  Skin Assessment: Reviewed RN Assessment  Last BM:  Unknown  Height:   Ht Readings from Last 1 Encounters:  11/04/20 5\' 2"  (1.575 m)    Weight:   Wt Readings from Last 1 Encounters:  11/04/20 58.3 kg   BMI:  Body mass index is 23.51 kg/m.  Estimated Nutritional Needs:   Kcal:  1800-1900  Protein:  85-95 grams  Fluid:  >/= 1.8 L/day  Corrin Parker, MS,  RD, LDN RD pager number/after hours weekend pager number on Amion.

## 2020-11-06 ENCOUNTER — Inpatient Hospital Stay (HOSPITAL_COMMUNITY): Payer: Medicare HMO

## 2020-11-06 ENCOUNTER — Other Ambulatory Visit: Payer: Self-pay | Admitting: Urology

## 2020-11-06 DIAGNOSIS — I10 Essential (primary) hypertension: Secondary | ICD-10-CM | POA: Diagnosis not present

## 2020-11-06 DIAGNOSIS — Z01818 Encounter for other preprocedural examination: Secondary | ICD-10-CM

## 2020-11-06 DIAGNOSIS — N179 Acute kidney failure, unspecified: Secondary | ICD-10-CM | POA: Diagnosis not present

## 2020-11-06 DIAGNOSIS — N3289 Other specified disorders of bladder: Secondary | ICD-10-CM | POA: Diagnosis not present

## 2020-11-06 LAB — CBC
HCT: 26 % — ABNORMAL LOW (ref 36.0–46.0)
Hemoglobin: 8.9 g/dL — ABNORMAL LOW (ref 12.0–15.0)
MCH: 29.9 pg (ref 26.0–34.0)
MCHC: 34.2 g/dL (ref 30.0–36.0)
MCV: 87.2 fL (ref 80.0–100.0)
Platelets: 184 10*3/uL (ref 150–400)
RBC: 2.98 MIL/uL — ABNORMAL LOW (ref 3.87–5.11)
RDW: 13.3 % (ref 11.5–15.5)
WBC: 10.3 10*3/uL (ref 4.0–10.5)
nRBC: 0 % (ref 0.0–0.2)

## 2020-11-06 LAB — COMPREHENSIVE METABOLIC PANEL
ALT: 11 U/L (ref 0–44)
AST: 16 U/L (ref 15–41)
Albumin: 2.5 g/dL — ABNORMAL LOW (ref 3.5–5.0)
Alkaline Phosphatase: 69 U/L (ref 38–126)
Anion gap: 9 (ref 5–15)
BUN: 21 mg/dL (ref 8–23)
CO2: 23 mmol/L (ref 22–32)
Calcium: 8.3 mg/dL — ABNORMAL LOW (ref 8.9–10.3)
Chloride: 108 mmol/L (ref 98–111)
Creatinine, Ser: 1.89 mg/dL — ABNORMAL HIGH (ref 0.44–1.00)
GFR, Estimated: 29 mL/min — ABNORMAL LOW (ref >60)
Glucose, Bld: 137 mg/dL — ABNORMAL HIGH (ref 70–99)
Potassium: 3.9 mmol/L (ref 3.5–5.1)
Sodium: 140 mmol/L (ref 135–145)
Total Bilirubin: 0.3 mg/dL (ref 0.3–1.2)
Total Protein: 5.2 g/dL — ABNORMAL LOW (ref 6.5–8.1)

## 2020-11-06 LAB — GLUCOSE, CAPILLARY
Glucose-Capillary: 134 mg/dL — ABNORMAL HIGH (ref 70–99)
Glucose-Capillary: 197 mg/dL — ABNORMAL HIGH (ref 70–99)
Glucose-Capillary: 111 mg/dL — ABNORMAL HIGH (ref 70–99)
Glucose-Capillary: 198 mg/dL — ABNORMAL HIGH (ref 70–99)

## 2020-11-06 IMAGING — DX DG CHEST 1V PORT
1 series · 1 of 1 positions shown · non-contrast
Comparison: [DATE]

CLINICAL DATA: Preop exam.

EXAM:
PORTABLE CHEST 1 VIEW

[chest]
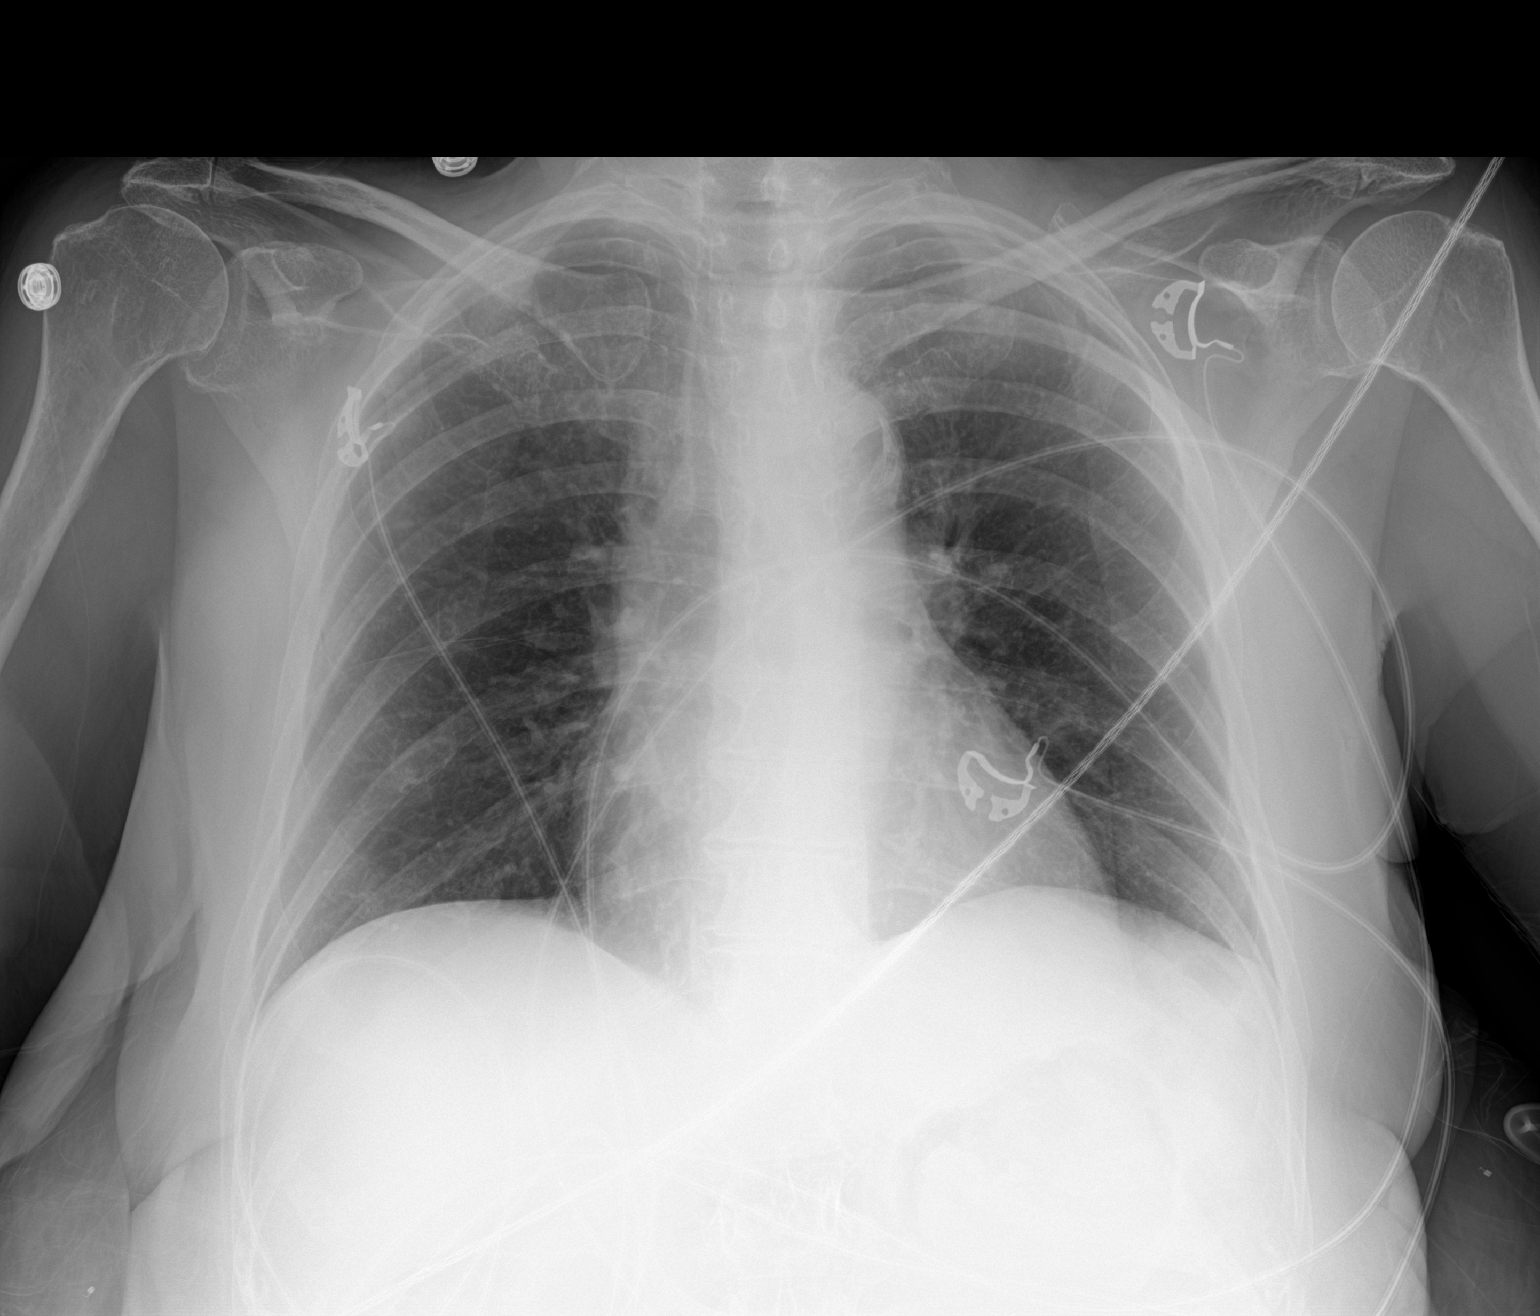

[1 of 1 positions shown; findings below may reference images not displayed]

FINDINGS: The heart size and mediastinal contours are within normal limits.
Both lungs are clear. The visualized skeletal structures are
unremarkable.
IMPRESSION: No active disease.

## 2020-11-06 MED ORDER — CIPROFLOXACIN IN D5W 400 MG/200ML IV SOLN
400.0000 mg | Freq: Once | INTRAVENOUS | Status: AC
  Administered 2020-11-07: 400 mg via INTRAVENOUS
  Filled 2020-11-06 (×2): qty 200

## 2020-11-06 MED ORDER — HYDRALAZINE HCL 20 MG/ML IJ SOLN
10.0000 mg | INTRAMUSCULAR | Status: DC | PRN
  Administered 2020-11-06 – 2020-11-07 (×3): 10 mg via INTRAVENOUS
  Filled 2020-11-06 (×3): qty 1

## 2020-11-06 MED ORDER — MUPIROCIN 2 % EX OINT
1.0000 "application " | TOPICAL_OINTMENT | Freq: Two times a day (BID) | CUTANEOUS | Status: DC
  Administered 2020-11-06 – 2020-11-09 (×5): 1 via NASAL
  Filled 2020-11-06 (×2): qty 22

## 2020-11-06 NOTE — Progress Notes (Signed)
   11/06/20 0500  Vitals  BP (!) 170/73   PRN IV metoprolol given

## 2020-11-06 NOTE — Evaluation (Signed)
Physical Therapy Evaluation Patient Details Name: Jacqueline Leach MRN: [id number] DOB: 06/02/1956 Today's Date: 11/06/2020   History of Present Illness  Pt is a 65 y.o. female admitted 11/03/20 with abdominal pain, malaise, nausea/vomiting since last month; pt recently seen at [facility] 10/29/20 with CT scan showing bladder tumor and constipation (was scheduled to see urology 11/20/20). Workup for AKI. PMH includes HTN, CVA (2020), DM2.    Clinical Impression  Pt presents with an overall decrease in functional mobility secondary to above. PTA, pt mod indep with ambulation/ADLs/household tasks with intermittent use of SPC/RW, lives with daughters and grandkids. Today, pt able to mobilize throughout room and perform ADL tasks with RW and supervision for safety. Pt would benefit from continued acute PT services to maximize functional mobility and independence prior to d/c home.     Follow Up Recommendations No PT follow up    Equipment Recommendations  None recommended by PT    Recommendations for Other Services       Precautions / Restrictions        Mobility  Bed Mobility Overal bed mobility: Modified Independent             General bed mobility comments: HOB elevated    Transfers Overall transfer level: Modified independent Equipment used: Rolling walker (2 wheeled);None             General transfer comment: Mod indep with and without RW, standing from EOB, low toilet height (with grab bar) and reclined without assist  Ambulation/Gait Ambulation/Gait assistance: Supervision Gait Distance (Feet): 40 Feet Assistive device: Rolling walker (2 wheeled) Gait Pattern/deviations: Step-through pattern;Decreased stride length;Antalgic Gait velocity: Decreased   General Gait Details: Slow, steady gait with RW and supervision for safety; pt endorses RLE "limp since I had my stroke I guess" . Pt declind hallway ambulation secondary to not having another Depends to  wear  Stairs            Wheelchair Mobility    Modified Rankin (Stroke Patients Only)       Balance Overall balance assessment: Needs assistance   Sitting balance-Leahy Scale: Good       Standing balance-Leahy Scale: Fair Standing balance comment: Able to stand without UE suppor to wash hands at sink, perform posterior pericare and don underwear                             Pertinent Vitals/Pain Pain Assessment: Faces Faces Pain Scale: No hurt Pain Intervention(s): Monitored during session    Home Living Family/patient expects to be discharged to:: Private residence Living Arrangements: Children;Other relatives Available Help at Discharge: Family;Available 24 hours/day Type of Home: House Home Access: Stairs to enter Entrance Stairs-Rails: Right Entrance Stairs-Number of Steps: 3 Home Layout: One level Home Equipment: Clinical cytogeneticist - 2 wheels;Kasandra Knudsen - single point Additional Comments: Lives with two daughters and two grandkids; "someone is always there and can help if I need it"    Prior Function Level of Independence: Independent with assistive device(s)         Comments: Mod indep with intermittent use of SPC or RW. Reports mod indep with ADLs and household tasks. Was driving, but daughters drive more often now     Hand Dominance        Extremity/Trunk Assessment   Upper Extremity Assessment Upper Extremity Assessment: Generalized weakness    Lower Extremity Assessment Lower Extremity Assessment: Generalized weakness  Communication   Communication: No difficulties  Cognition Arousal/Alertness: Awake/alert Behavior During Therapy: WFL for tasks assessed/performed Overall Cognitive Status: Within Functional Limits for tasks assessed                                 General Comments: WFL for simple tasks. Sounded anxious on phone with daughter regarding family taking home her Depends, but able to be redirected  through this and reassured of other options      General Comments General comments (skin integrity, edema, etc.): SpO2 >/90% on RA    Exercises     Assessment/Plan    PT Assessment Patient needs continued PT services  PT Problem List Decreased strength;Decreased activity tolerance;Decreased balance;Decreased mobility       PT Treatment Interventions DME instruction;Gait training;Stair training;Functional mobility training;Therapeutic activities;Therapeutic exercise;Balance training;Patient/family education    PT Goals (Current goals can be found in the Care Plan section)  Acute Rehab PT Goals Patient Stated Goal: I hope to get back home soon PT Goal Formulation: With patient Time For Goal Achievement: 11/20/20 Potential to Achieve Goals: Good    Frequency Min 3X/week   Barriers to discharge        Co-evaluation               AM-PAC PT "6 Clicks" Mobility  Outcome Measure Help needed turning from your back to your side while in a flat bed without using bedrails?: None Help needed moving from lying on your back to sitting on the side of a flat bed without using bedrails?: None Help needed moving to and from a bed to a chair (including a wheelchair)?: None Help needed standing up from a chair using your arms (e.g., wheelchair or bedside chair)?: None Help needed to walk in hospital room?: A Little Help needed climbing 3-5 steps with a railing? : A Little 6 Click Score: 22    End of Session   Activity Tolerance: Patient tolerated treatment well Patient left: in chair;with call bell/phone within reach;with chair alarm set Nurse Communication: Mobility status PT Visit Diagnosis: Other abnormalities of gait and mobility (R26.89)    Time: 9983-3825 PT Time Calculation (min) (ACUTE ONLY): 24 min   Charges:   PT Evaluation $PT Eval Low Complexity: 1 Low PT Treatments $Therapeutic Activity: 8-22 mins   Mabeline Caras, PT, DPT Acute Rehabilitation Services   Pager [phone] Office [phone]  Derry Lory 11/06/2020, 9:20 AM

## 2020-11-06 NOTE — Progress Notes (Addendum)
PROGRESS NOTE    Jacqueline Leach  HYI:[id number] DOB: Mar 25, 1956 DOA: 11/03/2020 [facility]CP: [facility]    Brief Narrative:  65 y.o. female with medical history significant of essential hypertension, hyperlipidemia, history of other nonhemorrhagic CVA, type 2 diabetes who is coming to the emergency department due to abdominal pain associated with decreased urination, fatigue, malaise, decreased appetite, nausea with occasional vomiting since last month.  She has been constipated for the last 2 weeks.  Denies melena or hematochezia.  Also has urinary frequency with occasional dysuria and tenesmus.  She was seen at [facility] on 10/29/2020 where she had a CT scan that showed a bladder tumor and constipation.  She is a scheduled to be seen by urology on 11/20/2020, but the patient states that her symptoms were worsening so she decided to come to the emergency department.  She denies fevers, but complains of chills and left-sided frontal headaches.  Denies sore throat, rhinorrhea, wheezing, hemoptysis, dyspnea, chest pain, palpitations, diaphoresis,, orthopnea, PND or pitting edema of the lower extremities.  No polyuria, polydipsia, polyphagia or blurred vision.  Assessment & Plan:   Principal Problem:   AKI (acute kidney injury) ([place]) Active Problems:   Essential hypertension   Hyperlipidemia   Type II diabetes mellitus, uncontrolled ([place])   Hypokalemia   Normocytic anemia   Hypoalbuminemia   Mild protein malnutrition (HCC)   ARF (acute renal failure) (HCC)   Preop examination  Principal Problem:   AKI (acute kidney injury) ([place]) Likely in the setting of bladder tumor/hydronephrosis. Continue IV fluids. Hold lisinopril. Avoid hypotension. Continue to avoid nephrotoxic medications. Cr remains stable at 1.89 Renal US reviewed, findings confirming bladder mass with B mod-severe hydronephrosis Urology consulted, appreciate assistance CXR reviewed, clear. Pt without chest  pain or sob. EKG reviewed, nonischemic. At this point, benefit to TURBT would outweigh perioperative medical risk Recheck bmet in AM  Active Problems:   Hypokalemia replaced Magnesium was supplemented at time of presentation Cont to follow lytes and replace as needed    Essential hypertension Holding lisinopril given ARF Continue amlodipine 10 mg p.o. daily. Currently suboptimally controlled Will cont on PRN hydralazine    Hyperlipidemia Continue atorvastatin 40 mg p.o. daily.    Type II diabetes mellitus, uncontrolled (HCC) Carbohydrate modified diet. Cont with SSI coverage as needed Hgb a1c noted to be 5.5 Glucose trends appear stable    Normocytic anemia Monitor hematocrit and hemoglobin. Transfuse as needed. Repeat cbc in AM    Hypoalbuminemia   Mild protein malnutrition (HCC) Mild generalized weakness. No muscle wasting noticed. Increase protein intake. Dietitian consulted  Nausea -Resolved -Advanced diet successfully  DVT prophylaxis: SCD's Code Status: Full Family Communication: Pt in room, family currently not at at bedside  Status is: Inpatient   Ongoing diagnostic testing needed not appropriate for outpatient work up and IV treatments appropriate due to intensity of illness or inability to take PO  Dispo: The patient is from: Home              Anticipated d/c is to: Home              Anticipated d/c date is: > 3 days              Patient currently is not medically stable to d/c.   Difficult to place patient No       Consultants:   Urology  Procedures:     Antimicrobials: Anti-infectives (From admission, onward)   Start     Dose/Rate  Route Frequency Ordered Stop   11/07/20 0730  ciprofloxacin (CIPRO) IVPB 400 mg        400 mg 200 mL/hr over 60 Minutes Intravenous  Once 11/06/20 1658     11/04/20 1930  cefTRIAXone (ROCEPHIN) 1 g in sodium chloride 0.9 % 100 mL IVPB        1 g 200 mL/hr over 30 Minutes Intravenous Every 24 hours  11/03/20 2020 11/05/20 2116   11/03/20 1930  cefTRIAXone (ROCEPHIN) 1 g in sodium chloride 0.9 % 100 mL IVPB        1 g 200 mL/hr over 30 Minutes Intravenous  Once 11/03/20 1928 11/03/20 2029      Subjective: Feeling sad about bladder mass, tearful  Objective: Vitals:   11/06/20 1200 11/06/20 1400 11/06/20 1422 11/06/20 1513  BP: (!) 186/82 (!) 185/83 (!) 194/83 (!) 177/81  Pulse: 82 92 93 84  Resp: (!) 29 (!) 25 15 18   Temp:    98 F (36.7 C)  TempSrc:    Oral  SpO2: 97% 96% 95% 97%  Weight:      Height:        Intake/Output Summary (Last 24 hours) at 11/06/2020 1757 Last data filed at 11/06/2020 1043 Gross per 24 hour  Intake 2031.48 ml  Output 400 ml  Net 1631.48 ml   Filed Weights   11/04/20 1248  Weight: 58.3 kg    Examination: General exam: Conversant, in no acute distress Respiratory system: normal chest rise, clear, no audible wheezing Cardiovascular system: regular rhythm, s1-s2 Gastrointestinal system: Nondistended, nontender, pos BS Central nervous system: No seizures, no tremors Extremities: No cyanosis, no joint deformities Skin: No rashes, no pallor Psychiatry: Appears sad // no auditory hallucinations   Data Reviewed: I have personally reviewed following labs and imaging studies  CBC: Recent Labs  Lab 11/03/20 1713 11/04/20 0242 11/05/20 0100 11/06/20 0136  WBC 9.5 9.8 9.3 10.3  HGB 11.1* 9.6* 8.9* 8.9*  HCT 33.2* 27.4* 25.1* 26.0*  MCV 87.8 87.0 85.4 87.2  PLT 210 185 169 643   Basic Metabolic Panel: Recent Labs  Lab 11/03/20 1713 11/03/20 1839 11/03/20 2130 11/04/20 0242 11/05/20 0100 11/06/20 0136  NA 140  --   --  142 140 140  K 2.4*  --   --  2.7* 3.0* 3.9  CL 104  --   --  107 108 108  CO2 26  --   --  23 22 23   GLUCOSE 172*  --   --  129* 114* 137*  BUN 24*  --   --  22 16 21   CREATININE 1.95*  --   --  1.85* 1.65* 1.89*  CALCIUM 9.2  --   --  8.6* 8.4* 8.3*  MG  --   --  1.7  --  1.7  --   PHOS  --  4.1 4.3  --   --    --    GFR: Estimated Creatinine Clearance: 23.5 mL/min (A) (by C-G formula based on SCr of 1.89 mg/dL (H)). Liver Function Tests: Recent Labs  Lab 11/03/20 1713 11/04/20 0242 11/05/20 0100 11/06/20 0136  AST 16 13* <5* 16  ALT 15 12 10 11   ALKPHOS 73 62 60 69  BILITOT 0.3 0.3 0.3 0.3  PROT 6.9 5.9* 5.1* 5.2*  ALBUMIN 3.4* 2.9* 2.5* 2.5*   Recent Labs  Lab 11/03/20 1713  LIPASE 31   No results for input(s): AMMONIA in the last 168 hours. Coagulation Profile: No results  for input(s): INR, PROTIME in the last 168 hours. Cardiac Enzymes: No results for input(s): CKTOTAL, CKMB, CKMBINDEX, TROPONINI in the last 168 hours. BNP (last 3 results) No results for input(s): PROBNP in the last 8760 hours. HbA1C: Recent Labs    11/03/20 2047  HGBA1C 5.5   CBG: Recent Labs  Lab 11/05/20 1742 11/05/20 2128 11/06/20 0729 11/06/20 1146 11/06/20 1657  GLUCAP 133* 164* 134* 197* 111*   Lipid Profile: No results for input(s): CHOL, HDL, LDLCALC, TRIG, CHOLHDL, LDLDIRECT in the last 72 hours. Thyroid Function Tests: No results for input(s): TSH, T4TOTAL, FREET4, T3FREE, THYROIDAB in the last 72 hours. Anemia Panel: No results for input(s): VITAMINB12, FOLATE, FERRITIN, TIBC, IRON, RETICCTPCT in the last 72 hours. Sepsis Labs: No results for input(s): PROCALCITON, LATICACIDVEN in the last 168 hours.  Recent Results (from the past 240 hour(s))  Urine culture     Status: Abnormal   Collection Time: 11/03/20  5:07 PM   Specimen: Urine, Random  Result Value Ref Range Status   Specimen Description URINE, RANDOM  Final   Special Requests ADDED 2226  Final   Culture (A)  Final    <10,000 COLONIES/mL INSIGNIFICANT GROWTH Performed at [facility], [address], [place], [place] [address]    Report Status 11/05/2020 FINAL  Final  SARS CORONAVIRUS 2 (TAT 6-24 HRS) Nasopharyngeal Nasopharyngeal Swab     Status: None   Collection Time: 11/03/20  7:14 PM   Specimen:  Nasopharyngeal Swab  Result Value Ref Range Status   SARS Coronavirus 2 NEGATIVE NEGATIVE Final    Comment: (NOTE) SARS-CoV-2 target nucleic acids are NOT DETECTED.  The SARS-CoV-2 RNA is generally detectable in upper and lower respiratory specimens during the acute phase of infection. Negative results do not preclude SARS-CoV-2 infection, do not rule out co-infections with other pathogens, and should not be used as the sole basis for treatment or other patient management decisions. Negative results must be combined with clinical observations, patient history, and epidemiological information. The expected result is Negative.  Fact Sheet for Patients: SugarRoll.be  Fact Sheet for Healthcare Providers: https://www.woods-mathews.com/  This test is not yet approved or cleared by the [place] FDA and  has been authorized for detection and/or diagnosis of SARS-CoV-2 by FDA under an Emergency Use Authorization (EUA). This EUA will remain  in effect (meaning this test can be used) for the duration of the COVID-19 declaration under Se ction 564(b)(1) of the Act, 21 U.S.C. section 360bbb-3(b)(1), unless the authorization is terminated or revoked sooner.  Performed at [facility], [place] [address], [place], [place] [address]      Radiology Studies: US RENAL  Result Date: 11/05/2020 CLINICAL DATA:  Bladder mass. EXAM: RENAL / URINARY TRACT ULTRASOUND COMPLETE COMPARISON:  None. FINDINGS: Right Kidney: Renal measurements: 10.6 cm x 5.0 cm x 4.3 cm = volume: 118.78 mL. Echogenicity within normal limits. No mass or is visualized. There is moderate severity right-sided hydronephrosis. Left Kidney: Renal measurements: 13.0 cm x 5.9 cm x 4.3 cm = volume: 169.16 mL. Echogenicity within normal limits. No mass is visualized. There is moderate severity left-sided hydronephrosis. Bladder: A heterogeneous thickened right-sided urinary bladder wall is  noted. Other: None. IMPRESSION: 1. Moderate severity bilateral hydronephrosis. 2. Asymmetric heterogeneous urinary bladder wall thickening, as described above. Sequelae associated with an underlying neoplastic process cannot be excluded. Electronically Signed   By: Virgina Norfolk M.D.   On: 11/05/2020 19:59   DG CHEST PORT 1 VIEW  Result Date: 11/06/2020 CLINICAL DATA:  Preop exam. EXAM: PORTABLE CHEST 1 VIEW COMPARISON:  08/09/2019 FINDINGS: The heart size and mediastinal contours are within normal limits. Both lungs are clear. The visualized skeletal structures are unremarkable. IMPRESSION: No active disease. Electronically Signed   By: Nolon Nations M.D.   On: 11/06/2020 15:56    Scheduled Meds: . amLODipine  5 mg Oral Daily  . atorvastatin  40 mg Oral Daily  . feeding supplement  237 mL Oral BID BM  . insulin aspart  0-15 Units Subcutaneous TID WC  . pantoprazole  40 mg Oral Daily  . polyethylene glycol  17 g Oral Daily  . senna-docusate  1 tablet Oral QHS  . sorbitol, milk of mag, mineral oil, glycerin (SMOG) enema  960 mL Rectal Once  . traZODone  100 mg Oral QHS   Continuous Infusions: . [START ON 11/07/2020] ciprofloxacin    . lactated ringers 100 mL/hr at 11/06/20 1043     LOS: 2 days   Marylu Lund, MD [facility] Pager On Amion  If 7PM-7AM, please contact night-coverage 11/06/2020, 5:57 PM

## 2020-11-06 NOTE — Consult Note (Signed)
Urology Consult   Physician requesting consult: Marylu Lund  Reason for consult: Bladder mass  History of Present Illness: Jacqueline Leach is a 65 y.o. female with PMH significant for HTN, hyperlipidemia, CVA, and DM II who presented to the ED on 11/03/20 for eval of abdominal pain, fatigue, nausea, occasional vomiting, urinary frequency, and dysuria x 1 month.  She has had constipation x 2 weeks. She was evaled for the same issues at [place] on 10/29/20 where CT scan showed bladder tumor, bilateral hydro, and constipation.  She was then scheduled to see urology as an outpt on 11/20/20 but her sx progressed so she came to [facility]. In ED WBC 9.5, Cr 1.95, H/H 11/33, UA nitrite negative with many bacteria.  Pt was admitted for treatment of AKI, normocytic anemia, and persistent nausea.   Urine culture insignificant growth.  RUS on 11/05/20 with moderate severity bilateral hydro and asymmetric heterogeneous urinary bladder wall thickening.   She denies a history of UTIs, STDs, urolithiasis, GU malignancy/trauma/surgery.  She had one episode of [facility] over the past month.  She has SUI but no UUI.  Pt stopped smoking in 1985 after smoking approx 20 years.  She denies asbestos and chemical exposure.   Past Medical History:  Diagnosis Date  . Essential hypertension 08/12/2019  . Hyperlipidemia 08/12/2019  . Stroke ([place])   . Type II diabetes mellitus, uncontrolled ([place]) 08/12/2019    History reviewed. No pertinent surgical history.   Current Hospital Medications:  Home meds:  . Current Meds  Medication Sig  . acetaminophen (TYLENOL) 325 MG tablet Take 2 tablets (650 mg total) by mouth every 4 (four) hours as needed for mild pain (or temp > 37.5 C (99.5 F)).  Marland Kitchen amLODipine (NORVASC) 5 MG tablet Take 5 mg by mouth daily.  Marland Kitchen atorvastatin (LIPITOR) 40 MG tablet Take 40 mg by mouth at bedtime.  . insulin NPH Human (NOVOLIN N) 100 UNIT/ML injection Inject 10 Units into the skin as needed (If higher  than 200 mg).  Marland Kitchen lisinopril (ZESTRIL) 20 MG tablet Take 20 mg by mouth daily.  Marland Kitchen senna-docusate (SENOKOT-S) 8.6-50 MG tablet Take 1 tablet by mouth at bedtime. (Patient taking differently: Take 1 tablet by mouth at bedtime as needed for mild constipation.)  . traZODone (DESYREL) 100 MG tablet Take 100 mg by mouth at bedtime.  . [DISCONTINUED] aspirin 81 MG chewable tablet Chew 1 tablet (81 mg total) by mouth daily.    Scheduled Meds: . amLODipine  5 mg Oral Daily  . atorvastatin  40 mg Oral Daily  . feeding supplement  237 mL Oral BID BM  . insulin aspart  0-15 Units Subcutaneous TID WC  . pantoprazole  40 mg Oral Daily  . polyethylene glycol  17 g Oral Daily  . senna-docusate  1 tablet Oral QHS  . sorbitol, milk of mag, mineral oil, glycerin (SMOG) enema  960 mL Rectal Once  . traZODone  100 mg Oral QHS   Continuous Infusions: . lactated ringers 100 mL/hr at 11/06/20 1043   PRN Meds:.acetaminophen **OR** acetaminophen, hydrALAZINE, metoprolol tartrate, prochlorperazine  Allergies:  Allergies  Allergen Reactions  . Penicillins Rash    Did it involve swelling of the face/tongue/throat, SOB, or low BP? Yes Did it involve sudden or severe rash/hives, skin peeling, or any reaction on the inside of your mouth or nose? Yes Did you need to seek medical attention at a hospital or doctor's office? Yes When did it last happen?approx 65yo If all above  answers are "NO", may proceed with cephalosporin use.   . Sulfa Antibiotics Rash    Family History  Problem Relation Age of Onset  . Hypertension Mother     Social History:Quit smoking 1985 after smoking ~20 years  ROS: A complete review of systems was performed.  All systems are negative except for pertinent findings as noted.  Physical Exam:  Vital signs in last 24 hours: Temp:  [98 F (36.7 C)-98.4 F (36.9 C)] 98 F (36.7 C) (02/25 1513) Pulse Rate:  [74-93] 84 (02/25 1513) Resp:  [15-29] 18 (02/25 1513) BP:  (154-194)/(61-83) 177/81 (02/25 1513) SpO2:  [93 %-98 %] 97 % (02/25 1513) Constitutional:  Alert and oriented, No acute distress Cardiovascular: Regular rate and rhythm Respiratory: Normal respiratory effort GI: Abdomen is soft, nontender, nondistended, no abdominal masses GU: No CVA tenderness Lymphatic: No lymphadenopathy Neurologic: Grossly intact, no focal deficits Psychiatric: Normal mood and affect  Laboratory Data:  Recent Labs    11/03/20 1713 11/04/20 0242 11/05/20 0100 11/06/20 0136  WBC 9.5 9.8 9.3 10.3  HGB 11.1* 9.6* 8.9* 8.9*  HCT 33.2* 27.4* 25.1* 26.0*  PLT 210 185 169 184    Recent Labs    11/03/20 1713 11/04/20 0242 11/05/20 0100 11/06/20 0136  NA 140 142 140 140  K 2.4* 2.7* 3.0* 3.9  CL 104 107 108 108  GLUCOSE 172* 129* 114* 137*  BUN 24* 22 16 21   CALCIUM 9.2 8.6* 8.4* 8.3*  CREATININE 1.95* 1.85* 1.65* 1.89*     Results for orders placed or performed during the hospital encounter of 11/03/20 (from the past 24 hour(s))  Glucose, capillary     Status: Abnormal   Collection Time: 11/05/20  5:42 PM  Result Value Ref Range   Glucose-Capillary 133 (H) 70 - 99 mg/dL  Glucose, capillary     Status: Abnormal   Collection Time: 11/05/20  9:28 PM  Result Value Ref Range   Glucose-Capillary 164 (H) 70 - 99 mg/dL  Comprehensive metabolic panel     Status: Abnormal   Collection Time: 11/06/20  1:36 AM  Result Value Ref Range   Sodium 140 135 - 145 mmol/L   Potassium 3.9 3.5 - 5.1 mmol/L   Chloride 108 98 - 111 mmol/L   CO2 23 22 - 32 mmol/L   Glucose, Bld 137 (H) 70 - 99 mg/dL   BUN 21 8 - 23 mg/dL   Creatinine, Ser 1.89 (H) 0.44 - 1.00 mg/dL   Calcium 8.3 (L) 8.9 - 10.3 mg/dL   Total Protein 5.2 (L) 6.5 - 8.1 g/dL   Albumin 2.5 (L) 3.5 - 5.0 g/dL   AST 16 15 - 41 U/L   ALT 11 0 - 44 U/L   Alkaline Phosphatase 69 38 - 126 U/L   Total Bilirubin 0.3 0.3 - 1.2 mg/dL   GFR, Estimated 29 (L) >60 mL/min   Anion gap 9 5 - 15  CBC     Status:  Abnormal   Collection Time: 11/06/20  1:36 AM  Result Value Ref Range   WBC 10.3 4.0 - 10.5 K/uL   RBC 2.98 (L) 3.87 - 5.11 MIL/uL   Hemoglobin 8.9 (L) 12.0 - 15.0 g/dL   HCT 26.0 (L) 36.0 - 46.0 %   MCV 87.2 80.0 - 100.0 fL   MCH 29.9 26.0 - 34.0 pg   MCHC 34.2 30.0 - 36.0 g/dL   RDW 13.3 11.5 - 15.5 %   Platelets 184 150 - 400 K/uL  nRBC 0.0 0.0 - 0.2 %  Glucose, capillary     Status: Abnormal   Collection Time: 11/06/20  7:29 AM  Result Value Ref Range   Glucose-Capillary 134 (H) 70 - 99 mg/dL  Glucose, capillary     Status: Abnormal   Collection Time: 11/06/20 11:46 AM  Result Value Ref Range   Glucose-Capillary 197 (H) 70 - 99 mg/dL   Recent Results (from the past 240 hour(s))  Urine culture     Status: Abnormal   Collection Time: 11/03/20  5:07 PM   Specimen: Urine, Random  Result Value Ref Range Status   Specimen Description URINE, RANDOM  Final   Special Requests ADDED 2226  Final   Culture (A)  Final    <10,000 COLONIES/mL INSIGNIFICANT GROWTH Performed at [facility] [address], [place], [place] [address]    Report Status 11/05/2020 FINAL  Final  SARS CORONAVIRUS 2 (TAT 6-24 HRS) Nasopharyngeal Nasopharyngeal Swab     Status: None   Collection Time: 11/03/20  7:14 PM   Specimen: Nasopharyngeal Swab  Result Value Ref Range Status   SARS Coronavirus 2 NEGATIVE NEGATIVE Final    Comment: (NOTE) SARS-CoV-2 target nucleic acids are NOT DETECTED.  The SARS-CoV-2 RNA is generally detectable in upper and lower respiratory specimens during the acute phase of infection. Negative results do not preclude SARS-CoV-2 infection, do not rule out co-infections with other pathogens, and should not be used as the sole basis for treatment or other patient management decisions. Negative results must be combined with clinical observations, patient history, and epidemiological information. The expected result is Negative.  Fact Sheet for  Patients: SugarRoll.be  Fact Sheet for Healthcare Providers: https://www.woods-mathews.com/  This test is not yet approved or cleared by the [place] FDA and  has been authorized for detection and/or diagnosis of SARS-CoV-2 by FDA under an Emergency Use Authorization (EUA). This EUA will remain  in effect (meaning this test can be used) for the duration of the COVID-19 declaration under Se ction 564(b)(1) of the Act, 21 U.S.C. section 360bbb-3(b)(1), unless the authorization is terminated or revoked sooner.  Performed at [facility], [place] [address], [place], [place] [address]     Renal Function: Recent Labs    11/03/20 1713 11/04/20 0242 11/05/20 0100 11/06/20 0136  CREATININE 1.95* 1.85* 1.65* 1.89*   Estimated Creatinine Clearance: 23.5 mL/min (A) (by C-G formula based on SCr of 1.89 mg/dL (H)).  Radiologic Imaging: US RENAL  Result Date: 11/05/2020 CLINICAL DATA:  Bladder mass. EXAM: RENAL / URINARY TRACT ULTRASOUND COMPLETE COMPARISON:  None. FINDINGS: Right Kidney: Renal measurements: 10.6 cm x 5.0 cm x 4.3 cm = volume: 118.78 mL. Echogenicity within normal limits. No mass or is visualized. There is moderate severity right-sided hydronephrosis. Left Kidney: Renal measurements: 13.0 cm x 5.9 cm x 4.3 cm = volume: 169.16 mL. Echogenicity within normal limits. No mass is visualized. There is moderate severity left-sided hydronephrosis. Bladder: A heterogeneous thickened right-sided urinary bladder wall is noted. Other: None. IMPRESSION: 1. Moderate severity bilateral hydronephrosis. 2. Asymmetric heterogeneous urinary bladder wall thickening, as described above. Sequelae associated with an underlying neoplastic process cannot be excluded. Electronically Signed   By: Virgina Norfolk M.D.   On: 11/05/2020 19:59    Impression/Recommendation:  Bladder tumor with bilateral hydronephrosis--pt will be taken to the OR tomorrow by  Dr. Junious Silk for cysto, TURBT, retrograde pyelogram, and bilateral ureteral stents.  Continue to monitor Cr which should improve after stent placement.  Debbrah Alar 11/06/2020, 3:24 PM

## 2020-11-07 ENCOUNTER — Inpatient Hospital Stay (HOSPITAL_COMMUNITY): Payer: Medicare HMO

## 2020-11-07 ENCOUNTER — Inpatient Hospital Stay (HOSPITAL_COMMUNITY): Payer: Medicare HMO | Admitting: Certified Registered Nurse Anesthetist

## 2020-11-07 ENCOUNTER — Encounter (HOSPITAL_COMMUNITY)
Admission: EM | Disposition: A | Discharge status: Home / Self Care | Payer: Self-pay | Source: Home / Self Care | Attending: Internal Medicine

## 2020-11-07 DIAGNOSIS — N3289 Other specified disorders of bladder: Secondary | ICD-10-CM | POA: Diagnosis not present

## 2020-11-07 DIAGNOSIS — N179 Acute kidney failure, unspecified: Secondary | ICD-10-CM | POA: Diagnosis not present

## 2020-11-07 DIAGNOSIS — I10 Essential (primary) hypertension: Secondary | ICD-10-CM | POA: Diagnosis not present

## 2020-11-07 HISTORY — PX: CYSTOSCOPY W/ URETERAL STENT PLACEMENT: SHX1429

## 2020-11-07 HISTORY — PX: TRANSURETHRAL RESECTION OF BLADDER TUMOR: SHX2575

## 2020-11-07 LAB — COMPREHENSIVE METABOLIC PANEL
ALT: 11 U/L (ref 0–44)
AST: 16 U/L (ref 15–41)
Albumin: 2.9 g/dL — ABNORMAL LOW (ref 3.5–5.0)
Alkaline Phosphatase: 81 U/L (ref 38–126)
Anion gap: 8 (ref 5–15)
BUN: 21 mg/dL (ref 8–23)
CO2: 24 mmol/L (ref 22–32)
Calcium: 8.9 mg/dL (ref 8.9–10.3)
Chloride: 108 mmol/L (ref 98–111)
Creatinine, Ser: 1.98 mg/dL — ABNORMAL HIGH (ref 0.44–1.00)
GFR, Estimated: 28 mL/min — ABNORMAL LOW (ref >60)
Glucose, Bld: 180 mg/dL — ABNORMAL HIGH (ref 70–99)
Potassium: 3.1 mmol/L — ABNORMAL LOW (ref 3.5–5.1)
Sodium: 140 mmol/L (ref 135–145)
Total Bilirubin: 0.5 mg/dL (ref 0.3–1.2)
Total Protein: 6 g/dL — ABNORMAL LOW (ref 6.5–8.1)

## 2020-11-07 LAB — CBC
HCT: 28.8 % — ABNORMAL LOW (ref 36.0–46.0)
Hemoglobin: 9.7 g/dL — ABNORMAL LOW (ref 12.0–15.0)
MCH: 28.9 pg (ref 26.0–34.0)
MCHC: 33.7 g/dL (ref 30.0–36.0)
MCV: 85.7 fL (ref 80.0–100.0)
Platelets: 194 10*3/uL (ref 150–400)
RBC: 3.36 MIL/uL — ABNORMAL LOW (ref 3.87–5.11)
RDW: 13.2 % (ref 11.5–15.5)
WBC: 11.6 10*3/uL — ABNORMAL HIGH (ref 4.0–10.5)
nRBC: 0 % (ref 0.0–0.2)

## 2020-11-07 LAB — GLUCOSE, CAPILLARY
Glucose-Capillary: 180 mg/dL — ABNORMAL HIGH (ref 70–99)
Glucose-Capillary: 161 mg/dL — ABNORMAL HIGH (ref 70–99)
Glucose-Capillary: 290 mg/dL — ABNORMAL HIGH (ref 70–99)
Glucose-Capillary: 200 mg/dL — ABNORMAL HIGH (ref 70–99)

## 2020-11-07 LAB — SURGICAL PCR SCREEN
MRSA, PCR: NEGATIVE
Staphylococcus aureus: NEGATIVE

## 2020-11-07 SURGERY — TURBT (TRANSURETHRAL RESECTION OF BLADDER TUMOR)
Anesthesia: General | Site: Ureter

## 2020-11-07 MED ORDER — DEXAMETHASONE SODIUM PHOSPHATE 10 MG/ML IJ SOLN
INTRAMUSCULAR | Status: AC
  Filled 2020-11-07: qty 1

## 2020-11-07 MED ORDER — SODIUM CHLORIDE 0.9 % IR SOLN
Status: DC | PRN
  Administered 2020-11-07: 1000 mL

## 2020-11-07 MED ORDER — FENTANYL CITRATE (PF) 100 MCG/2ML IJ SOLN
INTRAMUSCULAR | Status: AC
  Filled 2020-11-07: qty 2

## 2020-11-07 MED ORDER — PHENYLEPHRINE 40 MCG/ML (10ML) SYRINGE FOR IV PUSH (FOR BLOOD PRESSURE SUPPORT)
PREFILLED_SYRINGE | INTRAVENOUS | Status: DC | PRN
  Administered 2020-11-07 (×3): 120 ug via INTRAVENOUS

## 2020-11-07 MED ORDER — LIDOCAINE 2% (20 MG/ML) 5 ML SYRINGE
INTRAMUSCULAR | Status: DC | PRN
  Administered 2020-11-07: 60 mg via INTRAVENOUS

## 2020-11-07 MED ORDER — ONDANSETRON HCL 4 MG/2ML IJ SOLN
INTRAMUSCULAR | Status: AC
  Filled 2020-11-07: qty 2

## 2020-11-07 MED ORDER — LIDOCAINE HCL (PF) 2 % IJ SOLN
INTRAMUSCULAR | Status: AC
  Filled 2020-11-07: qty 5

## 2020-11-07 MED ORDER — PHENYLEPHRINE 40 MCG/ML (10ML) SYRINGE FOR IV PUSH (FOR BLOOD PRESSURE SUPPORT)
PREFILLED_SYRINGE | INTRAVENOUS | Status: AC
  Filled 2020-11-07: qty 10

## 2020-11-07 MED ORDER — FENTANYL CITRATE (PF) 100 MCG/2ML IJ SOLN
INTRAMUSCULAR | Status: DC | PRN
  Administered 2020-11-07 (×3): 25 ug via INTRAVENOUS
  Administered 2020-11-07: 50 ug via INTRAVENOUS

## 2020-11-07 MED ORDER — SODIUM CHLORIDE 0.9 % IR SOLN
Status: DC | PRN
  Administered 2020-11-07: 3000 mL

## 2020-11-07 MED ORDER — PROMETHAZINE HCL 25 MG/ML IJ SOLN: 6.2500 mg | INTRAMUSCULAR | Status: DC | PRN

## 2020-11-07 MED ORDER — DEXAMETHASONE SODIUM PHOSPHATE 10 MG/ML IJ SOLN
INTRAMUSCULAR | Status: DC | PRN
  Administered 2020-11-07: 4 mg via INTRAVENOUS

## 2020-11-07 MED ORDER — ONDANSETRON HCL 4 MG/2ML IJ SOLN
INTRAMUSCULAR | Status: DC | PRN
  Administered 2020-11-07: 4 mg via INTRAVENOUS

## 2020-11-07 MED ORDER — CHLORHEXIDINE GLUCONATE CLOTH 2 % EX PADS
6.0000 | MEDICATED_PAD | Freq: Every day | CUTANEOUS | Status: DC
  Administered 2020-11-07 – 2020-11-08 (×2): 6 via TOPICAL

## 2020-11-07 MED ORDER — SUCCINYLCHOLINE CHLORIDE 200 MG/10ML IV SOSY
PREFILLED_SYRINGE | INTRAVENOUS | Status: AC
  Filled 2020-11-07: qty 10

## 2020-11-07 MED ORDER — PHENYLEPHRINE HCL (PRESSORS) 10 MG/ML IV SOLN
INTRAVENOUS | Status: AC
  Filled 2020-11-07: qty 1

## 2020-11-07 MED ORDER — POTASSIUM CHLORIDE CRYS ER 20 MEQ PO TBCR
40.0000 meq | EXTENDED_RELEASE_TABLET | Freq: Two times a day (BID) | ORAL | Status: AC
  Administered 2020-11-07 (×2): 40 meq via ORAL
  Filled 2020-11-07 (×2): qty 4

## 2020-11-07 MED ORDER — PROPOFOL 10 MG/ML IV BOLUS
INTRAVENOUS | Status: DC | PRN
  Administered 2020-11-07: 120 mg via INTRAVENOUS

## 2020-11-07 MED ORDER — IOHEXOL 300 MG/ML  SOLN
INTRAMUSCULAR | Status: DC | PRN
  Administered 2020-11-07: 40 mL

## 2020-11-07 MED ORDER — PHENYLEPHRINE HCL-NACL 10-0.9 MG/250ML-% IV SOLN
INTRAVENOUS | Status: DC | PRN
  Administered 2020-11-07: 50 ug/min via INTRAVENOUS

## 2020-11-07 MED ORDER — FENTANYL CITRATE (PF) 100 MCG/2ML IJ SOLN: 25.0000 ug | INTRAMUSCULAR | Status: DC | PRN

## 2020-11-07 MED ORDER — SUCCINYLCHOLINE CHLORIDE 200 MG/10ML IV SOSY
PREFILLED_SYRINGE | INTRAVENOUS | Status: DC | PRN
  Administered 2020-11-07: 100 mg via INTRAVENOUS

## 2020-11-07 SURGICAL SUPPLY — 20 items
BAG DRAINAGE 600ML DEPOT (BAG) ×3 IMPLANT
BAG URINE DRAIN 2000ML AR STRL (UROLOGICAL SUPPLIES) IMPLANT
BAG URO CATCHER STRL LF (MISCELLANEOUS) ×3 IMPLANT
BASKET ZERO TIP NITINOL 2.4FR (BASKET) IMPLANT
CATH INTERMIT  6FR 70CM (CATHETERS) ×3 IMPLANT
CATH SILICONE 16FRX5CC (CATHETERS) ×3 IMPLANT
CLOTH BEACON ORANGE TIMEOUT ST (SAFETY) ×3 IMPLANT
GLOVE SURG ENC TEXT LTX SZ7.5 (GLOVE) ×3 IMPLANT
GOWN STRL REUS W/TWL XL LVL3 (GOWN DISPOSABLE) ×3 IMPLANT
GUIDEWIRE ANG ZIPWIRE 038X150 (WIRE) IMPLANT
GUIDEWIRE STR DUAL SENSOR (WIRE) ×3 IMPLANT
KIT TURNOVER KIT A (KITS) ×3 IMPLANT
LOOP CUT BIPOLAR 24F LRG (ELECTROSURGICAL) IMPLANT
MANIFOLD NEPTUNE II (INSTRUMENTS) ×3 IMPLANT
PACK CYSTO (CUSTOM PROCEDURE TRAY) ×3 IMPLANT
PENCIL SMOKE EVACUATOR (MISCELLANEOUS) IMPLANT
STENT URET 6FRX24 CONTOUR (STENTS) ×3 IMPLANT
STENT URET 6FRX26 CONTOUR (STENTS) ×3 IMPLANT
TUBING CONNECTING 10 (TUBING) ×3 IMPLANT
TUBING UROLOGY SET (TUBING) ×3 IMPLANT

## 2020-11-07 NOTE — Anesthesia Preprocedure Evaluation (Signed)
Anesthesia Evaluation  Patient identified by MRN, date of birth, ID band Patient awake    Reviewed: Allergy & Precautions, NPO status , Patient's Chart, lab work & pertinent test results  Airway Mallampati: II  TM Distance: >3 FB Neck ROM: Full    Dental  (+) Dental Advisory Given   Pulmonary neg pulmonary ROS,    breath sounds clear to auscultation       Cardiovascular hypertension, Pt. on medications  Rhythm:Regular Rate:Normal     Neuro/Psych CVA    GI/Hepatic negative GI ROS, Neg liver ROS,   Endo/Other  diabetes, Type 2, Insulin Dependent  Renal/GU ARFRenal disease     Musculoskeletal   Abdominal   Peds  Hematology  (+) anemia ,   Anesthesia Other Findings   Reproductive/Obstetrics                             Anesthesia Physical Anesthesia Plan  ASA: III  Anesthesia Plan: General   Post-op Pain Management:    Induction: Intravenous  PONV Risk Score and Plan: 3 and Ondansetron, Dexamethasone, Midazolam and Treatment may vary due to age or medical condition  Airway Management Planned: Oral ETT and LMA  Additional Equipment:   Intra-op Plan:   Post-operative Plan: Extubation in OR  Informed Consent: I have reviewed the patients History and Physical, chart, labs and discussed the procedure including the risks, benefits and alternatives for the proposed anesthesia with the patient or authorized representative who has indicated his/her understanding and acceptance.     Dental advisory given  Plan Discussed with: CRNA  Anesthesia Plan Comments:         Anesthesia Quick Evaluation

## 2020-11-07 NOTE — Anesthesia Postprocedure Evaluation (Signed)
Anesthesia Post Note  Patient: Jacqueline Leach  Procedure(s) Performed: exam under anesthesia (N/A Bladder) CYSTOSCOPY WITH RETROGRADE PYELOGRAM bilateral Wyvonnia Dusky STENT PLACEMENT bilateral (Bilateral Ureter)     Patient location during evaluation: PACU Anesthesia Type: General Level of consciousness: awake and alert Pain management: pain level controlled Vital Signs Assessment: post-procedure vital signs reviewed and stable Respiratory status: spontaneous breathing, nonlabored ventilation, respiratory function stable and patient connected to nasal cannula oxygen Cardiovascular status: blood pressure returned to baseline and stable Postop Assessment: no apparent nausea or vomiting Anesthetic complications: no   No complications documented.  Last Vitals:  Vitals:   11/07/20 1601 11/07/20 2004  BP: (!) 172/89 (!) 177/86  Pulse: (!) 103 (!) 101  Resp: 16 20  Temp: 36.8 C 36.8 C  SpO2: 97% 95%    Last Pain:  Vitals:   11/07/20 2004  TempSrc: Oral  PainSc:                  [name]

## 2020-11-07 NOTE — Consult Note (Signed)
Day of Surgery Subjective:  Pt without complaints this AM.   Objective: Vital signs in last 24 hours: Temp:  [98 F (36.7 C)-98.8 F (37.1 C)] 98.8 F (37.1 C) (02/26 0515) Pulse Rate:  [82-100] 100 (02/26 0515) Resp:  [15-29] 20 (02/26 0515) BP: (175-194)/(75-88) 190/84 (02/26 0515) SpO2:  [95 %-98 %] 96 % (02/26 0515)  Intake/Output from previous day: 02/25 0701 - 02/26 0700 In: 3813.3 [P.O.:240; I.V.:3573.3] Out: 2450 [Urine:2450] Intake/Output this shift: No intake/output data recorded.  Physical Exam:  NAD Alert and O in PACU (pre-op area)    Lab Results: Recent Labs    11/05/20 0100 11/06/20 0136 11/07/20 0134  HGB 8.9* 8.9* 9.7*  HCT 25.1* 26.0* 28.8*   BMET Recent Labs    11/06/20 0136 11/07/20 0134  NA 140 140  K 3.9 3.1*  CL 108 108  CO2 23 24  GLUCOSE 137* 180*  BUN 21 21  CREATININE 1.89* 1.98*  CALCIUM 8.3* 8.9   No results for input(s): LABPT, INR in the last 72 hours. No results for input(s): LABURIN in the last 72 hours. Results for orders placed or performed during the hospital encounter of 11/03/20  Urine culture     Status: Abnormal   Collection Time: 11/03/20  5:07 PM   Specimen: Urine, Random  Result Value Ref Range Status   Specimen Description URINE, RANDOM  Final   Special Requests ADDED 2226  Final   Culture (A)  Final    <10,000 COLONIES/mL INSIGNIFICANT GROWTH Performed at [facility], [address] [place], [place] [address]    Report Status 11/05/2020 FINAL  Final  SARS CORONAVIRUS 2 (TAT 6-24 HRS) Nasopharyngeal Nasopharyngeal Swab     Status: None   Collection Time: 11/03/20  7:14 PM   Specimen: Nasopharyngeal Swab  Result Value Ref Range Status   SARS Coronavirus 2 NEGATIVE NEGATIVE Final    Comment: (NOTE) SARS-CoV-2 target nucleic acids are NOT DETECTED.  The SARS-CoV-2 RNA is generally detectable in upper and lower respiratory specimens during the acute phase of infection. Negative results do not  preclude SARS-CoV-2 infection, do not rule out co-infections with other pathogens, and should not be used as the sole basis for treatment or other patient management decisions. Negative results must be combined with clinical observations, patient history, and epidemiological information. The expected result is Negative.  Fact Sheet for Patients: SugarRoll.be  Fact Sheet for Healthcare Providers: https://www.woods-mathews.com/  This test is not yet approved or cleared by the [place] FDA and  has been authorized for detection and/or diagnosis of SARS-CoV-2 by FDA under an Emergency Use Authorization (EUA). This EUA will remain  in effect (meaning this test can be used) for the duration of the COVID-19 declaration under Se ction 564(b)(1) of the Act, 21 U.S.C. section 360bbb-3(b)(1), unless the authorization is terminated or revoked sooner.  Performed at [facility], [place] [address] [place],  [address]   Surgical PCR screen     Status: None   Collection Time: 11/06/20 11:28 PM   Specimen: Nasal Mucosa; Nasal Swab  Result Value Ref Range Status   MRSA, PCR NEGATIVE NEGATIVE Final   Staphylococcus aureus NEGATIVE NEGATIVE Final    Comment: (NOTE) The Xpert SA Assay (FDA approved for NASAL specimens in patients 56 years of age and older), is one component of a comprehensive surveillance program. It is not intended to diagnose infection nor to guide or monitor treatment. Performed at [facility], [place]  [address] [place], [place] [address]     Studies/Results: US RENAL  Result Date: 11/05/2020 CLINICAL DATA:  Bladder mass. EXAM: RENAL / URINARY TRACT ULTRASOUND COMPLETE COMPARISON:  None. FINDINGS: Right Kidney: Renal measurements: 10.6 cm x 5.0 cm x 4.3 cm = volume: 118.78 mL. Echogenicity within normal limits. No mass or is visualized. There is moderate severity right-sided hydronephrosis. Left Kidney: Renal  measurements: 13.0 cm x 5.9 cm x 4.3 cm = volume: 169.16 mL. Echogenicity within normal limits. No mass is visualized. There is moderate severity left-sided hydronephrosis. Bladder: A heterogeneous thickened right-sided urinary bladder wall is noted. Other: None. IMPRESSION: 1. Moderate severity bilateral hydronephrosis. 2. Asymmetric heterogeneous urinary bladder wall thickening, as described above. Sequelae associated with an underlying neoplastic process cannot be excluded. Electronically Signed   By: Virgina Norfolk M.D.   On: 11/05/2020 19:59   DG CHEST PORT 1 VIEW  Result Date: 11/06/2020 CLINICAL DATA:  Preop exam. EXAM: PORTABLE CHEST 1 VIEW COMPARISON:  08/09/2019 FINDINGS: The heart size and mediastinal contours are within normal limits. Both lungs are clear. The visualized skeletal structures are unremarkable. IMPRESSION: No active disease. Electronically Signed   By: Nolon Nations M.D.   On: 11/06/2020 15:56    Assessment/Plan: Bladder mass - bilateral hydro - labs and urine cx reviewed. Pt stable for TURBT poss stents. Discussed she may need a staged procedure, restaging, etc depending on findings.    LOS: 3 days   Jacqueline Leach 11/07/2020, 9:31 AM

## 2020-11-07 NOTE — Anesthesia Procedure Notes (Signed)
Procedure Name: Intubation Date/Time: 11/07/2020 9:46 AM Performed by: Niel Hummer, CRNA Pre-anesthesia Checklist: Patient identified, Emergency Drugs available, Suction available and Patient being monitored Patient Re-evaluated:Patient Re-evaluated prior to induction Oxygen Delivery Method: Circle system utilized Preoxygenation: Pre-oxygenation with 100% oxygen Induction Type: IV induction and Rapid sequence Laryngoscope Size: Mac and 4 Grade View: Grade I Tube type: Oral Tube size: 7.0 mm Number of attempts: 1 Airway Equipment and Method: Stylet Placement Confirmation: ETT inserted through vocal cords under direct vision,  positive ETCO2 and breath sounds checked- equal and bilateral Secured at: 21 cm Tube secured with: Tape Dental Injury: Teeth and Oropharynx as per pre-operative assessment

## 2020-11-07 NOTE — Plan of Care (Signed)
  Problem: Health Behavior/Discharge Planning: Goal: Ability to manage health-related needs will improve Outcome: Progressing   Problem: Clinical Measurements: Goal: Ability to maintain clinical measurements within normal limits will improve Outcome: Progressing Goal: Will remain free from infection Outcome: Progressing Goal: Diagnostic test results will improve Outcome: Progressing Goal: Respiratory complications will improve Outcome: Progressing Goal: Cardiovascular complication will be avoided Outcome: Progressing   Problem: Activity: Goal: Risk for activity intolerance will decrease Outcome: Progressing   Problem: Nutrition: Goal: Adequate nutrition will be maintained Outcome: Progressing   Problem: Elimination: Goal: Will not experience complications related to bowel motility Outcome: Progressing Goal: Will not experience complications related to urinary retention Outcome: Progressing   Problem: Safety: Goal: Ability to remain free from injury will improve Outcome: Progressing   Problem: Education: Goal: Knowledge of disease and its progression will improve Outcome: Progressing   Problem: Health Behavior/Discharge Planning: Goal: Ability to manage health-related needs will improve Outcome: Progressing

## 2020-11-07 NOTE — Progress Notes (Signed)
PROGRESS NOTE    Jacqueline Leach  ZOX:[id number] DOB: [birth year] DOA: 11/03/2020 PCP: [facility]    Brief Narrative:  65 y.o. female with medical history significant of essential hypertension, hyperlipidemia, history of other nonhemorrhagic CVA, type 2 diabetes who is coming to the emergency department due to abdominal pain associated with decreased urination, fatigue, malaise, decreased appetite, nausea with occasional vomiting since last month.  She has been constipated for the last 2 weeks.  Denies melena or hematochezia.  Also has urinary frequency with occasional dysuria and tenesmus.  She was seen at [facility] on 10/29/2020 where she had a CT scan that showed a bladder tumor and constipation.  She is a scheduled to be seen by urology on 11/20/2020, but the patient states that her symptoms were worsening so she decided to come to the emergency department.  She denies fevers, but complains of chills and left-sided frontal headaches.  Denies sore throat, rhinorrhea, wheezing, hemoptysis, dyspnea, chest pain, palpitations, diaphoresis,, orthopnea, PND or pitting edema of the lower extremities.  No polyuria, polydipsia, polyphagia or blurred vision.  Assessment & Plan:   Principal Problem:   AKI (acute kidney injury) ([place]) Active Problems:   Essential hypertension   Hyperlipidemia   Type II diabetes mellitus, uncontrolled ([place])   Hypokalemia   Normocytic anemia   Hypoalbuminemia   Mild protein malnutrition (HCC)   ARF (acute renal failure) (HCC)   Preop examination  Principal Problem:   AKI (acute kidney injury) ([place]) Likely in the setting of bladder tumor/hydronephrosis. Continue IV fluids. Hold lisinopril. Avoid hypotension. Continue to avoid nephrotoxic medications. Cr slightly higher at 1.98 Renal US reviewed, findings confirming bladder mass with B mod-severe hydronephrosis Urology consulted, appreciate assistance, see below [place] in  AM  Bladder mass Pt is now s/p cystoscopy with stent placement 2/26 Per Urology, plan to allow renal function to stabilize and then get better contrasted pelvic image. Further eval can be done as outpatient once renal function recovers, per Urology  Active Problems:   Hypokalemia Low this AM, will replace Magnesium was supplemented at time of presentation Cont to follow lytes and replace as needed    Essential hypertension Holding lisinopril given ARF Continue amlodipine 10 mg p.o. daily. Currently suboptimally controlled Continue with PRN hydralazine    Hyperlipidemia Continue atorvastatin 40 mg p.o. daily.    Type II diabetes mellitus, uncontrolled (HCC) Carbohydrate modified diet. Cont with SSI coverage as needed Hgb a1c noted to be 5.5 Glucose overall stable this AM    Normocytic anemia Monitor hematocrit and hemoglobin. Transfuse as needed. Repeat cbc in AM    Hypoalbuminemia   Mild protein malnutrition (HCC) Mild generalized weakness. No muscle wasting noticed. Increase protein intake. Dietitian consulted  Nausea -Resolved -Advanced diet successfully  DVT prophylaxis: SCD's Code Status: Full Family Communication: Pt in room, family currently not at at bedside  Status is: Inpatient   Ongoing diagnostic testing needed not appropriate for outpatient work up and IV treatments appropriate due to intensity of illness or inability to take PO  Dispo: The patient is from: Home              Anticipated d/c is to: Home              Anticipated d/c date is: > 3 days              Patient currently is not medically stable to d/c.   Difficult to place patient No  Consultants:   Urology  Procedures:   Cystoscopy with stent placement 2/26  Antimicrobials: Anti-infectives (From admission, onward)   Start     Dose/Rate Route Frequency Ordered Stop   11/07/20 0730  [MAR Hold]  ciprofloxacin (CIPRO) IVPB 400 mg        (MAR Hold since Sat 11/07/2020 at  [place].Hold Reason: Transfer to a Procedural area.)   400 mg 200 mL/hr over 60 Minutes Intravenous  Once 11/06/20 1658 11/07/20 1001   11/04/20 1930  cefTRIAXone (ROCEPHIN) 1 g in sodium chloride 0.9 % 100 mL IVPB        1 g 200 mL/hr over 30 Minutes Intravenous Every 24 hours 11/03/20 2020 11/05/20 2116   11/03/20 1930  cefTRIAXone (ROCEPHIN) 1 g in sodium chloride 0.9 % 100 mL IVPB        1 g 200 mL/hr over 30 Minutes Intravenous  Once 11/03/20 1928 11/03/20 2029      Subjective: Without complaints. Denies nausea  Objective: Vitals:   11/06/20 2223 11/07/20 0000 11/07/20 0515 11/07/20 1045  BP: (!) 187/87 (!) 183/79 (!) 190/84 (!) 164/73  Pulse: 92 93 100 90  Resp: 20 18 20 17   Temp: 98.2 F (36.8 C)  98.8 F (37.1 C) (!) 97.4 F (36.3 C)  TempSrc: Oral  Oral   SpO2: 97% 98% 96% 100%  Weight:      Height:        Intake/Output Summary (Last 24 hours) at 11/07/2020 1114 Last data filed at 11/07/2020 1046 Gross per 24 hour  Intake 2381.82 ml  Output 2450 ml  Net -68.18 ml   Filed Weights   11/04/20 1248  Weight: 58.3 kg    Examination: General exam: Awake, laying in bed, in nad Respiratory system: Normal respiratory effort, no wheezing Cardiovascular system: regular rate, s1, s2 Gastrointestinal system: Soft, nondistended, positive BS Central nervous system: CN2-12 grossly intact, strength intact Extremities: Perfused, no clubbing Skin: Normal skin turgor, no notable skin lesions seen Psychiatry: Mood normal // no visual hallucinations   Data Reviewed: I have personally reviewed following labs and imaging studies  CBC: Recent Labs  Lab 11/03/20 1713 11/04/20 0242 11/05/20 0100 11/06/20 0136 11/07/20 0134  WBC 9.5 9.8 9.3 10.3 11.6*  HGB 11.1* 9.6* 8.9* 8.9* 9.7*  HCT 33.2* 27.4* 25.1* 26.0* 28.8*  MCV 87.8 87.0 85.4 87.2 85.7  PLT 210 185 169 184 989   Basic Metabolic Panel: Recent Labs  Lab 11/03/20 1713 11/03/20 1839 11/03/20 2130 11/04/20 0242  11/05/20 0100 11/06/20 0136 11/07/20 0134  NA 140  --   --  142 140 140 140  K 2.4*  --   --  2.7* 3.0* 3.9 3.1*  CL 104  --   --  107 108 108 108  CO2 26  --   --  23 22 23 24   GLUCOSE 172*  --   --  129* 114* 137* 180*  BUN 24*  --   --  22 16 21 21   CREATININE 1.95*  --   --  1.85* 1.65* 1.89* 1.98*  CALCIUM 9.2  --   --  8.6* 8.4* 8.3* 8.9  MG  --   --  1.7  --  1.7  --   --   PHOS  --  4.1 4.3  --   --   --   --    GFR: Estimated Creatinine Clearance: 22.4 mL/min (A) (by C-G formula based on SCr of 1.98 mg/dL (H)). Liver Function Tests: Recent Labs  Lab 11/03/20 1713  11/04/20 0242 11/05/20 0100 11/06/20 0136 11/07/20 0134  AST 16 13* <5* 16 16  ALT 15 12 10 11 11   ALKPHOS 73 62 60 69 81  BILITOT 0.3 0.3 0.3 0.3 0.5  PROT 6.9 5.9* 5.1* 5.2* 6.0*  ALBUMIN 3.4* 2.9* 2.5* 2.5* 2.9*   Recent Labs  Lab 11/03/20 1713  LIPASE 31   No results for input(s): AMMONIA in the last 168 hours. Coagulation Profile: No results for input(s): INR, PROTIME in the last 168 hours. Cardiac Enzymes: No results for input(s): CKTOTAL, CKMB, CKMBINDEX, TROPONINI in the last 168 hours. BNP (last 3 results) No results for input(s): PROBNP in the last 8760 hours. HbA1C: No results for input(s): HGBA1C in the last 72 hours. CBG: Recent Labs  Lab 11/06/20 0729 11/06/20 1146 11/06/20 1657 11/06/20 2257 11/07/20 1106  GLUCAP 134* 197* 111* 198* 180*   Lipid Profile: No results for input(s): CHOL, HDL, LDLCALC, TRIG, CHOLHDL, LDLDIRECT in the last 72 hours. Thyroid Function Tests: No results for input(s): TSH, T4TOTAL, FREET4, T3FREE, THYROIDAB in the last 72 hours. Anemia Panel: No results for input(s): VITAMINB12, FOLATE, FERRITIN, TIBC, IRON, RETICCTPCT in the last 72 hours. Sepsis Labs: No results for input(s): PROCALCITON, LATICACIDVEN in the last 168 hours.  Recent Results (from the past 240 hour(s))  Urine culture     Status: Abnormal   Collection Time: 11/03/20  5:07 PM    Specimen: Urine, Random  Result Value Ref Range Status   Specimen Description URINE, RANDOM  Final   Special Requests ADDED 2226  Final   Culture (A)  Final    <10,000 COLONIES/mL INSIGNIFICANT GROWTH Performed at [facility], [address], [place], [place] [address]    Report Status 11/05/2020 FINAL  Final  SARS CORONAVIRUS 2 (TAT 6-24 HRS) Nasopharyngeal Nasopharyngeal Swab     Status: None   Collection Time: 11/03/20  7:14 PM   Specimen: Nasopharyngeal Swab  Result Value Ref Range Status   SARS Coronavirus 2 NEGATIVE NEGATIVE Final    Comment: (NOTE) SARS-CoV-2 target nucleic acids are NOT DETECTED.  The SARS-CoV-2 RNA is generally detectable in upper and lower respiratory specimens during the acute phase of infection. Negative results do not preclude SARS-CoV-2 infection, do not rule out co-infections with other pathogens, and should not be used as the sole basis for treatment or other patient management decisions. Negative results must be combined with clinical observations, patient history, and epidemiological information. The expected result is Negative.  Fact Sheet for Patients: SugarRoll.be  Fact Sheet for Healthcare Providers: https://www.woods-mathews.com/  This test is not yet approved or cleared by the [place] FDA and  has been authorized for detection and/or diagnosis of SARS-CoV-2 by FDA under an Emergency Use Authorization (EUA). This EUA will remain  in effect (meaning this test can be used) for the duration of the COVID-19 declaration under Se ction 564(b)(1) of the Act, 21 U.S.C. section 360bbb-3(b)(1), unless the authorization is terminated or revoked sooner.  Performed at [facility], [place] [address] [place], [place] [address]   Surgical PCR screen     Status: None   Collection Time: 11/06/20 11:28 PM   Specimen: Nasal Mucosa; Nasal Swab  Result Value Ref Range Status   MRSA, PCR NEGATIVE  NEGATIVE Final   Staphylococcus aureus NEGATIVE NEGATIVE Final    Comment: (NOTE) The Xpert SA Assay (FDA approved for NASAL specimens in patients 8 years of age and older), is one component of a comprehensive surveillance program.  It is not intended to diagnose infection nor to guide or monitor treatment. Performed at [facility], [place] [address], [place], [place] [address]      Radiology Studies: US RENAL  Result Date: 11/05/2020 CLINICAL DATA:  Bladder mass. EXAM: RENAL / URINARY TRACT ULTRASOUND COMPLETE COMPARISON:  None. FINDINGS: Right Kidney: Renal measurements: 10.6 cm x 5.0 cm x 4.3 cm = volume: 118.78 mL. Echogenicity within normal limits. No mass or is visualized. There is moderate severity right-sided hydronephrosis. Left Kidney: Renal measurements: 13.0 cm x 5.9 cm x 4.3 cm = volume: 169.16 mL. Echogenicity within normal limits. No mass is visualized. There is moderate severity left-sided hydronephrosis. Bladder: A heterogeneous thickened right-sided urinary bladder wall is noted. Other: None. IMPRESSION: 1. Moderate severity bilateral hydronephrosis. 2. Asymmetric heterogeneous urinary bladder wall thickening, as described above. Sequelae associated with an underlying neoplastic process cannot be excluded. Electronically Signed   By: Virgina Norfolk M.D.   On: 11/05/2020 19:59   DG CHEST PORT 1 VIEW  Result Date: 11/06/2020 CLINICAL DATA:  Preop exam. EXAM: PORTABLE CHEST 1 VIEW COMPARISON:  08/09/2019 FINDINGS: The heart size and mediastinal contours are within normal limits. Both lungs are clear. The visualized skeletal structures are unremarkable. IMPRESSION: No active disease. Electronically Signed   By: Nolon Nations M.D.   On: 11/06/2020 15:56   DG C-Arm 1-60 Min-No Report  Result Date: 11/07/2020 Fluoroscopy was utilized by the requesting physician.  No radiographic interpretation.    Scheduled Meds: . [MAR Hold] amLODipine  5 mg Oral Daily  . [MAR Hold]  atorvastatin  40 mg Oral Daily  . [MAR Hold] feeding supplement  237 mL Oral BID BM  . [MAR Hold] insulin aspart  0-15 Units Subcutaneous TID WC  . [MAR Hold] mupirocin ointment  1 application Nasal BID  . [MAR Hold] pantoprazole  40 mg Oral Daily  . [MAR Hold] polyethylene glycol  17 g Oral Daily  . potassium chloride  40 mEq Oral BID  . [MAR Hold] senna-docusate  1 tablet Oral QHS  . [MAR Hold] sorbitol, milk of mag, mineral oil, glycerin (SMOG) enema  960 mL Rectal Once  . [MAR Hold] traZODone  100 mg Oral QHS   Continuous Infusions: . lactated ringers 100 mL/hr at 11/07/20 0325     LOS: 3 days   Marylu Lund, MD [facility] Pager On Amion  If 7PM-7AM, please contact night-coverage 11/07/2020, 11:14 AM

## 2020-11-07 NOTE — Progress Notes (Signed)
Pt transferred via [facility] to [facility] for surgical procedure. VS stable, Pt alert and oriented. Pt's daughter called and made aware that Pt has left for surgery.

## 2020-11-07 NOTE — Op Note (Addendum)
Preoperative diagnosis: Bladder mass, bilateral hydronephrosis, acute renal failure Postoperative diagnosis: Pelvic mass, bilateral hydronephrosis, acute renal failure  Procedure: Exam under anesthesia, cystoscopy bilateral retrograde pyelogram, bilateral ureteral stent placement  Surgeon: Junious Silk  Anesthesia: General  Indication for procedure: Ms. Pangilinan is a 65 year old female who was admitted with decreased urination, fatigue and malaise. Noncontrast CT scan at [facility] revealed posterior bladder wall thickening and bilateral hydronephrosis.  Findings: On exam under anesthesia there is a mass palpable posterior to the bladder midline to the right just anterior and lateral to the cervix. Cervix was not visualized but palpably normal. No bleeding per vagina.  On cystoscopy the urethra was unremarkable, the trigone and ureteral orifice ease were in their normal orthotopic position and appeared normal. Much of the posterior bladder wall especially to the right side above the trigone looked more heaped up but the mucosa appeared normal and no mucosal lesions. It palpably felt more like possible mass outside of the bladder and infiltrating the bladder. There was no specific lesion to resect. Given the palpable mass and the cystoscopic findings I thought it best to let her kidney function recover and get a better contrasted pelvic study-possibly CT or MRI-before blindly resecting into the bladder wall.  Left retrograde pyelogram-this outlined exam single ureter single collecting system unit with compression of the distal ureter from the mass and then a transition point to moderate to severe hydroureteronephrosis all the way up through the ureter renal pelvis and collecting system.  Right retrograde pyelogram-this outlined a single ureter single collecting system unit with compression of the mass of the distal ureter a centimeter or two longer area of compression than the left and then similar  transition point to moderate to severe hydronephrosis. There was also some tortuosity of the mid ureter which the zip wire straightened out. Compression of the right side seemed more severe.  Description of procedure: After consent was obtained patient brought to the operating room. After adequate anesthesia she was placed lithotomy position and prepped and draped in the usual sterile fashion. Timeout was performed to current the patient and procedure. Cystoscope was passed per urethra and the bladder carefully inspected. I then took a five [place] open-ended catheter and retrograde injection of contrast was performed in the left ureter. I cannot feel the ureter with contrast given the severe dilation. Therefore the wire was advanced into the proximal ureter and the open-ended catheter advanced up toward the left renal pelvis. The wire was removed. There was a brisk hydronephrotic drip and the urine looked clear. Retrograde injection of contrast confirmed dilated collecting system noting proper placement in the lumen. The wire was readvanced in the upper calyx. Open-ended catheter removed and a 626 cm stent advanced. Wire removed with a good coil in the kidney and a good coil in the bladder. Similar procedure was performed on the right side but I went with a 624 stent here and used a zip wire for ease of passage and straightening out the tortuous ureter. I again did exam with a finger in the vagina and cystoscopic look but given the mucosa appeared normal and there was no specific place to biopsy or begin resection. Stents had copious drainage. Therefore we will plan to allow her kidney function to recover and then get a better contrasted pelvic image. If she needs further evaluation this could be done as outpatient once her kidney function recovers.  Complications: None  Blood loss: Minimal  Specimens: None  Drains: Left 6 x 26 cm ureteral  stent, right 6 x 24 cm ureteral stent, 16 French Foley catheter  (nonlatex)  Disposition: Patient stable to PACU - I went over the procedure, postop care, follow-up with [name].  She will let [name] know.  She did say that her mom has had some abnormal Pap smears in the past.

## 2020-11-07 NOTE — Plan of Care (Signed)
  Problem: Clinical Measurements: Goal: Diagnostic test results will improve Outcome: Progressing Goal: Respiratory complications will improve Outcome: Progressing Goal: Cardiovascular complication will be avoided Outcome: Progressing   Problem: Activity: Goal: Risk for activity intolerance will decrease Outcome: Progressing   

## 2020-11-07 NOTE — Discharge Instructions (Signed)
Ureteral Stent Implantation, Care After This sheet gives you information about how to care for yourself after your procedure. Your health care provider may also give you more specific instructions. If you have problems or questions, contact your health care provider. What can I expect after the procedure? After the procedure, it is common to have:  Nausea.  Mild pain when you urinate. You may feel this pain in your lower back or lower abdomen. The pain should stop within a few minutes after you urinate. This may last for up to 1 week.  A small amount of blood in your urine for several days. Follow these instructions at home: Medicines  Take over-the-counter and prescription medicines only as told by your health care provider.  If you were prescribed an antibiotic medicine, take it as told by your health care provider. Do not stop taking the antibiotic even if you start to feel better.  Do not drive for 24 hours if you were given a sedative during your procedure.  Ask your health care provider if the medicine prescribed to you requires you to avoid driving or using heavy machinery. Activity  Rest as told by your health care provider.  Avoid sitting for a long time without moving. Get up to take short walks every 1-2 hours. This is important to improve blood flow and breathing. Ask for help if you feel weak or unsteady.  Return to your normal activities as told by your health care provider. Ask your health care provider what activities are safe for you. General instructions  Watch for any blood in your urine. Call your health care provider if the amount of blood in your urine increases.  If you have a catheter: ? Follow instructions from your health care provider about taking care of your catheter and collection bag. ? Do not take baths, swim, or use a hot tub until your health care provider approves. Ask your health care provider if you may take showers. You may only be allowed to  take sponge baths.  Drink enough fluid to keep your urine pale yellow.  Do not use any products that contain nicotine or tobacco, such as cigarettes, e-cigarettes, and chewing tobacco. These can delay healing after surgery. If you need help quitting, ask your health care provider.  Keep all follow-up visits as told by your health care provider. This is important.   Contact a health care provider if:  You have pain that gets worse or does not get better with medicine, especially pain when you urinate.  You have difficulty urinating.  You feel nauseous or you vomit repeatedly during a period of more than 2 days after the procedure. Get help right away if:  Your urine is dark red or has blood clots in it.  You are leaking urine (have incontinence).  The end of the stent comes out of your urethra.  You cannot urinate.  You have sudden, sharp, or severe pain in your abdomen or lower back.  You have a fever.  You have swelling or pain in your legs.  You have difficulty breathing. Summary  After the procedure, it is common to have mild pain when you urinate that goes away within a few minutes after you urinate. This may last for up to 1 week.  Watch for any blood in your urine. Call your health care provider if the amount of blood in your urine increases.  Take over-the-counter and prescription medicines only as told by your health care provider.  Drink enough fluid to keep your urine pale yellow. This information is not intended to replace advice given to you by your health care provider. Make sure you discuss any questions you have with your health care provider. Document Revised: 06/05/2018 Document Reviewed: 06/06/2018 Elsevier Patient Education  2021 Elsevier Inc.  

## 2020-11-07 NOTE — Progress Notes (Signed)
Pt given Soap Suds Enema per orders. Moderate amount of soft brown stool resulted from the enema. Pt tolerated Enema without problems

## 2020-11-07 NOTE — Transfer of Care (Signed)
Immediate Anesthesia Transfer of Care Note  Patient: Jacqueline Leach  Procedure(s) Performed: exam under anesthesia (N/A Bladder) CYSTOSCOPY WITH RETROGRADE PYELOGRAM bilateral Wyvonnia Dusky STENT PLACEMENT bilateral (Bilateral Ureter)  Patient Location: PACU  Anesthesia Type:General  Level of Consciousness: awake, alert  and oriented  Airway & Oxygen Therapy: Patient Spontanous Breathing and Patient connected to face mask oxygen  Post-op Assessment: Report given to RN, Post -op Vital signs reviewed and stable and Patient moving all extremities X 4  Post vital signs: Reviewed and stable  Last Vitals:  Vitals Value Taken Time  BP    Temp    Pulse    Resp    SpO2      Last Pain:  Vitals:   11/07/20 0515  TempSrc: Oral  PainSc:       Patients Stated Pain Goal: 1 (23/46/88 7373)  Complications: No complications documented.

## 2020-11-08 ENCOUNTER — Encounter (HOSPITAL_COMMUNITY): Payer: Self-pay | Admitting: Urology

## 2020-11-08 DIAGNOSIS — I10 Essential (primary) hypertension: Secondary | ICD-10-CM | POA: Diagnosis not present

## 2020-11-08 DIAGNOSIS — N179 Acute kidney failure, unspecified: Secondary | ICD-10-CM | POA: Diagnosis not present

## 2020-11-08 LAB — COMPREHENSIVE METABOLIC PANEL
ALT: 12 U/L (ref 0–44)
AST: 16 U/L (ref 15–41)
Albumin: 3 g/dL — ABNORMAL LOW (ref 3.5–5.0)
Alkaline Phosphatase: 64 U/L (ref 38–126)
Anion gap: 10 (ref 5–15)
BUN: 24 mg/dL — ABNORMAL HIGH (ref 8–23)
CO2: 25 mmol/L (ref 22–32)
Calcium: 8.8 mg/dL — ABNORMAL LOW (ref 8.9–10.3)
Chloride: 107 mmol/L (ref 98–111)
Creatinine, Ser: 1.34 mg/dL — ABNORMAL HIGH (ref 0.44–1.00)
GFR, Estimated: 44 mL/min — ABNORMAL LOW (ref >60)
Glucose, Bld: 129 mg/dL — ABNORMAL HIGH (ref 70–99)
Potassium: 3.7 mmol/L (ref 3.5–5.1)
Sodium: 142 mmol/L (ref 135–145)
Total Bilirubin: 0.4 mg/dL (ref 0.3–1.2)
Total Protein: 6.1 g/dL — ABNORMAL LOW (ref 6.5–8.1)

## 2020-11-08 LAB — GLUCOSE, CAPILLARY
Glucose-Capillary: 124 mg/dL — ABNORMAL HIGH (ref 70–99)
Glucose-Capillary: 165 mg/dL — ABNORMAL HIGH (ref 70–99)
Glucose-Capillary: 146 mg/dL — ABNORMAL HIGH (ref 70–99)
Glucose-Capillary: 155 mg/dL — ABNORMAL HIGH (ref 70–99)

## 2020-11-08 MED ORDER — METOPROLOL TARTRATE 25 MG PO TABS
25.0000 mg | ORAL_TABLET | Freq: Two times a day (BID) | ORAL | Status: DC
  Administered 2020-11-08 – 2020-11-09 (×3): 25 mg via ORAL
  Filled 2020-11-08 (×3): qty 1

## 2020-11-08 MED ORDER — AMLODIPINE BESYLATE 10 MG PO TABS
10.0000 mg | ORAL_TABLET | Freq: Every day | ORAL | Status: DC
  Administered 2020-11-08 – 2020-11-09 (×2): 10 mg via ORAL
  Filled 2020-11-08 (×2): qty 1

## 2020-11-08 NOTE — Plan of Care (Signed)

## 2020-11-08 NOTE — Progress Notes (Addendum)
  S:  1) Pelvic mass - non-contrast CT at [facility] 10/29/2020 with right posterior bladder wall thickening vs mass and bilateral hydronephrosis. Cystoscopy 11/08/2020 with no bladder mass/mucosal lesion - mass posterior to bladder vs infiltrative mass. Needs contrast MRI or CT.   2) bilateral hydronephrosis - from pelvic mass. Cr 1.95 from baseline 0.5. S/p bilateral ureteral stent 11/09/2019. Good diuresis. Cr 2/27 1.34.     Today, [place] is seen for the above. Having crampy LLQ/SP pain. Foley still in place - good diuresis   Intake/Output Summary (Last 24 hours) at 11/08/2020 1455 Last data filed at 11/08/2020 1000 Gross per 24 hour  Intake 240 ml  Output 2900 ml  Net -2660 ml   NAD Watching TV Abd - soft, NT   A/P:  1) pelvic mass - hopeful Cr improves again tomorrow and she can get a contrasted scan to guide outpatient follow-up.   2) bilateral hydronephrosis - s/p bilateral stents. Good UOP and Cr improving. OK to d/c follow with hope that will improve some of her pelvic discomfort.

## 2020-11-08 NOTE — Progress Notes (Signed)
PROGRESS NOTE    Jacqueline Leach  ZSW:[id number] DOB: 1956/06/30 DOA: 11/03/2020 [facility]CP: [facility]    Brief Narrative:  65 y.o. female with medical history significant of essential hypertension, hyperlipidemia, history of other nonhemorrhagic CVA, type 2 diabetes who is coming to the emergency department due to abdominal pain associated with decreased urination, fatigue, malaise, decreased appetite, nausea with occasional vomiting since last month.  She has been constipated for the last 2 weeks.  Denies melena or hematochezia.  Also has urinary frequency with occasional dysuria and tenesmus.  She was seen at [facility] on 10/29/2020 where she had a CT scan that showed a bladder tumor and constipation.  She is a scheduled to be seen by urology on 11/20/2020, but the patient states that her symptoms were worsening so she decided to come to the emergency department.  She denies fevers, but complains of chills and left-sided frontal headaches.  Denies sore throat, rhinorrhea, wheezing, hemoptysis, dyspnea, chest pain, palpitations, diaphoresis,, orthopnea, PND or pitting edema of the lower extremities.  No polyuria, polydipsia, polyphagia or blurred vision.  Assessment & Plan:   Principal Problem:   AKI (acute kidney injury) ([place]) Active Problems:   Essential hypertension   Hyperlipidemia   Type II diabetes mellitus, uncontrolled ([place])   Hypokalemia   Normocytic anemia   Hypoalbuminemia   Mild protein malnutrition (HCC)   ARF (acute renal failure) (HCC)   Preop examination  Principal Problem:   AKI (acute kidney injury) ([place]) Likely in the setting of bladder tumor/hydronephrosis. Continue IV fluids. Hold lisinopril. Avoid hypotension. Continue to avoid nephrotoxic medications. Renal US reviewed, findings confirming bladder mass with B mod-severe hydronephrosis Urology consulted, appreciate assistance, see below Renal function now improving post-stent placement. Cr  1.34. Foley in place  Bladder mass Pt is now s/p cystoscopy with stent placement 2/26 Per Urology, plan to allow renal function to stabilize and then get better contrasted pelvic image. Further eval can be done as outpatient once renal function recovers, per Urology Pt remains with foley cath  Active Problems:   Hypokalemia Replaced Repeat bmet in AM    Essential hypertension Holding lisinopril given ARF Suboptimally controlled Continue amlodipine 10 mg p.o. daily. Have added metoprolol Continue with PRN hydralazine    Hyperlipidemia Continue atorvastatin 40 mg p.o. daily.    Type II diabetes mellitus, uncontrolled (HCC) Carbohydrate modified diet. Cont with SSI coverage as needed Hgb a1c noted to be 5.5 Glucose overall stable this AM    Normocytic anemia Monitor hematocrit and hemoglobin. Transfuse as needed. Repeat cbc in AM    Hypoalbuminemia   Mild protein malnutrition (HCC) Mild generalized weakness. No muscle wasting noticed. Increase protein intake. Dietitian consulted  Nausea -Resolved -Advanced diet successfully  DVT prophylaxis: SCD's Code Status: Full Family Communication: Pt in room, family currently not at at bedside  Status is: Inpatient   Ongoing diagnostic testing needed not appropriate for outpatient work up and IV treatments appropriate due to intensity of illness or inability to take PO  Dispo: The patient is from: Home              Anticipated d/c is to: Home              Anticipated d/c date is: > 3 days              Patient currently is not medically stable to d/c.   Difficult to place patient No  Consultants:   Urology  Procedures:   Cystoscopy with  stent placement 2/26  Antimicrobials: Anti-infectives (From admission, onward)   Start     Dose/Rate Route Frequency Ordered Stop   11/07/20 0730  ciprofloxacin (CIPRO) IVPB 400 mg        400 mg 200 mL/hr over 60 Minutes Intravenous  Once 11/06/20 1658 11/07/20 1001    11/04/20 1930  cefTRIAXone (ROCEPHIN) 1 g in sodium chloride 0.9 % 100 mL IVPB        1 g 200 mL/hr over 30 Minutes Intravenous Every 24 hours 11/03/20 2020 11/05/20 2116   11/03/20 1930  cefTRIAXone (ROCEPHIN) 1 g in sodium chloride 0.9 % 100 mL IVPB        1 g 200 mL/hr over 30 Minutes Intravenous  Once 11/03/20 1928 11/03/20 2029      Subjective: Reports feeling better today  Objective: Vitals:   11/08/20 0627 11/08/20 1028 11/08/20 1031 11/08/20 1205  BP: (!) 173/77 (!) 166/81  (!) 179/75  Pulse: 96  88 84  Resp: 20   (!) 22  Temp: 98.5 F (36.9 C)   (!) 97.5 F (36.4 C)  TempSrc: Oral   Oral  SpO2: 97%   97%  Weight:      Height:        Intake/Output Summary (Last 24 hours) at 11/08/2020 1211 Last data filed at 11/08/2020 1000 Gross per 24 hour  Intake 480 ml  Output 3775 ml  Net -3295 ml   Filed Weights   11/04/20 1248  Weight: 58.3 kg    Examination: General exam: Conversant, in no acute distress Respiratory system: normal chest rise, clear, no audible wheezing Cardiovascular system: regular rhythm, s1-s2 Gastrointestinal system: Nondistended, nontender, pos BS Central nervous system: No seizures, no tremors Extremities: No cyanosis, no joint deformities Skin: No rashes, no pallor Psychiatry: Affect normal // no auditory hallucinations   Data Reviewed: I have personally reviewed following labs and imaging studies  CBC: Recent Labs  Lab 11/03/20 1713 11/04/20 0242 11/05/20 0100 11/06/20 0136 11/07/20 0134  WBC 9.5 9.8 9.3 10.3 11.6*  HGB 11.1* 9.6* 8.9* 8.9* 9.7*  HCT 33.2* 27.4* 25.1* 26.0* 28.8*  MCV 87.8 87.0 85.4 87.2 85.7  PLT 210 185 169 184 517   Basic Metabolic Panel: Recent Labs  Lab 11/03/20 1839 11/03/20 2130 11/04/20 0242 11/05/20 0100 11/06/20 0136 11/07/20 0134 11/08/20 0509  NA  --   --  142 140 140 140 142  K  --   --  2.7* 3.0* 3.9 3.1* 3.7  CL  --   --  107 108 108 108 107  CO2  --   --  23 22 23 24 25   GLUCOSE  --    --  129* 114* 137* 180* 129*  BUN  --   --  22 16 21 21  24*  CREATININE  --   --  1.85* 1.65* 1.89* 1.98* 1.34*  CALCIUM  --   --  8.6* 8.4* 8.3* 8.9 8.8*  MG  --  1.7  --  1.7  --   --   --   PHOS 4.1 4.3  --   --   --   --   --    GFR: Estimated Creatinine Clearance: 33.1 mL/min (A) (by C-G formula based on SCr of 1.34 mg/dL (H)). Liver Function Tests: Recent Labs  Lab 11/04/20 0242 11/05/20 0100 11/06/20 0136 11/07/20 0134 11/08/20 0509  AST 13* <5* 16 16 16   ALT 12 10 11 11 12   ALKPHOS 62 60 69 81 64  BILITOT 0.3 0.3 0.3 0.5 0.4  PROT 5.9* 5.1* 5.2* 6.0* 6.1*  ALBUMIN 2.9* 2.5* 2.5* 2.9* 3.0*   Recent Labs  Lab 11/03/20 1713  LIPASE 31   No results for input(s): AMMONIA in the last 168 hours. Coagulation Profile: No results for input(s): INR, PROTIME in the last 168 hours. Cardiac Enzymes: No results for input(s): CKTOTAL, CKMB, CKMBINDEX, TROPONINI in the last 168 hours. BNP (last 3 results) No results for input(s): PROBNP in the last 8760 hours. HbA1C: No results for input(s): HGBA1C in the last 72 hours. CBG: Recent Labs  Lab 11/07/20 1251 11/07/20 1558 11/07/20 2001 11/08/20 0746 11/08/20 1149  GLUCAP 161* 290* 200* 124* 165*   Lipid Profile: No results for input(s): CHOL, HDL, LDLCALC, TRIG, CHOLHDL, LDLDIRECT in the last 72 hours. Thyroid Function Tests: No results for input(s): TSH, T4TOTAL, FREET4, T3FREE, THYROIDAB in the last 72 hours. Anemia Panel: No results for input(s): VITAMINB12, FOLATE, FERRITIN, TIBC, IRON, RETICCTPCT in the last 72 hours. Sepsis Labs: No results for input(s): PROCALCITON, LATICACIDVEN in the last 168 hours.  Recent Results (from the past 240 hour(s))  Urine culture     Status: Abnormal   Collection Time: 11/03/20  5:07 PM   Specimen: Urine, Random  Result Value Ref Range Status   Specimen Description URINE, RANDOM  Final   Special Requests ADDED 2226  Final   Culture (A)  Final    <10,000 COLONIES/mL  INSIGNIFICANT GROWTH Performed at [facility], [address], [place], [place] [address]    Report Status 11/05/2020 FINAL  Final  SARS CORONAVIRUS 2 (TAT 6-24 HRS) Nasopharyngeal Nasopharyngeal Swab     Status: None   Collection Time: 11/03/20  7:14 PM   Specimen: Nasopharyngeal Swab  Result Value Ref Range Status   SARS Coronavirus 2 NEGATIVE NEGATIVE Final    Comment: (NOTE) SARS-CoV-2 target nucleic acids are NOT DETECTED.  The SARS-CoV-2 RNA is generally detectable in upper and lower respiratory specimens during the acute phase of infection. Negative results do not preclude SARS-CoV-2 infection, do not rule out co-infections with other pathogens, and should not be used as the sole basis for treatment or other patient management decisions. Negative results must be combined with clinical observations, patient history, and epidemiological information. The expected result is Negative.  Fact Sheet for Patients: SugarRoll.be  Fact Sheet for Healthcare Providers: https://www.woods-mathews.com/  This test is not yet approved or cleared by the [place] FDA and  has been authorized for detection and/or diagnosis of SARS-CoV-2 by FDA under an Emergency Use Authorization (EUA). This EUA will remain  in effect (meaning this test can be used) for the duration of the COVID-19 declaration under Se ction 564(b)(1) of the Act, 21 U.S.C. section 360bbb-3(b)(1), unless the authorization is terminated or revoked sooner.  Performed at [facility], [place] [address] [place], [place] [address]   Surgical PCR screen     Status: None   Collection Time: 11/06/20 11:28 PM   Specimen: Nasal Mucosa; Nasal Swab  Result Value Ref Range Status   MRSA, PCR NEGATIVE NEGATIVE Final   Staphylococcus aureus NEGATIVE NEGATIVE Final    Comment: (NOTE) The Xpert SA Assay (FDA approved for NASAL specimens in patients 30 years of age and older), is  one component of a comprehensive surveillance program. It is not intended to diagnose infection nor to guide or monitor treatment. Performed at [facility], [place] [address], [place], [place] [address]  Radiology Studies: DG CHEST PORT 1 VIEW  Result Date: 11/06/2020 CLINICAL DATA:  Preop exam. EXAM: PORTABLE CHEST 1 VIEW COMPARISON:  08/09/2019 FINDINGS: The heart size and mediastinal contours are within normal limits. Both lungs are clear. The visualized skeletal structures are unremarkable. IMPRESSION: No active disease. Electronically Signed   By: Nolon Nations M.D.   On: 11/06/2020 15:56   DG C-Arm 1-60 Min-No Report  Result Date: 11/07/2020 Fluoroscopy was utilized by the requesting physician.  No radiographic interpretation.    Scheduled Meds: . amLODipine  10 mg Oral Daily  . atorvastatin  40 mg Oral Daily  . Chlorhexidine Gluconate Cloth  6 each Topical Daily  . feeding supplement  237 mL Oral BID BM  . insulin aspart  0-15 Units Subcutaneous TID WC  . metoprolol tartrate  25 mg Oral BID  . mupirocin ointment  1 application Nasal BID  . pantoprazole  40 mg Oral Daily  . polyethylene glycol  17 g Oral Daily  . senna-docusate  1 tablet Oral QHS  . sorbitol, milk of mag, mineral oil, glycerin (SMOG) enema  960 mL Rectal Once  . traZODone  100 mg Oral QHS   Continuous Infusions: . lactated ringers 100 mL/hr at 11/08/20 1034     LOS: 4 days   Marylu Lund, MD [facility] Pager On Amion  If 7PM-7AM, please contact night-coverage 11/08/2020, 12:11 PM

## 2020-11-09 ENCOUNTER — Inpatient Hospital Stay (HOSPITAL_COMMUNITY): Payer: Medicare HMO

## 2020-11-09 DIAGNOSIS — I10 Essential (primary) hypertension: Secondary | ICD-10-CM | POA: Diagnosis not present

## 2020-11-09 DIAGNOSIS — N179 Acute kidney failure, unspecified: Secondary | ICD-10-CM | POA: Diagnosis not present

## 2020-11-09 LAB — CBC
HCT: 29.7 % — ABNORMAL LOW (ref 36.0–46.0)
Hemoglobin: 9.8 g/dL — ABNORMAL LOW (ref 12.0–15.0)
MCH: 29.6 pg (ref 26.0–34.0)
MCHC: 33 g/dL (ref 30.0–36.0)
MCV: 89.7 fL (ref 80.0–100.0)
Platelets: 217 10*3/uL (ref 150–400)
RBC: 3.31 MIL/uL — ABNORMAL LOW (ref 3.87–5.11)
RDW: 13.5 % (ref 11.5–15.5)
WBC: 10.5 10*3/uL (ref 4.0–10.5)
nRBC: 0 % (ref 0.0–0.2)

## 2020-11-09 LAB — COMPREHENSIVE METABOLIC PANEL
ALT: 13 U/L (ref 0–44)
AST: 16 U/L (ref 15–41)
Albumin: 3.1 g/dL — ABNORMAL LOW (ref 3.5–5.0)
Alkaline Phosphatase: 60 U/L (ref 38–126)
Anion gap: 11 (ref 5–15)
BUN: 19 mg/dL (ref 8–23)
CO2: 25 mmol/L (ref 22–32)
Calcium: 8.6 mg/dL — ABNORMAL LOW (ref 8.9–10.3)
Chloride: 102 mmol/L (ref 98–111)
Creatinine, Ser: 1.04 mg/dL — ABNORMAL HIGH (ref 0.44–1.00)
GFR, Estimated: 60 mL/min — ABNORMAL LOW (ref >60)
Glucose, Bld: 140 mg/dL — ABNORMAL HIGH (ref 70–99)
Potassium: 3 mmol/L — ABNORMAL LOW (ref 3.5–5.1)
Sodium: 138 mmol/L (ref 135–145)
Total Bilirubin: 0.5 mg/dL (ref 0.3–1.2)
Total Protein: 6.1 g/dL — ABNORMAL LOW (ref 6.5–8.1)

## 2020-11-09 LAB — GLUCOSE, CAPILLARY
Glucose-Capillary: 133 mg/dL — ABNORMAL HIGH (ref 70–99)
Glucose-Capillary: 136 mg/dL — ABNORMAL HIGH (ref 70–99)

## 2020-11-09 IMAGING — MR MR PELVIS WO/W CM
21 series · 48 of 48 positions shown · IV contrast (gadavist)
Comparison: CT urogram [DATE]. CT abdomen/pelvis CT with
contrast [DATE].

CLINICAL DATA: Bladder wall thickening on previous CT scan.

EXAM:
MRI PELVIS WITHOUT AND WITH CONTRAST
TECHNIQUE: Multiplanar multisequence MR imaging of the pelvis was performed
both before and after administration of intravenous contrast.
CONTRAST:  5mL GADAVIST GADOBUTROL 1 MMOL/ML IV SOLN

[Series 2: T2 · coronal · 6.0mm · 1.56mm/px · 1 of 30 slices shown (1 of 3)]
[im 1/30]
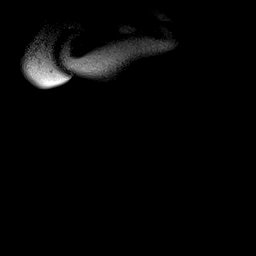

[Series 3: T2 · axial · 5.0mm · 0.51mm/px · 1 of 34 slices shown (2 of 3)]
[im 1/34]
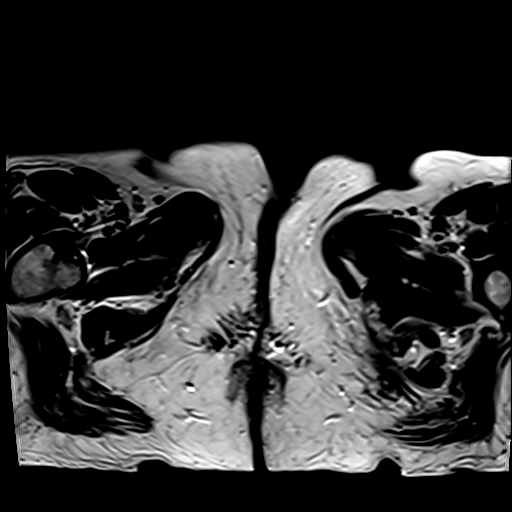

[Series 4: T2 fat-sat · axial · 5.0mm · 0.51mm/px · 1 of 34 slices shown]
[im 1/34]
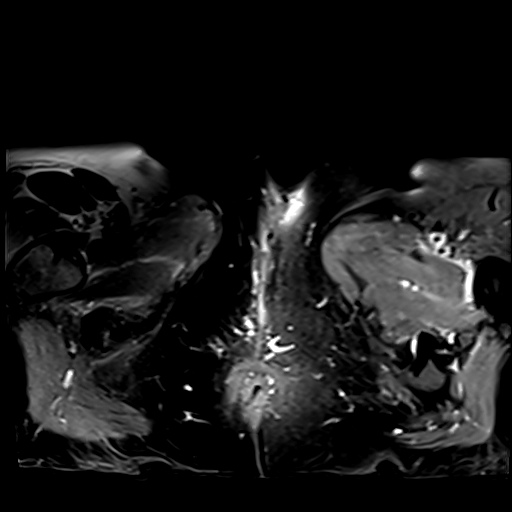

[Series 5: T2 · sagittal · 5.0mm · 0.55mm/px · 1 of 32 slices shown (3 of 3)]
[im 1/32]
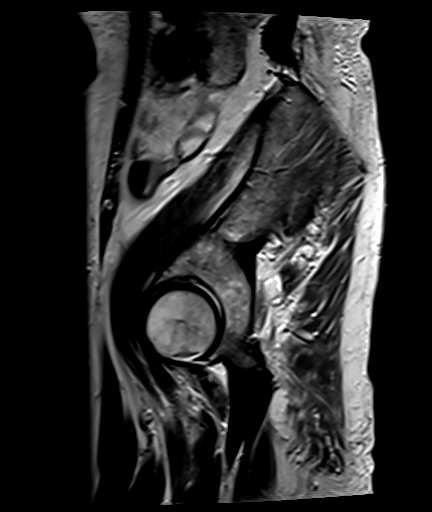

[Series 6: T1 · axial · 4.4mm · 0.84mm/px · z∈[-29,+213]mm · 2 of 56 slices shown (1 of 2)]
[im 1/56]
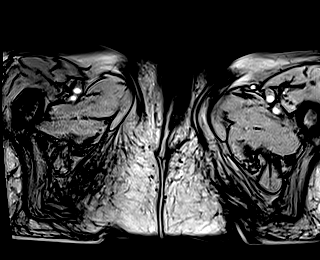
[im 56/56]
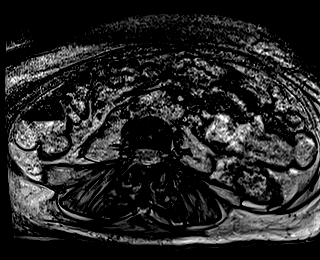

[Series 7: T1 · axial · 4.4mm · 0.84mm/px · z∈[-29,+213]mm · 2 of 56 slices shown (2 of 2)]
[im 1/56]
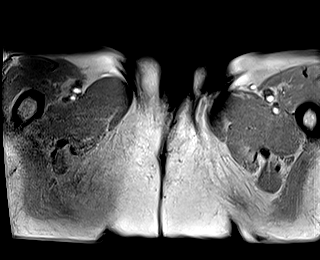
[im 56/56]
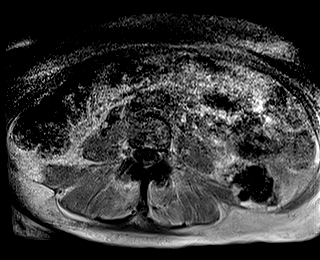

[Series 8: DWI · axial · 5.0mm · 2.80mm/px · z∈[+12,+157]mm · 4 of 84 slices shown (1 of 3)]
[im 1/84]
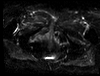
[im 28/84]
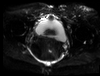
[im 56/84]
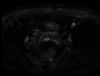
[im 84/84]
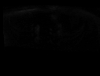

[Series 9: DWI · axial · 5.0mm · 2.80mm/px · 1 of 30 slices shown (2 of 3)]
[im 1/30]
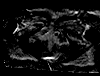

[Series 10: DWI · axial · 5.0mm · 2.80mm/px · 1 of 30 slices shown (3 of 3)]
[im 1/30]
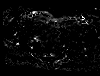

[Series 12: T1 dynamic · axial · 3.0mm · 0.84mm/px · z∈[-2,+187]mm · 3 of 64 slices shown (1 of 5)]
[im 1/64]
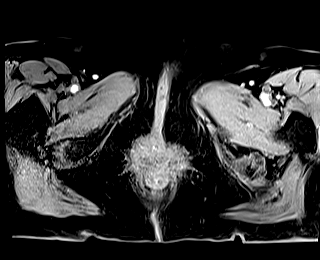
[im 32/64]
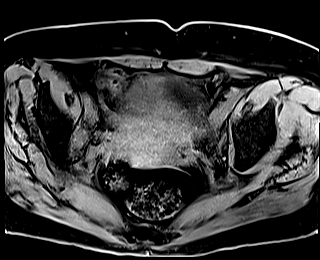
[im 64/64]
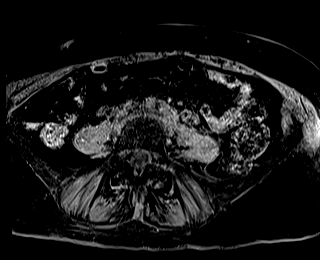

[Series 15: T1 dynamic · axial · 3.0mm · 0.84mm/px · z∈[-2,+187]mm · 3 of 64 slices shown (2 of 5)]
[im 1/64]
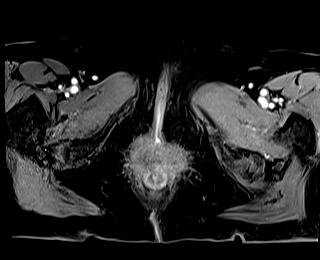
[im 32/64]
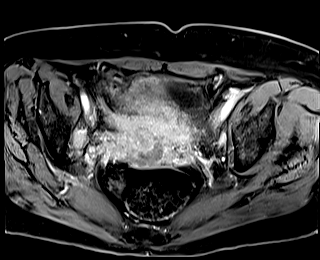
[im 64/64]
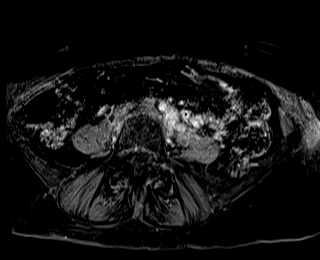

[Series 17: T1 dynamic · axial · 3.0mm · 0.84mm/px · z∈[-2,+187]mm · 3 of 64 slices shown (3 of 5)]
[im 1/64]
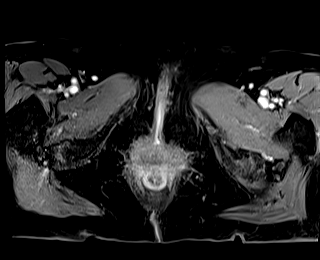
[im 32/64]
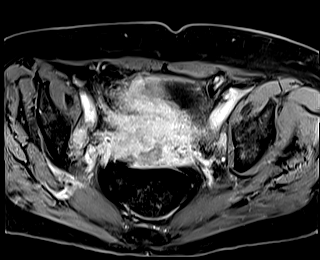
[im 64/64]
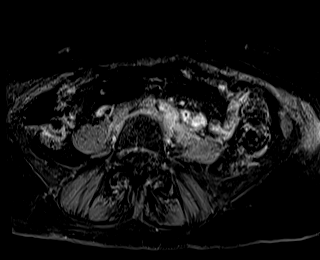

[Series 19: T1 dynamic · axial · 3.0mm · 0.84mm/px · z∈[-2,+187]mm · 3 of 64 slices shown (4 of 5)]
[im 1/64]
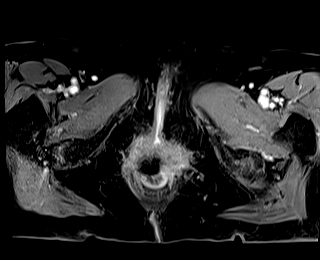
[im 32/64]
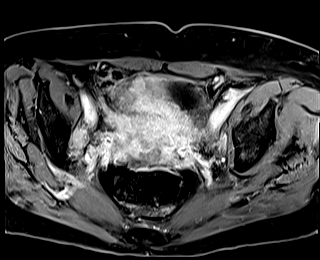
[im 64/64]
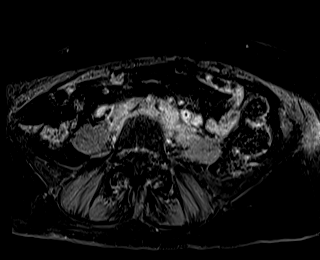

[Series 21: T1 dynamic post-contrast · axial · 3.0mm · 1.06mm/px · z∈[-27,+185]mm · 3 of 72 slices shown (1 of 2)]
[im 1/72]
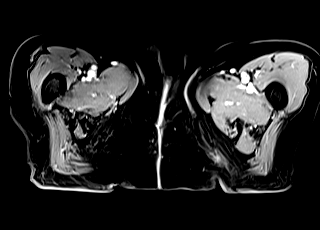
[im 36/72]
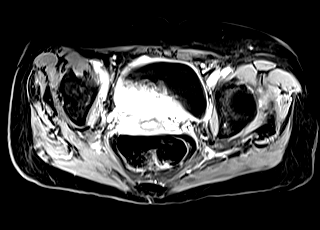
[im 72/72]
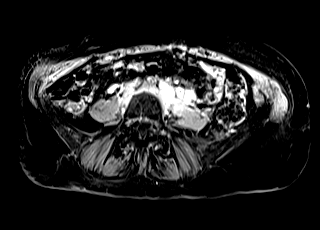

[Series 23: T1 dynamic post-contrast · sagittal · 3.0mm · 0.88mm/px · 3 of 72 slices shown (2 of 2)]
[im 1/72]
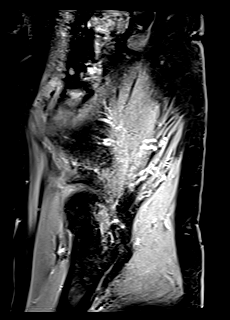
[im 36/72]
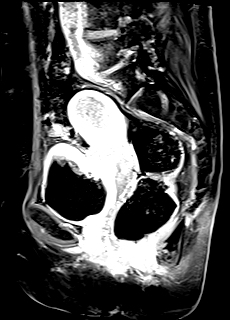
[im 72/72]
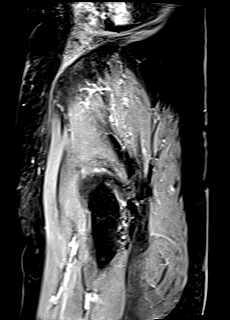

[Series 24: T2 post-contrast · sagittal · 5.0mm · 0.55mm/px · 1 of 32 slices shown]
[im 1/32]
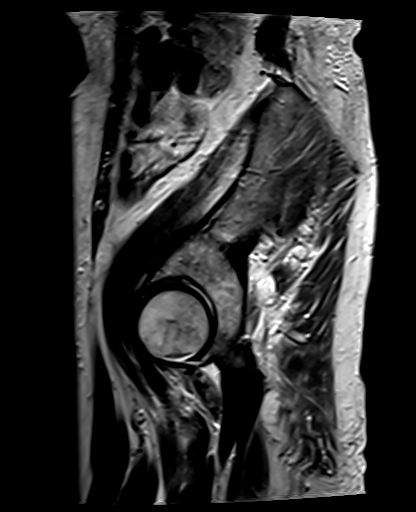

[Series 26: T1 dynamic · axial · 3.0mm · 0.84mm/px · z∈[-2,+187]mm · 3 of 64 slices shown (5 of 5)]
[im 1/64]
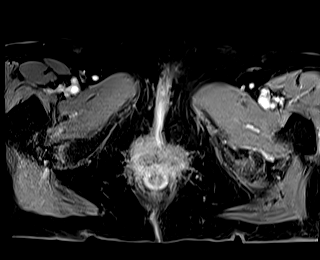
[im 32/64]
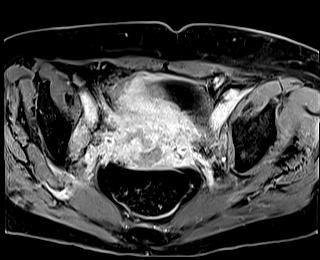
[im 64/64]
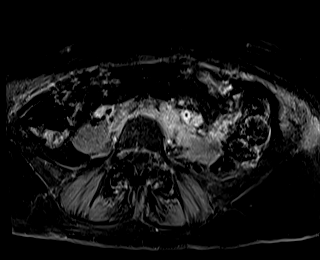

[Series 100: sub_1min sub · axial · 3.0mm · 0.84mm/px · z∈[-2,+187]mm · 3 of 64 slices shown]
[im 1/64]
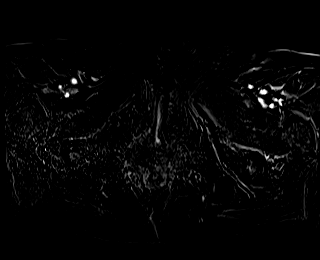
[im 32/64]
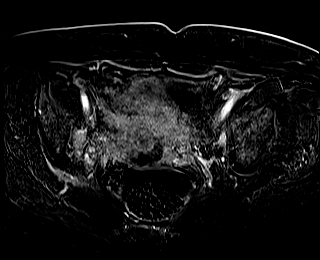
[im 64/64]
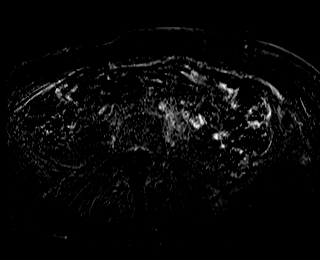

[Series 101: sub_2 min sub · axial · 3.0mm · 0.84mm/px · z∈[-2,+187]mm · 3 of 64 slices shown]
[im 1/64]
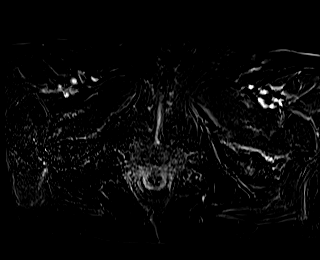
[im 32/64]
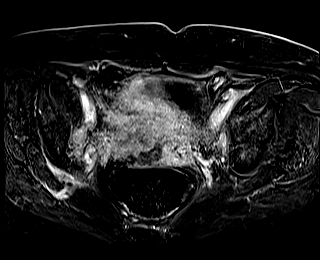
[im 64/64]
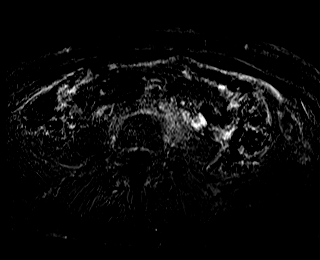

[Series 102: sub_3 min sub · axial · 3.0mm · 0.84mm/px · z∈[-2,+187]mm · 3 of 64 slices shown]
[im 1/64]
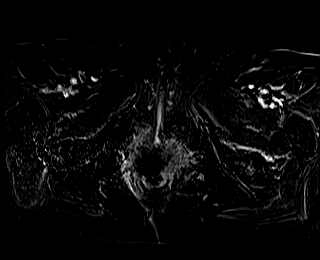
[im 32/64]
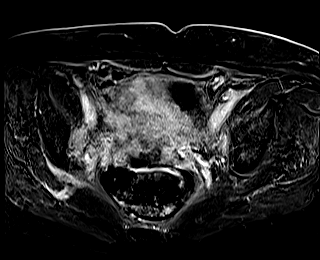
[im 64/64]
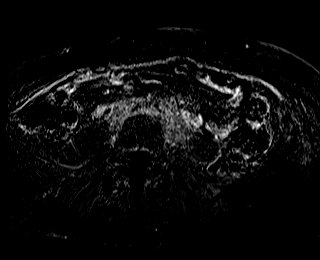

[Series 103: sub_5 min delay · axial · delayed · 3.0mm · 0.84mm/px · z∈[-2,+187]mm · 3 of 64 slices shown]
[im 1/64]
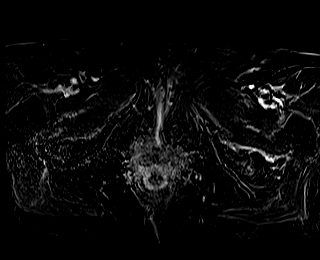
[im 32/64]
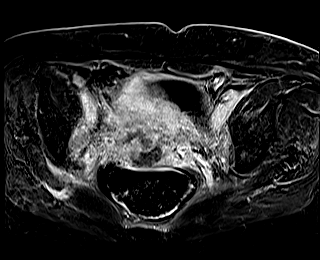
[im 64/64]
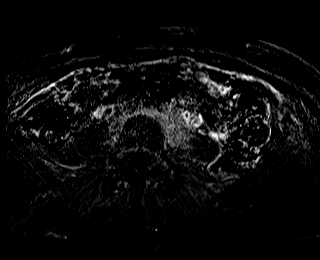

[48 of 48 positions shown; findings below may reference images not displayed]

FINDINGS: Urinary Tract: As seen on previous CT imaging, there is irregular
right posterolateral bladder wall thickening extending up into the
dome. MR imaging suggests associated trabeculation. Possible 12 mm
intramural or subserosal nodule noted anteriorly on the right (see
postcontrast axial image 39 of series 15).

Small gas bubble noted in the non dependent bladder lumen.

Bowel: Visualized bowel loops of the lower abdomen and pelvis are
unremarkable.

Vascular/Lymphatic: 9 mm short axis right pelvic sidewall lymph node
visible on postcontrast image 32 of series 15, increased from 7 mm
short axis on CT of [DATE]. [DATE] mm short axis right internal
iliac node measured on previous CT is 13 mm short axis today on
[DATE]. 11 mm short axis lymph node along the left external iliac
chain visible on 28/15. 11 mm short axis internal iliac node seen on
the left as well (image 16/15. There is probably a 14 mm left common
iliac node on image [DATE]. Upper normal lymph nodes are seen in the
region of the aortic bifurcation. No significant vascular
abnormality seen.

Reproductive: Endometrial cavity is abnormal, measuring 2.5 cm in AP
thickness on sagittal imaging. No adnexal mass.

Other:  No substantial intraperitoneal free fluid.

Musculoskeletal: No focal suspicious marrow enhancement within the
visualized bony anatomy.
IMPRESSION: 1. Irregular right posterolateral bladder wall thickening with
probable trabeculation. Appearance is similar to prior CT scan from
[DATE]. Possible 12 mm intramural or subserosal bladder wall
nodule noted anteriorly on the right. Imaging features remain
concerning for neoplasm.
2. Bilateral mild pelvic sidewall lymphadenopathy, concerning for
metastatic disease.
3. Abnormal endometrial cavity, measuring up to 2.5 cm in AP
thickness. In the absence of symptoms, endometrial thickness of
greater than 8 mm is considered abnormal in a postmenopausal female.
Neoplasm a distinct concern.
4. Small gas bubble in the non dependent bladder lumen, likely
related to recent instrumentation. In the absence of recent
instrumentation infection would become a more likely possibility

## 2020-11-09 MED ORDER — AMLODIPINE BESYLATE 10 MG PO TABS: 10.0000 mg | ORAL_TABLET | Freq: Every day | ORAL | 0 refills | Status: AC

## 2020-11-09 MED ORDER — METOPROLOL SUCCINATE ER 50 MG PO TB24: 50.0000 mg | ORAL_TABLET | Freq: Every day | ORAL | 0 refills | Status: AC

## 2020-11-09 MED ORDER — POTASSIUM CHLORIDE CRYS ER 20 MEQ PO TBCR
40.0000 meq | EXTENDED_RELEASE_TABLET | Freq: Two times a day (BID) | ORAL | Status: DC
  Administered 2020-11-09: 40 meq via ORAL
  Filled 2020-11-09: qty 2

## 2020-11-09 MED ORDER — ALPRAZOLAM 0.5 MG PO TABS: 0.5000 mg | ORAL_TABLET | ORAL | Status: DC | PRN

## 2020-11-09 MED ORDER — GADOBUTROL 1 MMOL/ML IV SOLN
5.0000 mL | Freq: Once | INTRAVENOUS | Status: AC | PRN
  Administered 2020-11-09: 5 mL via INTRAVENOUS

## 2020-11-09 NOTE — Progress Notes (Signed)
  S:  1) Pelvic mass - non-contrast CT at [facility] 10/29/2020 with right posterior bladder wall thickening vs mass and bilateral hydronephrosis. Cystoscopy 11/08/2020 with no bladder mass/mucosal lesion - mass posterior to bladder vs infiltrative mass. Needs contrast MRI or CT.   2) bilateral hydronephrosis - from pelvic mass. Cr 1.95 from baseline 0.5. S/p bilateral ureteral stent 11/09/2019. Good diuresis. Cr 2/27 1.34, 2/28 1.04    Today, Jacqueline Leach is seen for the above. She reports LLQ pain resolved with foley removal and she is voiding well.   O: Vitals:   11/08/20 2001 11/09/20 0629  BP: (!) 156/76 (!) 176/79  Pulse: 91 99  Resp: 20 20  Temp: 98.7 F (37.1 C) 98 F (36.7 C)  SpO2: 96% 94%    Intake/Output Summary (Last 24 hours) at 11/09/2020 0801 Last data filed at 11/09/2020 0200 Gross per 24 hour  Intake 2408.39 ml  Output 550 ml  Net 1858.39 ml   . Lab Results  Component Value Date   CREATININE 1.04 (H) 11/09/2020   CREATININE 1.34 (H) 11/08/2020   CREATININE 1.98 (H) 11/07/2020   NAD Resting in bed but arousable and alert and oriented   A/P -   1) pelvic mass - MRI ordered as she needs further evaluation   2) bilateral hydro -s/p bilateral stents and Cr has improved.

## 2020-11-09 NOTE — Care Management Important Message (Signed)
Medicare IM printed for Social work team at [facility] to give to the patient. °

## 2020-11-09 NOTE — Progress Notes (Signed)
Physical Therapy Treatment Patient Details Name: Jacqueline Leach MRN: [id number] DOB: 01-Aug-1956 Today's Date: 11/09/2020    History of Present Illness Pt is a 65 y.o. female admitted 11/03/20 with abdominal pain, malaise, nausea/vomiting since last month; pt recently seen at [facility] 10/29/20 with CT scan showing bladder tumor and constipation (was scheduled to see urology 11/20/20). Workup for AKI. PMH includes HTN, CVA (2020), DM2.    PT Comments    General Comments: AxO x 3 anxious because she trying to call daughter to tell her she is coming home today and daughter is not answering.  Assisted to bathroom.  General transfer comment: Supervision with  RW, standing from EOB, low toilet height (with grab bar) VC's on safety with turns and due to low vision.  General Gait Details: Slow, steady gait with RW and supervision for safety slight unsteadiness due to old stroke with r LE weakness.  Some assist to naviget walker thru doorway and in hallway due to low vision.  Tolerated distance well. Once pt is home and in her familiar environment, she will amb better.  Pt lives with her daughter.     Follow Up Recommendations  No PT follow up     Equipment Recommendations  None recommended by PT    Recommendations for Other Services       Precautions / Restrictions Precautions Precautions: Fall Precaution Comments: CVA R side weakness and low vision Restrictions Weight Bearing Restrictions: No    Mobility  Bed Mobility Overal bed mobility: Modified Independent             General bed mobility comments: HOB elevated increased time    Transfers Overall transfer level: Needs assistance Equipment used: Rolling walker (2 wheeled);None Transfers: Sit to/from [employer] to Stand: Supervision Stand pivot transfers: Supervision;Min guard       General transfer comment: Supervision with  RW, standing from EOB, low toilet height (with grab bar) VC's on  safety with turns and due to low vision  Ambulation/Gait Ambulation/Gait assistance: Supervision;Min guard Gait Distance (Feet): 38 Feet Assistive device: Rolling walker (2 wheeled) Gait Pattern/deviations: Step-through pattern;Decreased stride length;Antalgic Gait velocity: Decreased   General Gait Details: Slow, steady gait with RW and supervision for safety slight unsteadiness due to old stroke with r LE weakness.  Some assist to naviget walker thru doorway and in hallway due to low vision.  Tolerated distance well.   Stairs             Wheelchair Mobility    Modified Rankin (Stroke Patients Only)       Balance                                            Cognition Arousal/Alertness: Awake/alert Behavior During Therapy: WFL for tasks assessed/performed                                   General Comments: AxO x 3 anxious because she trying to call daughter to tell her she is coming home today and daughter is not answering      Exercises      General Comments        Pertinent Vitals/Pain Pain Assessment: No/denies pain    Home Living  Prior Function            PT Goals (current goals can now be found in the care plan section) Progress towards PT goals: Progressing toward goals    Frequency           PT Plan Current plan remains appropriate    Co-evaluation              AM-PAC PT "6 Clicks" Mobility   Outcome Measure  Help needed turning from your back to your side while in a flat bed without using bedrails?: None Help needed moving from lying on your back to sitting on the side of a flat bed without using bedrails?: None Help needed moving to and from a bed to a chair (including a wheelchair)?: None Help needed standing up from a chair using your arms (e.g., wheelchair or bedside chair)?: None Help needed to walk in hospital room?: A Little Help needed climbing 3-5 steps  with a railing? : A Little 6 Click Score: 22    End of Session Equipment Utilized During Treatment: Gait belt Activity Tolerance: Patient tolerated treatment well Patient left: with call bell/phone within reach;in bed;with bed alarm set   PT Visit Diagnosis: Other abnormalities of gait and mobility (R26.89)     Time: 3614-4315 PT Time Calculation (min) (ACUTE ONLY): 25 min  Charges:  $Gait Training: 8-22 mins $Therapeutic Activity: 8-22 mins                     Rica Koyanagi  PTA Acute  Rehabilitation Services Pager      [phone] Office      [phone]

## 2020-11-09 NOTE — Discharge Summary (Signed)
Physician Discharge Summary  Jacqueline Leach OXB:[id number] DOB: September 16, 1955 DOA: 11/03/2020  PCP: [facility]  Admit date: 11/03/2020 Discharge date: 11/09/2020  Admitted From: Home Disposition:  Home  Recommendations for Outpatient Follow-up:  1. Follow up with PCP in 1-2 weeks 2. Follow up with Urology as scheduled in 2-3 weeks 3. Follow up with Gyn-Onc as scheduled  Discharge Condition:Stable CODE STATUS:Full Diet recommendation: Diabetic   Brief/Interim Summary: 65 y.o.femalewith medical history significant ofessential hypertension, hyperlipidemia, history of other nonhemorrhagic CVA, type 2 diabetes who is coming to the emergency department due to abdominal pain associated with decreased urination, fatigue, malaise, decreased appetite, nausea with occasional vomiting since last month. She has been constipated for the last 2 weeks. Denies melena or hematochezia. Also has urinary frequency with occasional dysuria and tenesmus. She was seen at [facility] on 10/29/2020 where she had a CT scan that showed a bladder tumor and constipation. She is a scheduled to be seen by urology on 11/20/2020, but the patient states that her symptoms were worsening so she decided to come to the emergency department. She denies fevers, but complains of chills and left-sided frontal headaches. Denies sore throat, rhinorrhea, wheezing, hemoptysis, dyspnea, chest pain, palpitations, diaphoresis,, orthopnea, PND or pitting edema of the lower extremities. No polyuria, polydipsia, polyphagia or blurred vision.  Discharge Diagnoses:  Principal Problem:   AKI (acute kidney injury) ([place]) Active Problems:   Essential hypertension   Hyperlipidemia   Type II diabetes mellitus, uncontrolled ([place])   Hypokalemia   Normocytic anemia   Hypoalbuminemia   Mild protein malnutrition (HCC)   ARF (acute renal failure) (HCC)   Preop examination  Principal Problem: AKI (acute kidney  injury) ([place]) Likely in the setting of bladder tumor/hydronephrosis. Continue IV fluids. Hold lisinopril. Avoid hypotension. Continue to avoid nephrotoxic medications. Renal US reviewed, findings confirming bladder mass with B mod-severe hydronephrosis Urology consulted, appreciate assistance, see below Renal function now improving post-stent placement. Cr down to 1.0  Abnormal endometrium -incidentally noted and reviewed on MRI, see below -Appreciate input from Gyn-Onc who will follow up with patient closely as outpatient  Bladder mass Pt is now s/p cystoscopy with stent placement 2/26 Renal function improved and pt underwent MRI on 2/28. Findings reviewed and notable for bladder mass with nodule, metastatic appearing nodes, and an abnormal appearing endometrium Discussed with Urology, who will arrange close follow up Gyn-Onc was later involved, who will also follow up closely with patient post-discharge  Active Problems: Hypokalemia Replaced  Essential hypertension Holding lisinopril given ARF Continue amlodipine 10 mg p.o. daily. Have added metoprolol, dose increased to 50mg   Hyperlipidemia Continue atorvastatin 40 mg p.o. daily.  Type II diabetes mellitus, uncontrolled (HCC) Carbohydrate modified diet. Cont with SSI coverage as needed while in hospital Hgb a1c noted to be 5.5  Normocytic anemia Remained hemodynamically stable  Hypoalbuminemia Mild protein malnutrition (HCC) Mild generalized weakness. No muscle wasting noticed. Increase protein intake. Dietitian was consulted  Nausea -Resolved -Advanced diet successfully   Discharge Instructions   Allergies as of 11/09/2020      Reactions   Latex Rash   Penicillins Rash   Did it involve swelling of the face/tongue/throat, SOB, or low BP? Yes Did it involve sudden or severe rash/hives, skin peeling, or any reaction on the inside of your mouth or nose? Yes Did you need to seek  medical attention at a hospital or doctor's office? Yes When did it last happen?approx 65yo If all above answers are "NO", may proceed with  cephalosporin use.   Sulfa Antibiotics Rash      Medication List    STOP taking these medications   lisinopril 20 MG tablet Commonly known as: ZESTRIL   potassium chloride 10 MEQ CR capsule Commonly known as: MICRO-K     TAKE these medications   acetaminophen 325 MG tablet Commonly known as: TYLENOL Take 2 tablets (650 mg total) by mouth every 4 (four) hours as needed for mild pain (or temp > 37.5 C (99.5 F)).   amLODipine 10 MG tablet Commonly known as: NORVASC Take 1 tablet (10 mg total) by mouth daily. Start taking on: November 10, 2020 What changed:   medication strength  how much to take   atorvastatin 40 MG tablet Commonly known as: LIPITOR Take 40 mg by mouth at bedtime.   insulin NPH Human 100 UNIT/ML injection Commonly known as: NOVOLIN N Inject 10 Units into the skin as needed (If higher than 200 mg).   metoprolol succinate 50 MG 24 hr tablet Commonly known as: Toprol XL Take 1 tablet (50 mg total) by mouth daily. Take with or immediately following a meal.   pantoprazole 40 MG tablet Commonly known as: PROTONIX Take 1 tablet (40 mg total) by mouth daily.   senna-docusate 8.6-50 MG tablet Commonly known as: Senokot-S Take 1 tablet by mouth at bedtime. What changed:   when to take this  reasons to take this   traZODone 100 MG tablet Commonly known as: DESYREL Take 100 mg by mouth at bedtime.       Follow-up Information    Festus Aloe, MD. Call.   Specialty: Urology Contact information: [place] [address] [phone]        [facility]. Schedule an appointment as soon as possible for a visit in 2 week(s).   Specialty: Family Medicine Contact information: [place] [address] [phone]        Lafonda Mosses, MD Follow up.    Specialty: Gynecologic Oncology Why: as scheduled Contact information: [address] [place] [address] [phone]              Allergies  Allergen Reactions  . Latex Rash  . Penicillins Rash    Did it involve swelling of the face/tongue/throat, SOB, or low BP? Yes Did it involve sudden or severe rash/hives, skin peeling, or any reaction on the inside of your mouth or nose? Yes Did you need to seek medical attention at a hospital or doctor's office? Yes When did it last happen?approx 65yo If all above answers are "NO", may proceed with cephalosporin use.   . Sulfa Antibiotics Rash    Consultations:  Urology  Procedures/Studies: MR PELVIS W WO CONTRAST  Result Date: 11/09/2020 CLINICAL DATA:  Bladder wall thickening on previous CT scan. EXAM: MRI PELVIS WITHOUT AND WITH CONTRAST TECHNIQUE: Multiplanar multisequence MR imaging of the pelvis was performed both before and after administration of intravenous contrast. CONTRAST:  26mL GADAVIST GADOBUTROL 1 MMOL/ML IV SOLN COMPARISON:  CT urogram 10/29/2020. CT abdomen/pelvis CT with contrast 10/08/2020. FINDINGS: Urinary Tract: As seen on previous CT imaging, there is irregular right posterolateral bladder wall thickening extending up into the dome. MR imaging suggests associated trabeculation. Possible 12 mm intramural or subserosal nodule noted anteriorly on the right (see postcontrast axial image 39 of series 15). Small gas bubble noted in the non dependent bladder lumen. Bowel: Visualized bowel loops of the lower abdomen and pelvis are unremarkable. Vascular/Lymphatic: 9 mm  short axis right pelvic sidewall lymph node visible on postcontrast image 32 of series 15, increased from 7 mm short axis on CT of 10/08/2020. 11 mm short axis right internal iliac node measured on previous CT is 13 mm short axis today on 12/15. 11 mm short axis lymph node along the left external iliac chain visible on 28/15. 11 mm short axis  internal iliac node seen on the left as well (image 16/15. There is probably a 14 mm left common iliac node on image 4/15. Upper normal lymph nodes are seen in the region of the aortic bifurcation. No significant vascular abnormality seen. Reproductive: Endometrial cavity is abnormal, measuring 2.5 cm in AP thickness on sagittal imaging. No adnexal mass. Other:  No substantial intraperitoneal free fluid. Musculoskeletal: No focal suspicious marrow enhancement within the visualized bony anatomy. IMPRESSION: 1. Irregular right posterolateral bladder wall thickening with probable trabeculation. Appearance is similar to prior CT scan from 10/08/2020. Possible 12 mm intramural or subserosal bladder wall nodule noted anteriorly on the right. Imaging features remain concerning for neoplasm. 2. Bilateral mild pelvic sidewall lymphadenopathy, concerning for metastatic disease. 3. Abnormal endometrial cavity, measuring up to 2.5 cm in AP thickness. In the absence of symptoms, endometrial thickness of greater than 8 mm is considered abnormal in a postmenopausal female. Neoplasm a distinct concern. 4. Small gas bubble in the non dependent bladder lumen, likely related to recent instrumentation. In the absence of recent instrumentation infection would become a more likely possibility Electronically Signed   By: Misty Stanley M.D.   On: 11/09/2020 11:36   US RENAL  Result Date: 11/05/2020 CLINICAL DATA:  Bladder mass. EXAM: RENAL / URINARY TRACT ULTRASOUND COMPLETE COMPARISON:  None. FINDINGS: Right Kidney: Renal measurements: 10.6 cm x 5.0 cm x 4.3 cm = volume: 118.78 mL. Echogenicity within normal limits. No mass or is visualized. There is moderate severity right-sided hydronephrosis. Left Kidney: Renal measurements: 13.0 cm x 5.9 cm x 4.3 cm = volume: 169.16 mL. Echogenicity within normal limits. No mass is visualized. There is moderate severity left-sided hydronephrosis. Bladder: A heterogeneous thickened right-sided  urinary bladder wall is noted. Other: None. IMPRESSION: 1. Moderate severity bilateral hydronephrosis. 2. Asymmetric heterogeneous urinary bladder wall thickening, as described above. Sequelae associated with an underlying neoplastic process cannot be excluded. Electronically Signed   By: Virgina Norfolk M.D.   On: 11/05/2020 19:59   DG CHEST PORT 1 VIEW  Result Date: 11/06/2020 CLINICAL DATA:  Preop exam. EXAM: PORTABLE CHEST 1 VIEW COMPARISON:  08/09/2019 FINDINGS: The heart size and mediastinal contours are within normal limits. Both lungs are clear. The visualized skeletal structures are unremarkable. IMPRESSION: No active disease. Electronically Signed   By: Nolon Nations M.D.   On: 11/06/2020 15:56   DG C-Arm 1-60 Min-No Report  Result Date: 11/07/2020 Fluoroscopy was utilized by the requesting physician.  No radiographic interpretation.     Subjective: Very eager to go home  Discharge Exam: Vitals:   11/09/20 0629 11/09/20 1229  BP: (!) 176/79 (!) 151/79  Pulse: 99 99  Resp: 20 18  Temp: 98 F (36.7 C) (!) 97.5 F (36.4 C)  SpO2: 94% 96%   Vitals:   11/08/20 1400 11/08/20 2001 11/09/20 0629 11/09/20 1229  BP: (!) 157/72 (!) 156/76 (!) 176/79 (!) 151/79  Pulse: 85 91 99 99  Resp:  20 20 18   Temp:  98.7 F (37.1 C) 98 F (36.7 C) (!) 97.5 F (36.4 C)  TempSrc:  Oral  Oral  SpO2:  96% 94% 96%  Weight:      Height:        General: Pt is alert, awake, not in acute distress Cardiovascular: RRR, S1/S2 +, no rubs, no gallops Respiratory: CTA bilaterally, no wheezing, no rhonchi Abdominal: Soft, NT, ND, bowel sounds + Extremities: no edema, no cyanosis   The results of significant diagnostics from this hospitalization (including imaging, microbiology, ancillary and laboratory) are listed below for reference.     Microbiology: Recent Results (from the past 240 hour(s))  Urine culture     Status: Abnormal   Collection Time: 11/03/20  5:07 PM   Specimen: Urine,  Random  Result Value Ref Range Status   Specimen Description URINE, RANDOM  Final   Special Requests ADDED 2226  Final   Culture (A)  Final    <10,000 COLONIES/mL INSIGNIFICANT GROWTH Performed at [facility], [place] [address], [place], [place] [address]    Report Status 11/05/2020 FINAL  Final  SARS CORONAVIRUS 2 (TAT 6-24 HRS) Nasopharyngeal Nasopharyngeal Swab     Status: None   Collection Time: 11/03/20  7:14 PM   Specimen: Nasopharyngeal Swab  Result Value Ref Range Status   SARS Coronavirus 2 NEGATIVE NEGATIVE Final    Comment: (NOTE) SARS-CoV-2 target nucleic acids are NOT DETECTED.  The SARS-CoV-2 RNA is generally detectable in upper and lower respiratory specimens during the acute phase of infection. Negative results do not preclude SARS-CoV-2 infection, do not rule out co-infections with other pathogens, and should not be used as the sole basis for treatment or other patient management decisions. Negative results must be combined with clinical observations, patient history, and epidemiological information. The expected result is Negative.  Fact Sheet for Patients: SugarRoll.be  Fact Sheet for Healthcare Providers: https://www.woods-mathews.com/  This test is not yet approved or cleared by the [place] FDA and  has been authorized for detection and/or diagnosis of SARS-CoV-2 by FDA under an Emergency Use Authorization (EUA). This EUA will remain  in effect (meaning this test can be used) for the duration of the COVID-19 declaration under Se ction 564(b)(1) of the Act, 21 U.S.C. section 360bbb-3(b)(1), unless the authorization is terminated or revoked sooner.  Performed at [facility], [place] [address] [place], [place] [address]   Surgical PCR screen     Status: None   Collection Time: 11/06/20 11:28 PM   Specimen: Nasal Mucosa; Nasal Swab  Result Value Ref Range Status   MRSA, PCR NEGATIVE NEGATIVE Final    Staphylococcus aureus NEGATIVE NEGATIVE Final    Comment: (NOTE) The Xpert SA Assay (FDA approved for NASAL specimens in patients 72 years of age and older), is one component of a comprehensive surveillance program. It is not intended to diagnose infection nor to guide or monitor treatment. Performed at [facility], [place] [address], [place], [place] [address]      Labs: BNP (last 3 results) No results for input(s): BNP in the last 8760 hours. Basic Metabolic Panel: Recent Labs  Lab 11/03/20 1839 11/03/20 2130 11/04/20 0242 11/05/20 0100 11/06/20 0136 11/07/20 0134 11/08/20 0509 11/09/20 0441  NA  --   --    < > 140 140 140 142 138  K  --   --    < > 3.0* 3.9 3.1* 3.7 3.0*  CL  --   --    < > 108 108 108 107 102  CO2  --   --    < > 22 23 24  25 25  GLUCOSE  --   --    < > 114* 137* 180* 129* 140*  BUN  --   --    < > 16 21 21  24* 19  CREATININE  --   --    < > 1.65* 1.89* 1.98* 1.34* 1.04*  CALCIUM  --   --    < > 8.4* 8.3* 8.9 8.8* 8.6*  MG  --  1.7  --  1.7  --   --   --   --   PHOS 4.1 4.3  --   --   --   --   --   --    < > = values in this interval not displayed.   Liver Function Tests: Recent Labs  Lab 11/05/20 0100 11/06/20 0136 11/07/20 0134 11/08/20 0509 11/09/20 0441  AST <5* 16 16 16 16   ALT 10 11 11 12 13   ALKPHOS 60 69 81 64 60  BILITOT 0.3 0.3 0.5 0.4 0.5  PROT 5.1* 5.2* 6.0* 6.1* 6.1*  ALBUMIN 2.5* 2.5* 2.9* 3.0* 3.1*   Recent Labs  Lab 11/03/20 1713  LIPASE 31   No results for input(s): AMMONIA in the last 168 hours. CBC: Recent Labs  Lab 11/04/20 0242 11/05/20 0100 11/06/20 0136 11/07/20 0134 11/09/20 0441  WBC 9.8 9.3 10.3 11.6* 10.5  HGB 9.6* 8.9* 8.9* 9.7* 9.8*  HCT 27.4* 25.1* 26.0* 28.8* 29.7*  MCV 87.0 85.4 87.2 85.7 89.7  PLT 185 169 184 194 217   Cardiac Enzymes: No results for input(s): CKTOTAL, CKMB, CKMBINDEX, TROPONINI in the last 168 hours. BNP: Invalid input(s): POCBNP CBG: Recent Labs  Lab  11/08/20 1149 11/08/20 1628 11/08/20 1956 11/09/20 0722 11/09/20 1120  GLUCAP 165* 146* 155* 133* 136*   D-Dimer No results for input(s): DDIMER in the last 72 hours. Hgb A1c No results for input(s): HGBA1C in the last 72 hours. Lipid Profile No results for input(s): CHOL, HDL, LDLCALC, TRIG, CHOLHDL, LDLDIRECT in the last 72 hours. Thyroid function studies No results for input(s): TSH, T4TOTAL, T3FREE, THYROIDAB in the last 72 hours.  Invalid input(s): FREET3 Anemia work up No results for input(s): VITAMINB12, FOLATE, FERRITIN, TIBC, IRON, RETICCTPCT in the last 72 hours. Urinalysis    Component Value Date/Time   COLORURINE STRAW (A) 11/03/2020 1707   APPEARANCEUR CLEAR 11/03/2020 1707   LABSPEC 1.009 11/03/2020 1707   PHURINE 6.0 11/03/2020 1707   GLUCOSEU NEGATIVE 11/03/2020 1707   HGBUR MODERATE (A) 11/03/2020 1707   BILIRUBINUR NEGATIVE 11/03/2020 1707   KETONESUR NEGATIVE 11/03/2020 1707   PROTEINUR NEGATIVE 11/03/2020 1707   NITRITE NEGATIVE 11/03/2020 1707   LEUKOCYTESUR NEGATIVE 11/03/2020 1707   Sepsis Labs Invalid input(s): PROCALCITONIN,  WBC,  LACTICIDVEN Microbiology Recent Results (from the past 240 hour(s))  Urine culture     Status: Abnormal   Collection Time: 11/03/20  5:07 PM   Specimen: Urine, Random  Result Value Ref Range Status   Specimen Description URINE, RANDOM  Final   Special Requests ADDED 2226  Final   Culture (A)  Final    <10,000 COLONIES/mL INSIGNIFICANT GROWTH Performed at [facility], [place] [address], [place], [place] [address]    Report Status 11/05/2020 FINAL  Final  SARS CORONAVIRUS 2 (TAT 6-24 HRS) Nasopharyngeal Nasopharyngeal Swab     Status: None   Collection Time: 11/03/20  7:14 PM   Specimen: Nasopharyngeal Swab  Result Value Ref Range Status   SARS Coronavirus 2 NEGATIVE NEGATIVE Final  Comment: (NOTE) SARS-CoV-2 target nucleic acids are NOT DETECTED.  The SARS-CoV-2 RNA is generally detectable in upper and  lower respiratory specimens during the acute phase of infection. Negative results do not preclude SARS-CoV-2 infection, do not rule out co-infections with other pathogens, and should not be used as the sole basis for treatment or other patient management decisions. Negative results must be combined with clinical observations, patient history, and epidemiological information. The expected result is Negative.  Fact Sheet for Patients: SugarRoll.be  Fact Sheet for Healthcare Providers: https://www.woods-mathews.com/  This test is not yet approved or cleared by the [place] FDA and  has been authorized for detection and/or diagnosis of SARS-CoV-2 by FDA under an Emergency Use Authorization (EUA). This EUA will remain  in effect (meaning this test can be used) for the duration of the COVID-19 declaration under Se ction 564(b)(1) of the Act, 21 U.S.C. section 360bbb-3(b)(1), unless the authorization is terminated or revoked sooner.  Performed at [facility], [place] [address], [place], [place] [address]   Surgical PCR screen     Status: None   Collection Time: 11/06/20 11:28 PM   Specimen: Nasal Mucosa; Nasal Swab  Result Value Ref Range Status   MRSA, PCR NEGATIVE NEGATIVE Final   Staphylococcus aureus NEGATIVE NEGATIVE Final    Comment: (NOTE) The Xpert SA Assay (FDA approved for NASAL specimens in patients 4 years of age and older), is one component of a comprehensive surveillance program. It is not intended to diagnose infection nor to guide or monitor treatment. Performed at [facility], [place] [address], [place], [place] [address]    Time spent: 30 min  SIGNED:   Marylu Lund, MD  [facility] 11/09/2020, 2:12 PM  If 7PM-7AM, please contact night-coverage

## 2020-11-12 ENCOUNTER — Encounter: Payer: Self-pay | Admitting: Gynecologic Oncology

## 2020-11-13 ENCOUNTER — Inpatient Hospital Stay: Payer: Medicare HMO | Admitting: Gynecologic Oncology

## 2020-11-13 DIAGNOSIS — R1900 Intra-abdominal and pelvic swelling, mass and lump, unspecified site: Secondary | ICD-10-CM

## 2020-11-16 ENCOUNTER — Other Ambulatory Visit: Payer: Self-pay

## 2020-11-16 ENCOUNTER — Inpatient Hospital Stay: Payer: Medicare HMO | Attending: Gynecologic Oncology | Admitting: Gynecologic Oncology

## 2020-11-16 ENCOUNTER — Inpatient Hospital Stay: Payer: Medicare HMO

## 2020-11-16 ENCOUNTER — Encounter: Payer: Self-pay | Admitting: Gynecologic Oncology

## 2020-11-16 VITALS — BP 131/81 | HR 98 | Temp 97.8°F | Resp 18 | Ht 62.0 in | Wt 128.0 lb

## 2020-11-16 DIAGNOSIS — R5381 Other malaise: Secondary | ICD-10-CM | POA: Diagnosis not present

## 2020-11-16 DIAGNOSIS — E8809 Other disorders of plasma-protein metabolism, not elsewhere classified: Secondary | ICD-10-CM | POA: Diagnosis not present

## 2020-11-16 DIAGNOSIS — R590 Localized enlarged lymph nodes: Secondary | ICD-10-CM | POA: Diagnosis not present

## 2020-11-16 DIAGNOSIS — K5900 Constipation, unspecified: Secondary | ICD-10-CM | POA: Diagnosis not present

## 2020-11-16 DIAGNOSIS — Z8673 Personal history of transient ischemic attack (TIA), and cerebral infarction without residual deficits: Secondary | ICD-10-CM | POA: Diagnosis not present

## 2020-11-16 DIAGNOSIS — C541 Malignant neoplasm of endometrium: Secondary | ICD-10-CM | POA: Insufficient documentation

## 2020-11-16 DIAGNOSIS — R1900 Intra-abdominal and pelvic swelling, mass and lump, unspecified site: Secondary | ICD-10-CM | POA: Diagnosis present

## 2020-11-16 DIAGNOSIS — Z79899 Other long term (current) drug therapy: Secondary | ICD-10-CM | POA: Insufficient documentation

## 2020-11-16 DIAGNOSIS — E785 Hyperlipidemia, unspecified: Secondary | ICD-10-CM | POA: Diagnosis not present

## 2020-11-16 DIAGNOSIS — E119 Type 2 diabetes mellitus without complications: Secondary | ICD-10-CM | POA: Insufficient documentation

## 2020-11-16 DIAGNOSIS — Z800 Family history of malignant neoplasm of digestive organs: Secondary | ICD-10-CM | POA: Insufficient documentation

## 2020-11-16 DIAGNOSIS — I10 Essential (primary) hypertension: Secondary | ICD-10-CM | POA: Insufficient documentation

## 2020-11-16 DIAGNOSIS — N1330 Unspecified hydronephrosis: Secondary | ICD-10-CM | POA: Insufficient documentation

## 2020-11-16 DIAGNOSIS — R599 Enlarged lymph nodes, unspecified: Secondary | ICD-10-CM

## 2020-11-16 LAB — BASIC METABOLIC PANEL
Anion gap: 9 (ref 5–15)
BUN: 20 mg/dL (ref 8–23)
CO2: 23 mmol/L (ref 22–32)
Calcium: 9.5 mg/dL (ref 8.9–10.3)
Chloride: 105 mmol/L (ref 98–111)
Creatinine, Ser: 1.15 mg/dL — ABNORMAL HIGH (ref 0.44–1.00)
GFR, Estimated: 53 mL/min — ABNORMAL LOW (ref >60)
Glucose, Bld: 157 mg/dL — ABNORMAL HIGH (ref 70–99)
Potassium: 3.6 mmol/L (ref 3.5–5.1)
Sodium: 137 mmol/L (ref 135–145)

## 2020-11-16 NOTE — Progress Notes (Signed)
GYNECOLOGIC ONCOLOGY NEW PATIENT CONSULTATION   Patient Name: Jacqueline Leach  Patient Age: 65 y.o. 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  I reviewed my concerns with Jacqueline Leach and her daughter regarding her workup thus far which is suspicious for a gyn cancer. Based on my exam today and significant bilateral hydronephrosis, I would favor a cervical origin. I will call the patient as soon as I have the results from biopsies performed today. In the meantime, I have ordered completion imaging (needs abdominal and chest imaging) to outline extent of disease. I worry some that she may have osseous mets or disease that is causing nerve compression leading to increasing weakness and numbness of her right LE.   Her significant functional decline is worrisome. Depending on the origin and extent of disease, she may be a candidate for surgical debulking. I worry that in the setting of her hypoalbuminemia and poor performance status, she would have significant morbidity associated with surgery. We discussed the possibility that, in the setting of a cervix cancer, surgery would not be the recommended primary treatment modality and that we would discuss chemoradiation.   I have encouraged her to start using Mirilax to help with her constipation. I did not appreciate stool or impaction on my RV exam today.    I am placing a genetics referral given her family history (brother with pancreatic cancer).  A copy of this note was sent to the patient's referring provider.   75 minutes of total time was spent for this patient encounter, including preparation, face-to-face counseling with the patient and coordination of care,  and documentation of the encounter.   Jeral Pinch, MD  Division of Gynecologic Oncology  Department of Obstetrics and Gynecology  [facility]  ___________________________________________  Chief Complaint: Chief Complaint  Patient presents with  . Pelvic mass    History of Present Illness:  Jacqueline Leach is a 65 y.o. y.o. female who is seen in consultation at the request of Dr. Junious Silk for an evaluation of cervical/uterine mass and evidence of retroperitoneal adenopathy on imaging.  The patient presents with her daughter to today's visit.   At the end of January, the patient presented to the hospital in [place] for significant constipation. 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 Prior to this, she describes her bowel function as being regular and normal. Previous notes comment that she returned to [facility] on 2/17 and underwent repeat CT (persistent bilateral hydro - stable, concern for primary bladder neoplasm, rectum distended with stool, possible proctitis, enlarged retroperitoneal and left external iliac chain nodes).  She has a history of a stroke a couple of years ago with some deficits on her right side after. The loss of sensation and decreased function of her right leg is new over the last 2-3 months. She had multiple falls since January and her daughter describes that she has become increasingly deconditioned. Prior to January, she was very independent. Now, she can not perform many ADLs on her own (can't shower or dress herself, has to walk with assistance).  She presented to the ED on 2/22 reporting one month of malaise, weight  loss.  She noted 2 months of decreased urinary output, low abdominal pain x2 weeks, nausea, and occasional emesis. She had not had a bowel movement in 2-3 weeks.  She'd also not been able to arrange outpatient follow-up with urology as had been recommended based on two prior CT scans.  She was hospitalized in the setting of AKI and bladder mass. During her hospitalization, she underwent EUA, cysto with retrograde pyelogram and bilateral stent placement. Mucosa was normal appearing and exam concerning for an external mass causing compression and possible infiltration. Kidney function improved after stent placement and patient was discharged home on 2/28.  Since discharge, the patient has had two falls which she attributes to decreased sensation in right leg. She denies bowel movement since enema last week in the hospital. She is urinating but overall less (is having difficulty drinking a lot of fluids).   She has one day of vaginal bleeding 2-3 weeks ago. Otherwise, she denies vaginal bleeding or discharge.  The patient lives in [place] with her 2 daughters and two grandkids. She is retired.   She receives medical care at [facility] in [place]. She goes every 3-6 months for her diabetes. She describes using insulin prn.   PAST MEDICAL HISTORY:  Past Medical History:  Diagnosis Date  . Essential hypertension 08/12/2019  . Hyperlipidemia 08/12/2019  . Stroke ([place])   . Type II diabetes mellitus, uncontrolled ([place]) 08/12/2019     PAST SURGICAL HISTORY:  Past Surgical History:  Procedure Laterality Date  . CHOLECYSTECTOMY    . CYSTOSCOPY W/ URETERAL STENT PLACEMENT Bilateral 11/07/2020   Procedure: CYSTOSCOPY WITH RETROGRADE PYELOGRAM bilateral Wyvonnia Dusky STENT PLACEMENT bilateral;  Surgeon: Festus Aloe, MD;  Location: [facility];  Service: Urology;  Laterality: Bilateral;  REQUESTING 90 MINS FOR CASE  . TRANSURETHRAL RESECTION OF BLADDER TUMOR N/A 11/07/2020   Procedure: exam under anesthesia;  Surgeon: Festus Aloe, MD;  Location: [facility];  Service: Urology;  Laterality: N/A;    OB/GYN HISTORY:  OB History  Gravida Para Term Preterm AB  Living  3 3          SAB IAB Ectopic Multiple Live Births               # Outcome Date GA Lbr Len/2nd Weight Sex Delivery Anes PTL Lv  3 Para           2 Para           1 Para             No LMP recorded. Patient is postmenopausal.  Age at menarche: 52 Age at menopause: 70 Hx of HRT: denies Hx of STDs: denies Last pap: patient reports 2022 History of abnormal pap smears: remote history of abnormal pap; patient thinks she may have had cryotherapy and had normal paps subsequently  SCREENING STUDIES:  Last mammogram: 2021  Last colonoscopy: has never had  MEDICATIONS: Outpatient Encounter Medications as of 11/16/2020  Medication Sig  . acetaminophen (TYLENOL) 325 MG tablet Take 2 tablets (650 mg total) by mouth every 4 (four) hours as needed for mild pain (or temp > 37.5 C (99.5 F)).  Marland Kitchen amLODipine (NORVASC) 10 MG tablet Take 1 tablet (10 mg total) by mouth daily.  Marland Kitchen atorvastatin (LIPITOR) 40 MG tablet Take 40 mg by mouth at bedtime.  . insulin NPH Human (NOVOLIN N) 100 UNIT/ML injection Inject 10 Units into the skin as needed (If higher than 200 mg).  . metoprolol succinate (TOPROL XL) 50 MG  24 hr tablet Take 1 tablet (50 mg total) by mouth daily. Take with or immediately following a meal.  . pantoprazole (PROTONIX) 40 MG tablet Take 1 tablet (40 mg total) by mouth daily. (Patient not taking: No sig reported)  . senna-docusate (SENOKOT-S) 8.6-50 MG tablet Take 1 tablet by mouth at bedtime. (Patient taking differently: Take 1 tablet by mouth at bedtime as needed for mild constipation.)  . traZODone (DESYREL) 100 MG tablet Take 100 mg by mouth at bedtime.   No facility-administered encounter medications on file as of 11/16/2020.    ALLERGIES:  Allergies  Allergen Reactions  . Latex Rash  . Penicillins Rash    Did it involve swelling of the face/tongue/throat, SOB, or low BP? Yes Did it involve sudden or severe rash/hives, skin peeling, or any reaction on the inside of your mouth  or nose? Yes Did you need to seek medical attention at a hospital or doctor's office? Yes When did it last happen?approx 65yo If all above answers are "NO", may proceed with cephalosporin use.   . Sulfa Antibiotics Rash     FAMILY HISTORY:  Family History  Problem Relation Age of Onset  . Hypertension Mother   . Pancreatic cancer Brother   . Breast cancer Paternal Aunt   . Colon cancer Neg Hx   . Ovarian cancer Neg Hx   . Uterine cancer Neg Hx   . Prostate cancer Neg Hx      SOCIAL HISTORY:  Social Connections: Not on file    REVIEW OF SYSTEMS:  Pertinent positives as per HPI Denies fevers, chills. Denies hearing loss, neck lumps or masses, mouth sores, ringing in ears or voice changes. Denies cough or wheezing.  Denies shortness of breath. Denies chest pain or palpitations. Denies leg swelling. Denies abdominal distention, pain, blood in stools, diarrhea, nausea, vomiting, or early satiety. Denies pain with intercourse, dysuria, hematuria or incontinence. Denies hot flashes,  vaginal bleeding or vaginal discharge.   Denies joint pain, back pain or muscle pain/cramps. Denies itching, rash, or wounds. Denies dizziness, headaches, numbness or seizures. Denies swollen lymph nodes or glands, denies easy bruising or bleeding. Denies anxiety, depression, confusion, or decreased concentration.  Physical Exam:  Vital Signs for this encounter:  Blood pressure 131/81, pulse 98, temperature 97.8 F (36.6 C), temperature source Tympanic, resp. rate 18, height 5\' 2"  (1.575 m), weight 128 lb (58.1 kg), SpO2 100 %. Body mass index is 23.41 kg/m. General: Alert, oriented, no acute distress. Appears deconditioned, needs significant assistance with any kind of movement. HEENT: Normocephalic, atraumatic. Sclera anicteric.  Chest: Clear to auscultation bilaterally. No wheezes, rhonchi, or rales. Cardiovascular: Regular rate and rhythm, no murmurs, rubs, or gallops.  Abdomen:  Normoactive bowel sounds. Soft, nondistended, nontender to palpation. No masses or hepatosplenomegaly appreciated. No palpable fluid wave.  Extremities: Grossly normal range of motion although decreased sensation and control of right LE. Warm, well perfused. Trace edema bilaterally.  Skin: No rashes or lesions.  Lymphatics: No cervical, supraclavicular, or inguinal adenopathy.  GU:  Normal external female genitalia.  No lesions. No discharge or bleeding.             Bladder/urethra:  No lesions or masses, well supported bladder             Vagina: mildly atrophic.             Cervix: Normal is size. Atypical vessels noted on anterior lip of the cervix as well as posterior lip. Os visually dilated  some. Posterior lip somewhat friable and bleeds more than expected with biopsies. No discrete mass.             Uterus: Very bulbous upper cervix and LUS, palpably at least 6 cm, no extending to the sidewalls. Some decreased mobility of the uterus. Thickening of the parametria noted concerning for involvement, no discrete nodularity.             Adnexa: No masses appreciated.  Rectal: Findings confirmed above.  Cervical and endometrial biopsies: Preoperative diagnosis: uterine vs cervical mass, adenopathy on imaging Postoperative diagnosis: same Physician: Berline Lopes MD Specimens: cervix (posterior lip), EMB EBL: <25cc Procedure: the procedure was explained in detail including risks, alternatives and benefits. After the patient gave verbal consent, she was placed in dorsal lithotomy position. Exam was performed as noted above. A speculum was placed int he vagina and cervix well visualized. The cervix was cleansed with betadine x3. Two biopsies were taken from near 6 o'clock of the posterior lip of the cervix. An endometrial pipelle was then inserted to a depth of 8-9cm and biopsy taken. Two passes were performed with adequate tissue obtained. Monsels was used to achieve hemostasis of cervical biopsy sites. All  instruments were removed from the vagina. Overall the patient tolerated the procedure well.  LABORATORY AND RADIOLOGIC DATA:  Outside medical records were reviewed to synthesize the above history, along with the history and physical obtained during the visit.   Lab Results  Component Value Date   WBC 10.5 11/09/2020   HGB 9.8 (L) 11/09/2020   HCT 29.7 (L) 11/09/2020   PLT 217 11/09/2020   GLUCOSE 157 (H) 11/16/2020   CHOL 147 08/09/2019   TRIG 82 08/09/2019   HDL 28 (L) 08/09/2019   LDLCALC 103 (H) 08/09/2019   ALT 13 11/09/2020   AST 16 11/09/2020   NA 137 11/16/2020   K 3.6 11/16/2020   CL 105 11/16/2020   CREATININE 1.15 (H) 11/16/2020   BUN 20 11/16/2020   CO2 23 11/16/2020   HGBA1C 5.5 11/03/2020   MRI pelvis 2/28: IMPRESSION: 1. Irregular right posterolateral bladder wall thickening with probable trabeculation. Appearance is similar to prior CT scan from 10/08/2020. Possible 12 mm intramural or subserosal bladder wall nodule noted anteriorly on the right. Imaging features remain concerning for neoplasm. 2. Bilateral mild pelvic sidewall lymphadenopathy, concerning for metastatic disease. 3. Abnormal endometrial cavity, measuring up to 2.5 cm in AP thickness. In the absence of symptoms, endometrial thickness of greater than 8 mm is considered abnormal in a postmenopausal female. Neoplasm a distinct concern. 4. Small gas bubble in the non dependent bladder lumen, likely related to recent instrumentation. In the absence of recent instrumentation infection would become a more likely possibility

## 2020-11-16 NOTE — Patient Instructions (Signed)
It was a pleasure meeting you today. I will call you once I have the biopsy results as well as your CT scan results to discuss next steps in treatment.   Please start taking Miralax. Start taking it once a day and if no bowel function in the next 2 days, increase to twice a day dosing.

## 2020-11-17 ENCOUNTER — Encounter: Payer: Self-pay | Admitting: Gynecologic Oncology

## 2020-11-17 DIAGNOSIS — R5381 Other malaise: Secondary | ICD-10-CM | POA: Insufficient documentation

## 2020-11-17 DIAGNOSIS — R1900 Intra-abdominal and pelvic swelling, mass and lump, unspecified site: Secondary | ICD-10-CM | POA: Insufficient documentation

## 2020-11-17 DIAGNOSIS — R599 Enlarged lymph nodes, unspecified: Secondary | ICD-10-CM | POA: Insufficient documentation

## 2020-11-17 LAB — CA 125: Cancer Antigen (CA) 125: 136 U/mL — ABNORMAL HIGH (ref 0.0–38.1)

## 2020-11-19 ENCOUNTER — Telehealth: Payer: Self-pay | Admitting: Oncology

## 2020-11-19 ENCOUNTER — Encounter: Payer: Self-pay | Admitting: Gynecologic Oncology

## 2020-11-19 DIAGNOSIS — R1900 Intra-abdominal and pelvic swelling, mass and lump, unspecified site: Secondary | ICD-10-CM

## 2020-11-19 NOTE — Telephone Encounter (Signed)
Called [name] (daughter) and scheduled an appointment with Dr. Alvy Bimler on 11/25/20 at 1:00 (12:30 arrival).  She verbalized understanding and agreement.

## 2020-11-19 NOTE — Progress Notes (Signed)
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 I am concerned that she may have metastatic disease outside of the pelvis given her symptomatology. She is scheduled for a CT scan on Monday and I have preemptively scheduled her with our medical oncologist for Wednesday. We will change this if CT findings do not show abdominal and/or chest disease.  The patient  was in the bathroom at the time of this conversation. I offered to call back about an hour later. I called both the cell phone and home phone number on file with no answer. Unable to leave a voicemail.  Valarie Cones MD

## 2020-11-20 ENCOUNTER — Encounter: Payer: Self-pay | Admitting: Hematology and Oncology

## 2020-11-20 DIAGNOSIS — C55 Malignant neoplasm of uterus, part unspecified: Secondary | ICD-10-CM | POA: Insufficient documentation

## 2020-11-23 ENCOUNTER — Ambulatory Visit (HOSPITAL_COMMUNITY)
Admission: RE | Admit: 2020-11-23 | Discharge: 2020-11-23 | Disposition: A | Discharge status: Home / Self Care | Payer: Medicare HMO | Source: Ambulatory Visit | Attending: Gynecologic Oncology | Admitting: Gynecologic Oncology

## 2020-11-23 ENCOUNTER — Other Ambulatory Visit: Payer: Self-pay

## 2020-11-23 DIAGNOSIS — R1900 Intra-abdominal and pelvic swelling, mass and lump, unspecified site: Secondary | ICD-10-CM | POA: Insufficient documentation

## 2020-11-23 LAB — SURGICAL PATHOLOGY

## 2020-11-23 IMAGING — CT CT CHEST W/ CM
2 of 5 series · 11 of 36 positions shown, 13 images · IV contrast (APPLIED)
Comparison: MR pelvis, [DATE]

CLINICAL DATA: Recent diagnosis high-grade endometrial cancer,
evaluate for metastatic disease

EXAM:
CT CHEST, ABDOMEN, AND PELVIS WITH CONTRAST
TECHNIQUE: Multidetector CT imaging of the chest, abdomen and pelvis was
performed following the standard protocol during bolus
administration of intravenous contrast.
CONTRAST:  100mL OMNIPAQUE IOHEXOL 300 MG/ML SOLN, additional oral
enteric contrast

[Series 2: cap with · axial · 0.65mm/px · z∈[-658,-194]mm · 8 of 115 slices shown, 10 images]
[im 11/115  mediastinal]
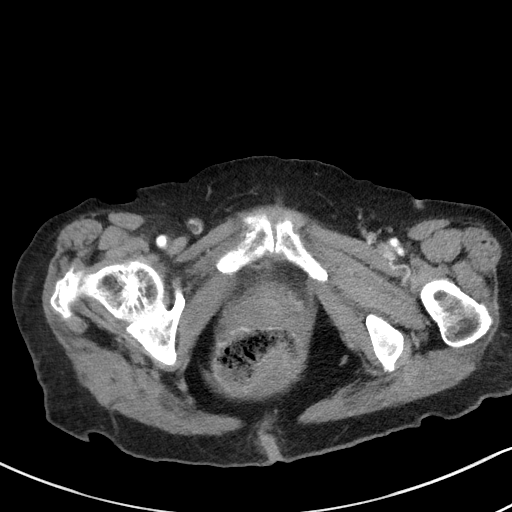
[im 11/115  lung]
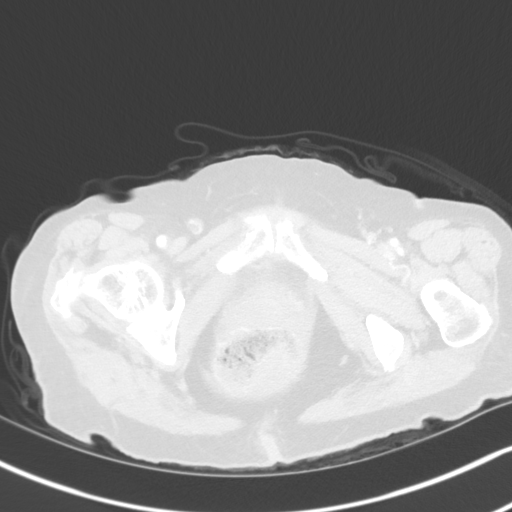
[im 21/115  lung]
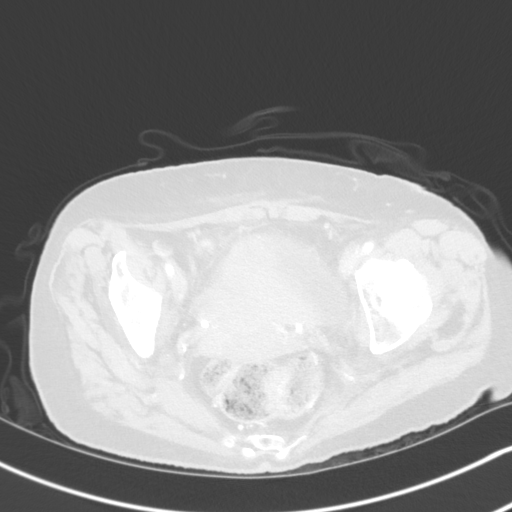
[im 42/115  lung]
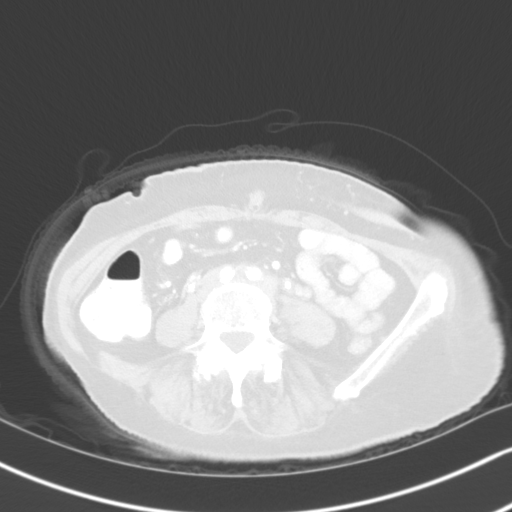
[im 52/115  lung]
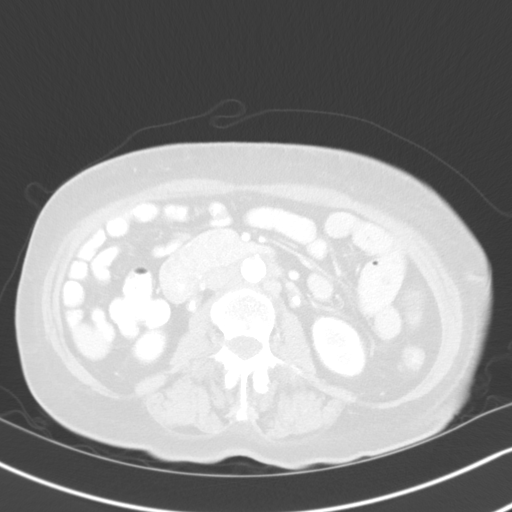
[im 63/115  mediastinal]
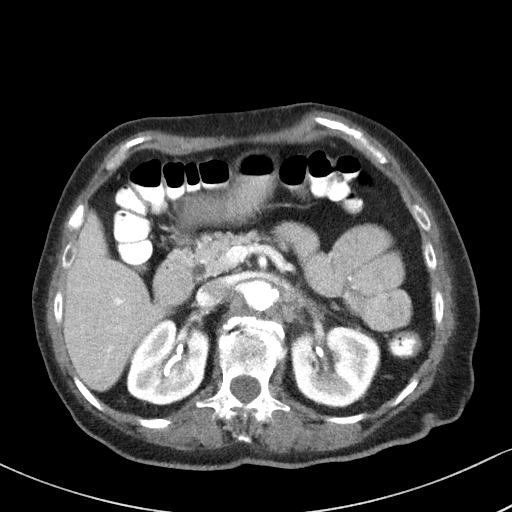
[im 63/115  lung]
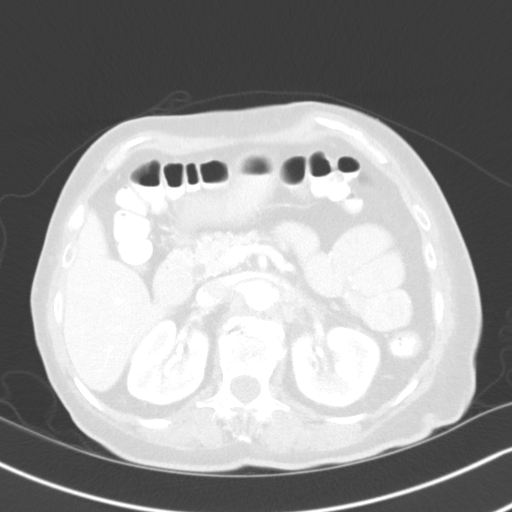
[im 73/115  lung]
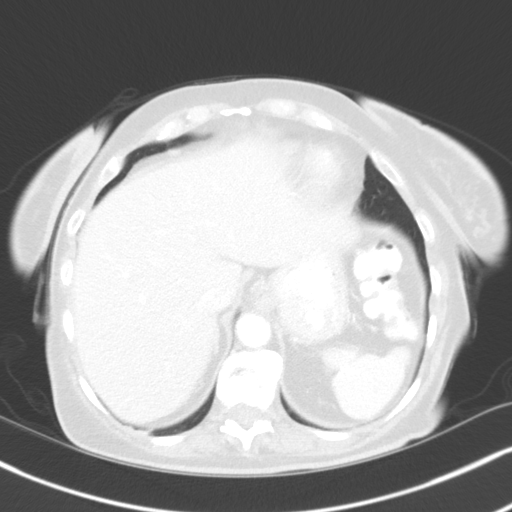
[im 94/115  lung]
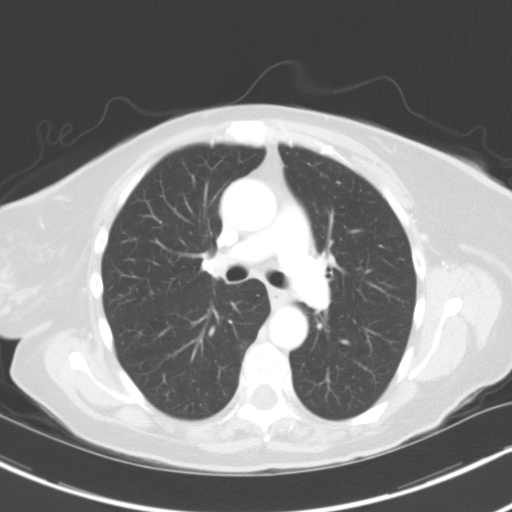
[im 104/115  lung]
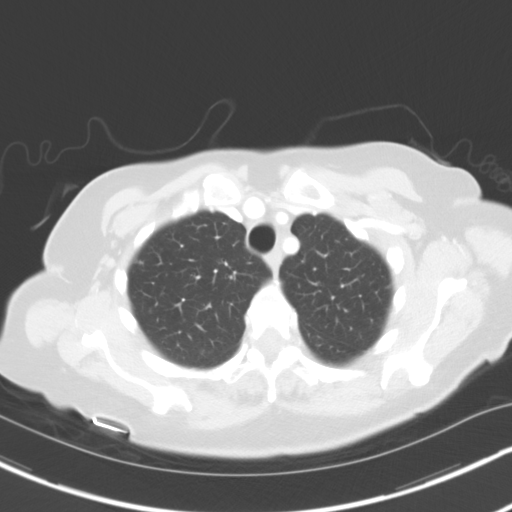

[Series 5: coronals · coronal · 0.67mm/px · 3 of 129 slices shown]
[im 26/129  lung]
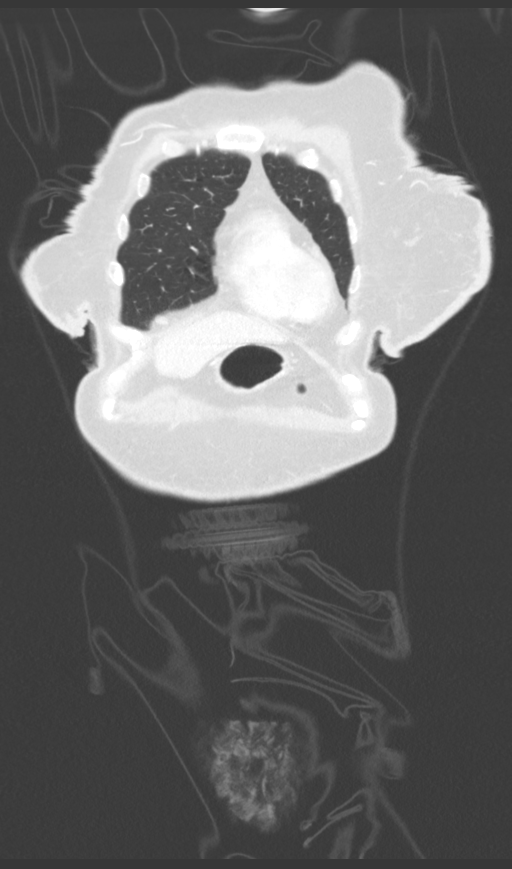
[im 52/129  lung]
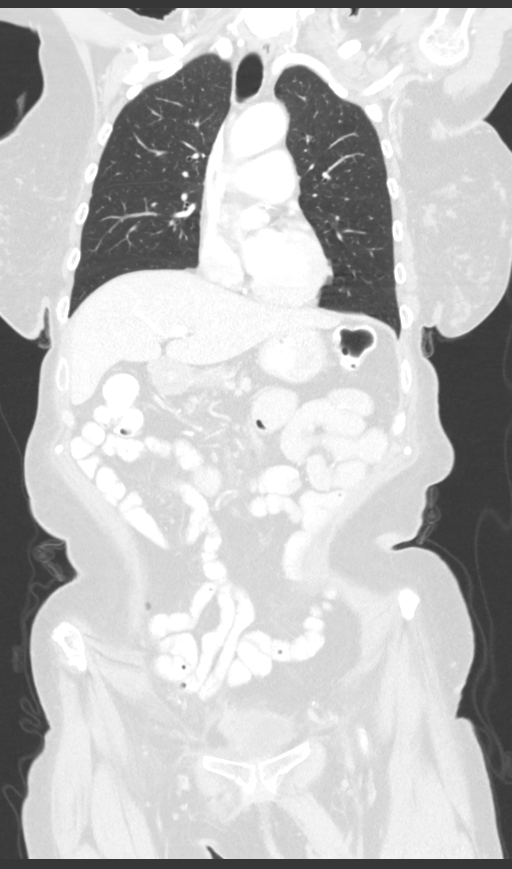
[im 77/129  lung]
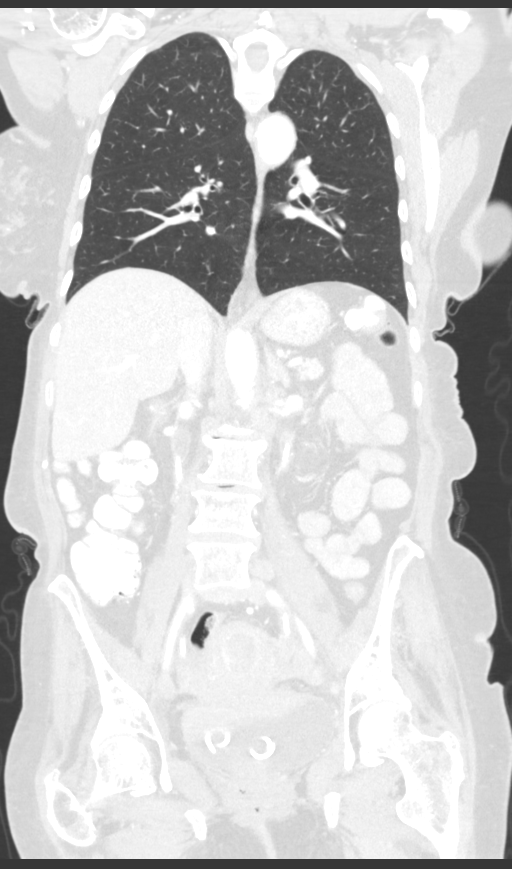

[11 of 36 positions shown; findings below may reference images not displayed]

FINDINGS: CT CHEST FINDINGS

Cardiovascular: Aortic atherosclerosis. Normal heart size.
Three-vessel coronary artery calcifications. No pericardial
effusion.

Mediastinum/Nodes: There are abnormal, matted appearing left
cervical and subpectoral lymph nodes, largest lower cervical node or
conglomerate measuring 3.5 x 2.3 cm, subpectoral nodes measuring up
to 1.5 x 1.4 cm (series 2, image 6, 8). Thyroid gland, trachea, and
esophagus demonstrate no significant findings.

Lungs/Pleura: Background of very fine centrilobular nodules
throughout the lungs, most concentrated in the lung apices. There
are occasional more discrete small pulmonary nodules, including a 4
mm nodule of the medial left lower lobe (series 4, image 66), a 2 mm
nodule of the anterior left upper lobe (series 4, image 58) and a 2
mm nodule of the posterior right upper lobe (series 4, image 38). No
pleural effusion or pneumothorax.

Musculoskeletal: No chest wall mass or suspicious bone lesions
identified.

CT ABDOMEN PELVIS FINDINGS

Hepatobiliary: No focal liver abnormality is seen. Status post
cholecystectomy. No biliary dilatation.

Pancreas: Unremarkable. No pancreatic ductal dilatation or
surrounding inflammatory changes.

Spleen: Normal in size without significant abnormality.

Adrenals/Urinary Tract: Bilateral soft tissue attenuation adrenal
gland nodules, measuring 2.2 x 1.7 cm on the left and 1.5 x 1.1 cm
on the right (series 2, image 46). There are bilateral double-J
ureteral stents with formed pigtails in the renal calices and
urinary bladder. There is mild persistent bilateral hydronephrosis.
Redemonstrated, ill-defined soft tissue thickening of the dome of
the bladder, contiguous with the adjacent lower uterine segment
(series 6, image 78).

Stomach/Bowel: Stomach is within normal limits. Appendix is not
clearly visualized. No evidence of bowel wall thickening,
distention, or inflammatory changes. Descending and sigmoid
diverticulosis.

Vascular/Lymphatic: Aortic atherosclerosis. Numerous abnormally
enlarged, matted appearing retroperitoneal, bilateral iliac, and
bilateral pelvic sidewall lymph nodes, largest aortocaval node
measuring 2.0 x 1.1 cm (series 2, image 57), largest right iliac
node measuring 1.8 x 1.7 cm (series 2, image 88).

Reproductive: Severe, masslike thickening of the endometrium,
measuring at least 2.7 cm (series 6, image 76). Ill-defined soft
tissue about the lower uterine segment and cervix.

Other: No abdominal wall hernia or abnormality. No abdominopelvic
ascites.

Musculoskeletal: No acute or significant osseous findings.
IMPRESSION: 1. Severe, masslike thickening of the endometrium,, with abnormal
soft tissue about the lower uterine segment and cervix and
contiguous thickening of the adjacent urinary bladder dome. Findings
are consistent with biopsy proven endometrial malignancy and in
general better evaluated by recent prior MRI.
2. Numerous abnormally enlarged, matted appearing retroperitoneal,
bilateral iliac, and bilateral pelvic sidewall lymph nodes,
consistent with abdominal and pelvic nodal metastatic disease.
3. Abnormal, matted appearing left cervical and subpectoral lymph
nodes, consistent with thoracic nodal metastatic disease.
4. Multiple small bilateral pulmonary nodules measuring 4 mm and
smaller, nonspecific but suspicious for pulmonary metastatic
disease. Attention on follow-up.
5. Bilateral soft tissue attenuation adrenal gland nodules, as seen
on prior examinations, statistically most likely to reflect
incidental adrenal adenomata but nonspecific and metastatic disease
is not strictly excluded. Attention on follow-up.
6. Background of very fine centrilobular nodules throughout the
lungs, likely reflecting smoking-related respiratory bronchiolitis.
7. There are bilateral double-J ureteral stents with formed pigtails
in the renal calices and urinary bladder. There is mild persistent
bilateral hydronephrosis.
8. Coronary artery disease.

Aortic Atherosclerosis ([WM]-[WM]).

## 2020-11-23 MED ORDER — IOHEXOL 300 MG/ML  SOLN
100.0000 mL | Freq: Once | INTRAMUSCULAR | Status: AC | PRN
  Administered 2020-11-23: 100 mL via INTRAVENOUS

## 2020-11-24 ENCOUNTER — Telehealth: Payer: Self-pay | Admitting: *Deleted

## 2020-11-24 ENCOUNTER — Telehealth: Payer: Self-pay | Admitting: Gynecologic Oncology

## 2020-11-24 DIAGNOSIS — C541 Malignant neoplasm of endometrium: Secondary | ICD-10-CM

## 2020-11-24 MED ORDER — SENNA 8.6 MG PO TABS: 1.0000 | ORAL_TABLET | Freq: Every evening | ORAL | 0 refills | Status: AC | PRN

## 2020-11-24 MED ORDER — ONDANSETRON HCL 8 MG PO TABS: 8.0000 mg | ORAL_TABLET | Freq: Three times a day (TID) | ORAL | 0 refills | Status: AC | PRN

## 2020-11-24 MED ORDER — OXYCODONE HCL 5 MG PO CAPS: 5.0000 mg | ORAL_CAPSULE | ORAL | 0 refills | Status: AC | PRN

## 2020-11-24 NOTE — Telephone Encounter (Signed)
Called and spoke with Jacqueline Leach (patient's daughter; Jacqueline Leach was sleeping) about CT findings. Given stage IV disease, Jacqueline Leach is not a good upfront surgical candidate. I continue to be concerned about her deconditioning.  Jacqueline Leach asked about Hospice given increasing pain and nausea. I offered to send in prescriptions for pain medication and an anti-emetic. She will take to her mother when awake about coming in for tomorrow's visit. I made it very clear that given her worsening performance status, it is possible she will not be a candidate for treatment.    They will call me back and let me know how they'd like to proceed. If Jacqueline Leach does not want or is not strong enough to come tomorrow, then we will move forward with a Hospice referral per their request.  We also discussed the possibility of genetic testing given Jacqueline Leach's brother had pancreatic cancer (raises concern for a possible BRCA1 mutation in the setting of uterine serous cancer).  KTucker MD

## 2020-11-24 NOTE — Telephone Encounter (Signed)
Called and spoke with the patient's daughter, scheduled genetics/labs for 3/29

## 2020-11-25 ENCOUNTER — Telehealth: Payer: Self-pay | Admitting: Oncology

## 2020-11-25 ENCOUNTER — Inpatient Hospital Stay: Payer: Medicare HMO | Admitting: Hematology and Oncology

## 2020-11-25 NOTE — Telephone Encounter (Signed)
Called and spoke to Jacqueline Leach (daughter) regarding Dr. Calton Dach appointment today.  Jacqueline Leach, her other daughter, was there as well,  They talked with Simrit and they decided to cancel today's appointment because they do not think she will be able to tolerate chemotherapy.  Jacqueline Leach said Jacqueline Leach has lost a lot of weight and is sleeping a lot.  They are going to discuss the hospice referral over the weekend and would like [place] to call on Monday for their decision.

## 2020-11-30 ENCOUNTER — Telehealth: Payer: Self-pay | Admitting: Oncology

## 2020-11-30 DIAGNOSIS — C55 Malignant neoplasm of uterus, part unspecified: Secondary | ICD-10-CM

## 2020-11-30 NOTE — Telephone Encounter (Signed)
Called [name] regarding their decision about treatment or hospice.  She said Icey was asleep now and would like to call back when she is awake.  Gave her my phone number and she said she will try to call back latter today.

## 2020-12-02 NOTE — Telephone Encounter (Signed)
Called hospice referral to [place] with [facility].  She is going to call Jacqueline Leach this afternoon to set up an appointment.

## 2020-12-02 NOTE — Telephone Encounter (Signed)
Jacqueline Leach (daughter) called and would like a referral for hospice called in to [facility] for [place].  Advised her that I will call the referral in and a hospice nurse will be contacting her to set up an appointment.

## 2020-12-08 ENCOUNTER — Encounter: Payer: Medicare HMO | Admitting: Genetic Counselor

## 2020-12-08 ENCOUNTER — Other Ambulatory Visit: Payer: Medicare HMO

## 2023-08-31 LAB — MOLECULAR PATHOLOGY
